# Patient Record
Sex: Male | Born: 1955 | Race: Black or African American | Hispanic: No | Marital: Married | Smoking: Never smoker
Health system: Southern US, Community
[De-identification: ages and names within clinical notes are randomized; demographics above are authoritative.]

## PROBLEM LIST (undated history)

## (undated) DIAGNOSIS — T7840XA Allergy, unspecified, initial encounter: Secondary | ICD-10-CM

## (undated) DIAGNOSIS — I351 Nonrheumatic aortic (valve) insufficiency: Secondary | ICD-10-CM

## (undated) DIAGNOSIS — K219 Gastro-esophageal reflux disease without esophagitis: Secondary | ICD-10-CM

## (undated) DIAGNOSIS — I10 Essential (primary) hypertension: Secondary | ICD-10-CM

## (undated) DIAGNOSIS — N189 Chronic kidney disease, unspecified: Secondary | ICD-10-CM

## (undated) DIAGNOSIS — M1990 Unspecified osteoarthritis, unspecified site: Secondary | ICD-10-CM

## (undated) DIAGNOSIS — J329 Chronic sinusitis, unspecified: Secondary | ICD-10-CM

## (undated) DIAGNOSIS — R011 Cardiac murmur, unspecified: Secondary | ICD-10-CM

## (undated) HISTORY — PX: LASIK: SHX215

## (undated) HISTORY — PX: KNEE ARTHROSCOPY: SUR90

## (undated) HISTORY — DX: Allergy, unspecified, initial encounter: T78.40XA

## (undated) HISTORY — PX: COLONOSCOPY: SHX174

## (undated) HISTORY — DX: Gastro-esophageal reflux disease without esophagitis: K21.9

## (undated) HISTORY — DX: Nonrheumatic aortic (valve) insufficiency: I35.1

## (undated) HISTORY — DX: Chronic sinusitis, unspecified: J32.9

---

## 1998-11-09 ENCOUNTER — Encounter: Payer: Self-pay | Admitting: Family Medicine

## 1998-11-09 ENCOUNTER — Ambulatory Visit (HOSPITAL_COMMUNITY): Admission: RE | Admit: 1998-11-09 | Discharge: 1998-11-09 | Payer: Self-pay | Admitting: Family Medicine

## 2013-12-28 ENCOUNTER — Emergency Department (HOSPITAL_BASED_OUTPATIENT_CLINIC_OR_DEPARTMENT_OTHER)
Admission: EM | Admit: 2013-12-28 | Discharge: 2013-12-28 | Disposition: A | Discharge status: Home / Self Care | Payer: Managed Care, Other (non HMO) | Attending: Emergency Medicine | Admitting: Emergency Medicine

## 2013-12-28 ENCOUNTER — Encounter (HOSPITAL_BASED_OUTPATIENT_CLINIC_OR_DEPARTMENT_OTHER): Payer: Self-pay | Admitting: Emergency Medicine

## 2013-12-28 DIAGNOSIS — S61213A Laceration without foreign body of left middle finger without damage to nail, initial encounter: Secondary | ICD-10-CM

## 2013-12-28 DIAGNOSIS — Y929 Unspecified place or not applicable: Secondary | ICD-10-CM | POA: Insufficient documentation

## 2013-12-28 DIAGNOSIS — S61209A Unspecified open wound of unspecified finger without damage to nail, initial encounter: Secondary | ICD-10-CM | POA: Insufficient documentation

## 2013-12-28 DIAGNOSIS — Y939 Activity, unspecified: Secondary | ICD-10-CM | POA: Insufficient documentation

## 2013-12-28 DIAGNOSIS — W260XXA Contact with knife, initial encounter: Secondary | ICD-10-CM | POA: Insufficient documentation

## 2013-12-28 DIAGNOSIS — W261XXA Contact with sword or dagger, initial encounter: Secondary | ICD-10-CM

## 2013-12-28 DIAGNOSIS — Z23 Encounter for immunization: Secondary | ICD-10-CM | POA: Insufficient documentation

## 2013-12-28 MED ORDER — TETANUS-DIPHTH-ACELL PERTUSSIS 5-2.5-18.5 LF-MCG/0.5 IM SUSP
0.5000 mL | Freq: Once | INTRAMUSCULAR | Status: AC
  Administered 2013-12-28: 0.5 mL via INTRAMUSCULAR
  Filled 2013-12-28: qty 0.5

## 2013-12-28 NOTE — ED Notes (Signed)
Pt reports he cut his left middle finger while sharpening a knife

## 2013-12-28 NOTE — ED Provider Notes (Addendum)
CSN: [id number]     Arrival date & time 12/28/13  1800 History   First MD Initiated Contact with Patient 12/28/13 1812     Chief Complaint  Patient presents with  . Laceration   (Consider location/radiation/quality/duration/timing/severity/associated sxs/prior Treatment) Patient is a 58 y.o. male presenting with skin laceration. The history is provided by the patient. No language interpreter was used.  Laceration Location:  Hand Hand laceration location:  L finger Length (cm):  1.5 cm Depth:  Through dermis Quality: straight   Bleeding: controlled   Time since incident:  1 hour Laceration mechanism:  Knife Pain details:    Quality:  Aching   Severity:  Mild   Timing:  Constant   Progression:  Unchanged Foreign body present:  No foreign bodies Relieved by:  Pressure Worsened by:  Nothing tried Ineffective treatments:  None tried Tetanus status:  Unknown   History reviewed. No pertinent past medical history. History reviewed. No pertinent past surgical history. No family history on file. History  Substance Use Topics  . Smoking status: Never Smoker   . Smokeless tobacco: Not on file  . Alcohol Use: Yes     Comment: occasional    Review of Systems  Constitutional: Negative for fever, chills and fatigue.  HENT: Negative for trouble swallowing.   Eyes: Negative for visual disturbance.  Respiratory: Negative for shortness of breath.   Cardiovascular: Negative for chest pain and palpitations.  Gastrointestinal: Negative for nausea, vomiting, abdominal pain and diarrhea.  Genitourinary: Negative for dysuria and difficulty urinating.  Musculoskeletal: Negative for arthralgias and neck pain.  Skin: Positive for wound. Negative for color change.  Neurological: Negative for dizziness and weakness.  Psychiatric/Behavioral: Negative for dysphoric mood.    Allergies  Review of patient's allergies indicates no known allergies.  Home Medications  No current outpatient  prescriptions on file. BP 146/58  Pulse 81  Temp(Src) 98.6 F (37 C) (Oral)  Resp 16  Ht 5\' 10"  (1.778 m)  Wt 210 lb (95.255 kg)  BMI 30.13 kg/m2  SpO2 100% Physical Exam  Nursing note and vitals reviewed. Constitutional: He appears well-developed and well-nourished. No distress.  HENT:  Head: Normocephalic and atraumatic.  Eyes: Conjunctivae are normal.  Neck: Normal range of motion.  Cardiovascular: Normal rate and regular rhythm.  Exam reveals no gallop and no friction rub.   No murmur heard. Pulmonary/Chest: Effort normal and breath sounds normal. He has no wheezes. He has no rales. He exhibits no tenderness.  Musculoskeletal: Normal range of motion.  Full ROM of left middle finger. No obvious deformity.   Neurological: He is alert. Coordination normal.  Sensation intact of distal left middle finger.   Skin: Skin is warm and dry.  1.5cm laceration over PIP joint of left middle finger. No foreign bodies noted.   Psychiatric: He has a normal mood and affect. His behavior is normal.    ED Course  Procedures (including critical care time)   LACERATION REPAIR Performed by: Emilia Beck Authorized by: Emilia Beck Consent: Verbal consent obtained. Risks and benefits: risks, benefits and alternatives were discussed Consent given by: patient Patient identity confirmed: provided demographic data Prepped and Draped in normal sterile fashion Wound explored  Laceration Location: PIP joint of left middle finger  Laceration Length: 1.5 cm  No Foreign Bodies seen or palpated  Anesthesia: local infiltration  Local anesthetic: lidocaine 2% without epinephrine  Anesthetic total: 2 ml  Irrigation method: syringe Amount of cleaning: standard  Skin closure: 4-0 prolene  Number  of sutures: 2  Technique: simple  Patient tolerance: Patient tolerated the procedure well with no immediate complications.   Labs Review Labs Reviewed - No data to display Imaging  Review No results found.  EKG Interpretation   None       MDM   1. Laceration of left middle finger w/o foreign body w/o damage to nail     6:46 PM Laceration repaired without difficulty. Patient will have tetanus shot. Patient advised to return in 10 days for suture removal. No other injuries. No neurovascular compromise of affected finger.    Emilia BeckKaitlyn Marcianne Ozbun, PA-C 12/28/13 1851  Emilia BeckKaitlyn Paulene Tayag, PA-C 01/03/14 1707

## 2013-12-28 NOTE — Discharge Instructions (Signed)
Keep wound clean. Return to the ED in 10 days for suture removal. Refer to attached documents for more information.  °

## 2013-12-29 NOTE — ED Provider Notes (Signed)
Medical screening examination/treatment/procedure(s) were performed by non-physician practitioner and as supervising physician I was immediately available for consultation/collaboration.  EKG Interpretation   None         Junius ArgyleForrest S Wei Newbrough, MD 12/29/13 1308

## 2014-01-03 NOTE — ED Provider Notes (Signed)
Medical screening examination/treatment/procedure(s) were performed by non-physician practitioner and as supervising physician I was immediately available for consultation/collaboration.  EKG Interpretation   None         Sheretha Shadd S Zaheer Wageman, MD 01/03/14 2255 

## 2014-01-10 ENCOUNTER — Emergency Department (HOSPITAL_BASED_OUTPATIENT_CLINIC_OR_DEPARTMENT_OTHER)
Admission: EM | Admit: 2014-01-10 | Discharge: 2014-01-10 | Disposition: A | Discharge status: Home / Self Care | Payer: Managed Care, Other (non HMO) | Attending: Emergency Medicine | Admitting: Emergency Medicine

## 2014-01-10 ENCOUNTER — Encounter (HOSPITAL_BASED_OUTPATIENT_CLINIC_OR_DEPARTMENT_OTHER): Payer: Self-pay | Admitting: Emergency Medicine

## 2014-01-10 ENCOUNTER — Emergency Department (HOSPITAL_BASED_OUTPATIENT_CLINIC_OR_DEPARTMENT_OTHER): Payer: Managed Care, Other (non HMO)

## 2014-01-10 DIAGNOSIS — Z4802 Encounter for removal of sutures: Secondary | ICD-10-CM | POA: Insufficient documentation

## 2014-01-10 DIAGNOSIS — M20029 Boutonniere deformity of unspecified finger(s): Secondary | ICD-10-CM | POA: Insufficient documentation

## 2014-01-10 DIAGNOSIS — M20022 Boutonniere deformity of left finger(s): Secondary | ICD-10-CM

## 2014-01-10 NOTE — ED Notes (Signed)
Pt presents for suture removal in the L middle finger

## 2014-01-10 NOTE — ED Provider Notes (Signed)
CSN: [id number]     Arrival date & time 01/10/14  1630 History   First MD Initiated Contact with Patient 01/10/14 1653     Chief Complaint  Patient presents with  . Suture / Staple Removal     (Consider location/radiation/quality/duration/timing/severity/associated sxs/prior Treatment) HPI Comments: Patient presents for suture removal from the left middle finger. He received sutures on 2/7 after a cut from a knife.  He denies any fevers, bleeding or drainage. He has swelling to the PIP joint at the site of the sutures.  The history is provided by the patient.    History reviewed. No pertinent past medical history. History reviewed. No pertinent past surgical history. History reviewed. No pertinent family history. History  Substance Use Topics  . Smoking status: Never Smoker   . Smokeless tobacco: Not on file  . Alcohol Use: Yes     Comment: occasional    Review of Systems  Constitutional: Negative for activity change and appetite change.  HENT: Negative for congestion.   Respiratory: Negative for cough, chest tightness and shortness of breath.   Cardiovascular: Negative for chest pain.  Gastrointestinal: Negative for nausea, vomiting and abdominal pain.  Genitourinary: Negative for dysuria and hematuria.  Musculoskeletal: Negative for arthralgias and myalgias.  Skin: Positive for wound.  A complete 10 system review of systems was obtained and all systems are negative except as noted in the HPI and PMH.      Allergies  Review of patient's allergies indicates no known allergies.  Home Medications  No current outpatient prescriptions on file. BP 146/68  Pulse 95  Temp(Src) 99.5 F (37.5 C) (Oral)  Resp 20  Ht 5\' 10"  (1.778 m)  Wt 210 lb (95.255 kg)  BMI 30.13 kg/m2  SpO2 99% Physical Exam  Constitutional: He appears well-developed and well-nourished. No distress.  HENT:  Head: Normocephalic and atraumatic.  Mouth/Throat: Oropharynx is clear and moist. No  oropharyngeal exudate.  Eyes: Conjunctivae and EOM are normal. Pupils are equal, round, and reactive to light.  Neck: Normal range of motion. Neck supple.  Cardiovascular: Normal rate, regular rhythm and normal heart sounds.   Pulmonary/Chest: Effort normal and breath sounds normal. No respiratory distress.  Abdominal: Soft. There is no tenderness. There is no rebound and no guarding.  Musculoskeletal: He exhibits edema and tenderness.  There is swelling of the PIP joint of the left middle finger. Sutures are intact without erythema or drainage. Patient is able to flex but cannot fully extend PIP joint. He is unable to flex the DIP joint which is held in hyperextension. This is consistent with the boutonniere deformity.  Neurological: He is alert. No cranial nerve deficit. He exhibits normal muscle tone. Coordination normal.  Skin: Skin is warm.    ED Course  SUTURE REMOVAL Date/Time: 01/10/2014 5:27 PM Performed by: Glynn OctaveANCOUR, Art Levan Authorized by: Glynn OctaveANCOUR, Tanishia Lemaster Consent: Verbal consent obtained. Risks and benefits: risks, benefits and alternatives were discussed Consent given by: patient Time out: Immediately prior to procedure a "time out" was called to verify the correct patient, procedure, equipment, support staff and site/side marked as required. Body area: upper extremity Location details: left long finger Wound Appearance: clean Sutures Removed: 3 Post-removal: dressing applied Facility: sutures placed in this facility Patient tolerance: Patient tolerated the procedure well with no immediate complications.   (including critical care time) Labs Review Labs Reviewed - No data to display Imaging Review Dg Finger Middle Left  01/10/2014   CLINICAL DATA:  Swelling of the left third digit,  prior suture to repair laceration  EXAM: LEFT MIDDLE FINGER 2+V  COMPARISON:  None.  FINDINGS: No bony abnormality is seen. The does appear to be soft tissue swelling particularly over the left  third PIP joint. Joint spaces appear normal. There is flexion of the left third DIP joint indicative of a boutonniere deformity.  IMPRESSION: No acute bony abnormality. Flexion of the left third DIP joint indicates a boutonniere deformity.   Electronically Signed   By: Dwyane Dee M.D.   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 Sutures are removed. There is concern for extensor tendon injury given patient's boutonniere deformity. This was not documented the time of his injury. he denies any additional trauma.  This was discussed with Dr. Melvyn Novas of hand surgery. Will place finger in full extension.  Patient instructed finger will need to be splinted for at least 6 weeks. Dr. Melvyn Novas will see him in followup on 2/24.    Glynn Octave, MD 01/10/14 [phone]

## 2014-01-10 NOTE — Discharge Instructions (Signed)
Suture Removal, Care After Keep your finger straight in the splint until you followup with Dr. Melvyn Novasrtmann on the 24th. He probably has a injury to the tendon in that finger which is why he can't straighten it.  Return to the ED if you develop new or worsening symptoms. Refer to this sheet in the next few weeks. These instructions provide you with information on caring for yourself after your procedure. Your health care provider may also give you more specific instructions. Your treatment has been planned according to current medical practices, but problems sometimes occur. Call your health care provider if you have any problems or questions after your procedure. WHAT TO EXPECT AFTER THE PROCEDURE After your stitches (sutures) are removed, it is typical to have the following:  Some discomfort and swelling in the wound area.  Slight redness in the area. HOME CARE INSTRUCTIONS   If you have skin adhesive strips over the wound area, do not take the strips off. They will fall off on their own in a few days. If the strips remain in place after 14 days, you may remove them.  Change any bandages (dressings) at least once a day or as directed by your health care provider. If the bandage sticks, soak it off with warm, soapy water.  Apply cream or ointment only as directed by your health care provider. If using cream or ointment, wash the area with soap and water 2 times a day to remove all the cream or ointment. Rinse off the soap and pat the area dry with a clean towel.  Keep the wound area dry and clean. If the bandage becomes wet or dirty, or if it develops a bad smell, change it as soon as possible.  Continue to protect the wound from injury.  Use sunscreen when out in the sun. New scars become sunburned easily. SEEK MEDICAL CARE IF:  You have increasing redness, swelling, or pain in the wound.  You see pus coming from the wound.  You have a fever.  You notice a bad smell coming from the wound  or dressing.  Your wound breaks open (edges not staying together). Document Released: 08/02/2001 Document Revised: 08/28/2013 Document Reviewed: 06/19/2013 [facility] Patient Information 2014 [place], [place].

## 2014-07-10 ENCOUNTER — Ambulatory Visit (HOSPITAL_COMMUNITY): Payer: Managed Care, Other (non HMO) | Attending: Cardiovascular Disease | Admitting: Radiology

## 2014-07-10 ENCOUNTER — Other Ambulatory Visit (HOSPITAL_COMMUNITY): Payer: Self-pay | Admitting: Family Medicine

## 2014-07-10 DIAGNOSIS — I351 Nonrheumatic aortic (valve) insufficiency: Secondary | ICD-10-CM

## 2014-07-10 DIAGNOSIS — R011 Cardiac murmur, unspecified: Secondary | ICD-10-CM | POA: Diagnosis present

## 2014-07-10 DIAGNOSIS — I517 Cardiomegaly: Secondary | ICD-10-CM | POA: Insufficient documentation

## 2014-07-10 DIAGNOSIS — I359 Nonrheumatic aortic valve disorder, unspecified: Secondary | ICD-10-CM | POA: Insufficient documentation

## 2014-07-10 HISTORY — DX: Nonrheumatic aortic (valve) insufficiency: I35.1

## 2014-07-10 NOTE — Progress Notes (Signed)
Echocardiogram performed.  

## 2014-07-29 ENCOUNTER — Encounter: Payer: Self-pay | Admitting: *Deleted

## 2014-07-29 DIAGNOSIS — T7840XA Allergy, unspecified, initial encounter: Secondary | ICD-10-CM | POA: Insufficient documentation

## 2014-07-29 DIAGNOSIS — J329 Chronic sinusitis, unspecified: Secondary | ICD-10-CM | POA: Insufficient documentation

## 2014-07-29 DIAGNOSIS — K219 Gastro-esophageal reflux disease without esophagitis: Secondary | ICD-10-CM | POA: Insufficient documentation

## 2014-07-30 ENCOUNTER — Institutional Professional Consult (permissible substitution) (INDEPENDENT_AMBULATORY_CARE_PROVIDER_SITE_OTHER): Payer: Managed Care, Other (non HMO) | Admitting: Surgery

## 2014-07-30 ENCOUNTER — Encounter: Payer: Self-pay | Admitting: Surgery

## 2014-07-30 VITALS — BP 121/71 | HR 75 | Ht 70.0 in | Wt 210.0 lb

## 2014-07-30 DIAGNOSIS — I351 Nonrheumatic aortic (valve) insufficiency: Secondary | ICD-10-CM

## 2014-07-30 DIAGNOSIS — I359 Nonrheumatic aortic valve disorder, unspecified: Secondary | ICD-10-CM

## 2014-08-01 ENCOUNTER — Encounter: Payer: Self-pay | Admitting: Surgery

## 2014-08-01 NOTE — Progress Notes (Signed)
Cardiothoracic Surgery Consultation   PCP is Lolita Patella, MD Referring Provider is Eric Else, MD  Chief Complaint  Patient presents with  . NEW CARDIACIC    EVAL SEVER AORTIC VALVE REGURGATION    HPI:  The patient is a 58 year old gentleman in good health who recently saw Dr. Nicholos Johns for his routine health maintenance exam and was noted to have a murmur. An echocardiogram showed severe AI with a normal LVEF of 60-65%. There was mild LV cavity dilatation with an LV ID ED of 55 mm and an LV ID ES of 33 mm. He denies any symptoms and says that he exercises several days per week with no limitation.  Past Medical History  Diagnosis Date  . Aortic valve regurgitation 07/10/14    ECHO @ [facility]  . GERD (gastroesophageal reflux disease)     CURRENTLY DIET CONTROLLED  . Allergy     ALLERGIC RHINITIS..WORSE SPRING/FALL  . Sinusitis, chronic     History reviewed. No pertinent past surgical history.  Family History  Problem Relation Age of Onset  . Hypertension Mother   . Cancer Father     STOMACH  . Diabetes Daughter   . Cancer Maternal Grandmother     STOMACH  . Heart disease Maternal Grandmother   . Cancer Brother     COLON  . Diabetes Brother     Social History History  Substance Use Topics  . Smoking status: Never Smoker   . Smokeless tobacco: Not on file  . Alcohol Use: Yes     Comment: occasional    No current outpatient prescriptions on file.   No current facility-administered medications for this visit.    No Known Allergies  Review of Systems  Constitutional: Negative for fever, chills, activity change, fatigue and unexpected weight change.  HENT: Negative.   Eyes: Negative.   Respiratory: Negative for cough, chest tightness and shortness of breath.   Cardiovascular: Negative for chest pain, palpitations and leg swelling.  Gastrointestinal: Negative.   Endocrine: Negative.   Genitourinary: Negative.   Musculoskeletal: Negative.    Skin: Negative.   Neurological: Negative.   Hematological: Negative.   Psychiatric/Behavioral: Negative.     BP 121/71  Pulse 75  Ht  (1.778 m)  Wt 210 lb (95.255 kg)  BMI 30.13 kg/m2  SpO2 98% Physical Exam  Constitutional: He is oriented to person, place, and time. He appears well-developed and well-nourished. No distress.  HENT:  Head: Normocephalic and atraumatic.  Mouth/Throat: Oropharynx is clear and moist.  Eyes: EOM are normal. Pupils are equal, round, and reactive to light.  Neck: Normal range of motion. Neck supple. No JVD present. No thyromegaly present.  Cardiovascular: Normal rate, regular rhythm and intact distal pulses.   Murmur heard. 2/6 diastolic murmur of AI loudest at the apex  Pulmonary/Chest: Effort normal and breath sounds normal. No respiratory distress. He has no wheezes. He has no rales.  Abdominal: Soft. Bowel sounds are normal. He exhibits no distension and no mass. There is no tenderness.  Musculoskeletal: He exhibits no edema.  Lymphadenopathy:    He has no cervical adenopathy.  Neurological: He is alert and oriented to person, place, and time. He has normal strength. No cranial nerve deficit or sensory deficit.  Skin: Skin is warm and dry.  Psychiatric: He has a normal mood and affect.     Diagnostic Tests:  [facility] [address] [place], [place] [address] [phone]  ------------------------------------------------------------------- Echocardiography  Patient: Eric [name], Eric [name] MR #: [id number] Study Date: 07/10/2014 Gender: M Age: 75 Height: 177.8 cm Weight: 93.9 kg BSA: 2.18 m^2 Pt. Status: Room:  ATTENDING Kristeen Miss, M.D. SONOGRAPHER Junious Dresser, RDCS ORDERING Eric [name] [id number] Mila Palmer [id number] PERFORMING [facility]  cc:  ------------------------------------------------------------------- LV EF: 60% -  65%  ------------------------------------------------------------------- Indications: Murmur 785.2.  ------------------------------------------------------------------- History: PMH: Acquired from the patient and from the patient&'s chart. 2/6 Systolic murmur at left sternal border with radiation to axilla. Risk factors: Obese. Dyslipidemia.  ------------------------------------------------------------------- Study Conclusions  - Left ventricle: The cavity size was moderately dilated. Wall thickness was increased in a pattern of moderate LVH. Systolic function was normal. The estimated ejection fraction was in the range of 60% to 65%. - Aortic valve: There was severe regurgitation. Mean gradient (S): 11 mm Hg. Peak gradient (S): 20 mm Hg. - Left atrium: The atrium was mildly dilated. - Pulmonary arteries: Systolic pressure was mildly increased. PA peak pressure: 37 mm Hg (S).  Echocardiography. M-mode, complete 2D, spectral Doppler, and color Doppler. Birthdate: Patient birthdate: [birth year]. Age: Patient is 58 yr old. Sex: Gender: male. BMI: 29.7 kg/m^2. Blood pressure: 120/75 Patient status: Outpatient. Study date: Study date: 07/10/2014. Study time: 02:35 PM. Location: [facility]  -------------------------------------------------------------------  ------------------------------------------------------------------- Left ventricle: The cavity size was moderately dilated. Wall thickness was increased in a pattern of moderate LVH. Systolic function was normal. The estimated ejection fraction was in the range of 60% to 65%.  ------------------------------------------------------------------- Aortic valve: Structurally normal valve. Cusp separation was normal. Doppler: Transvalvular velocity was within the normal range. There was no stenosis. There was severe regurgitation. VTI ratio of LVOT to aortic valve: 0.6. Valve area (VTI): 2.49 cm^2. Indexed valve area (VTI):  1.14 cm^2/m^2. Peak velocity ratio of LVOT to aortic valve: 0.59. Valve area (Vmax): 2.46 cm^2. Indexed valve area (Vmax): 1.13 cm^2/m^2. Mean velocity ratio of LVOT to aortic valve: 0.62. Valve area (Vmean): 2.57 cm^2. Indexed valve area (Vmean): 1.18 cm^2/m^2. Mean gradient (S): 11 mm Hg. Peak gradient (S): 20 mm Hg.  ------------------------------------------------------------------- Aorta: Aortic root: The aortic root was normal in size. Ascending aorta: The ascending aorta was mildly dilated.  ------------------------------------------------------------------- Mitral valve: Doppler: Peak gradient (D): 4 mm Hg.  ------------------------------------------------------------------- Left atrium: The atrium was mildly dilated.  ------------------------------------------------------------------- Right ventricle: The cavity size was normal. Systolic function was normal.  ------------------------------------------------------------------- Pulmonic valve: Structurally normal valve. Cusp separation was normal. Doppler: Transvalvular velocity was within the normal range. There was no regurgitation.  ------------------------------------------------------------------- Tricuspid valve: Structurally normal valve. Leaflet separation was normal. Doppler: Transvalvular velocity was within the normal range. There was trivial regurgitation.  ------------------------------------------------------------------- Pulmonary artery: Systolic pressure was mildly increased.  ------------------------------------------------------------------- Right atrium: The atrium was normal in size.  ------------------------------------------------------------------- Pericardium: There was no pericardial effusion.  ------------------------------------------------------------------- Measurements  Left ventricle Value Reference LV ID, ED, PLAX chordal (H) 54.7 mm 43 - 52 LV ID, ES, PLAX chordal 33 mm 23 - 38 LV  fx shortening, PLAX chordal 40 % >=29 LV PW thickness, ED 13.1 mm --------- IVS/LV PW ratio, ED 1.11 <=1.3 Stroke volume, 2D 102 ml --------- Stroke volume/bsa, 2D 47 ml/m^2 --------- LV ejection fraction, 1-p A4C 58 % --------- LV end-diastolic volume, 2-p 232 ml --------- LV end-systolic volume, 2-p 99 ml --------- LV ejection fraction, 2-p 57 % --------- Stroke volume, 2-p 133 ml --------- LV end-diastolic volume/bsa, 2-p 107 ml/m^2 --------- LV end-systolic volume/bsa, 2-p 45 ml/m^2 --------- Stroke volume/bsa, 2-p 61.1 ml/m^2 --------- LV e&',  lateral 9.55 cm/s --------- LV E/e&', lateral 9.87 --------- LV e&', medial 8.58 cm/s --------- LV E/e&', medial 10.99 --------- LV e&', average 9.07 cm/s --------- LV E/e&', average 10.4 ---------  Ventricular septum Value Reference IVS thickness, ED 14.5 mm ---------  LVOT Value Reference LVOT ID, S 23 mm --------- LVOT area 4.15 cm^2 --------- LVOT ID 23 mm --------- LVOT peak velocity, S 131 cm/s --------- LVOT mean velocity, S 93 cm/s --------- LVOT VTI, S 24.5 cm --------- LVOT peak gradient, S 7 mm Hg --------- Stroke volume (SV), LVOT DP 101.8 ml --------- Stroke index (SV/bsa), LVOT DP 46.8 ml/m^2 ---------  Aortic valve Value Reference Aortic valve peak velocity, S 221 cm/s --------- Aortic valve mean velocity, S 150 cm/s --------- Aortic valve VTI, S 40.9 cm --------- Aortic mean gradient, S 11 mm Hg --------- Aortic peak gradient, S 20 mm Hg --------- VTI ratio, LVOT/AV 0.6 --------- Aortic valve area, VTI 2.49 cm^2 --------- Aortic valve area/bsa, VTI 1.14 cm^2/m^2 --------- Velocity ratio, peak, LVOT/AV 0.59 --------- Aortic valve area, peak velocity 2.46 cm^2 --------- Aortic valve area/bsa, peak 1.13 cm^2/m^2 --------- velocity Velocity ratio, mean, LVOT/AV 0.62 --------- Aortic valve area, mean velocity 2.57 cm^2 --------- Aortic valve area/bsa, mean 1.18 cm^2/m^2 --------- velocity Aortic regurg pressure  half-time 169 ms ---------  Aorta Value Reference Aortic root ID, ED 35 mm --------- Ascending aorta ID, A-P, S 41 mm ---------  Left atrium Value Reference LA ID, A-P, ES 44 mm --------- LA ID/bsa, A-P 2.02 cm/m^2 <=2.2  Mitral valve Value Reference Mitral E-wave peak velocity 94.3 cm/s --------- Mitral A-wave peak velocity 65.6 cm/s --------- Mitral deceleration time 169 ms 150 - 230 Mitral peak gradient, D 4 mm Hg --------- Mitral E/A ratio, peak 1.4 ---------  Pulmonary arteries Value Reference PA pressure, S, DP (H) 37 mm Hg <=30  Tricuspid valve Value Reference Tricuspid regurg peak velocity 291 cm/s --------- Tricuspid peak RV-RA gradient 34 mm Hg --------- Tricuspid maximal regurg 291 cm/s --------- velocity, PISA  Systemic veins Value Reference Estimated CVP 3 mm Hg ---------  Right ventricle Value Reference RV pressure, S, DP (H) 37 mm Hg <=30 RV s&', lateral, S 20 cm/s ---------  Legend: (L) and (H) mark values outside specified reference range.  ------------------------------------------------------------------- Prepared and Electronically Authenticated by  Kristeen Miss, M.D. 2015-08-20T17:31:23   Impression:  He has asymptomatic severe AI with a normal LVEF and mild LV cavity dilatation with an end diastolic dimension of 55 mm and a normal end systolic dimension. The valve appears to be tricuspid although it is not possible to tell why the valve is leaking. There is mild dilatation of the ascending aorta at 41 mm. I reviewed all of the symptoms and signs of severe AI with him and he absolutely denies any and is still very active. According to the [facility] 2010 clinical guidelines he has stage C AI with a normal EF > 50%, a LVESD< 50 mm, and a LVEDD< 65 mm and AVR is probably not indicated at this time. He does require close monitoring for development of any symptoms that would indicate a need for AVR and should have an echo every 6 months to assess his LVEF and  LV dimensions. I discussed all of this with him and reviewed the signs and symptoms of severe AI. I told him that I thought he should be followed closely by a cardiologist and I would be happy to see him back if there is any clinical change.  Plan:  I have referred  him to Dr. Verdis Prime for cardiology evaluation.

## 2014-08-05 ENCOUNTER — Ambulatory Visit: Payer: Managed Care, Other (non HMO) | Admitting: Cardiovascular Disease

## 2014-09-12 ENCOUNTER — Ambulatory Visit (INDEPENDENT_AMBULATORY_CARE_PROVIDER_SITE_OTHER): Payer: Managed Care, Other (non HMO) | Admitting: Cardiology

## 2014-09-12 ENCOUNTER — Encounter: Payer: Self-pay | Admitting: Cardiology

## 2014-09-12 VITALS — BP 132/60 | HR 74 | Ht 70.0 in | Wt 211.0 lb

## 2014-09-12 DIAGNOSIS — I351 Nonrheumatic aortic (valve) insufficiency: Secondary | ICD-10-CM

## 2014-09-12 NOTE — Patient Instructions (Signed)
Your physician recommends that you continue on your current medications as directed. Please refer to the Current Medication list given to you today.  Your physician wants you to follow-up in: 4 month ov  You will receive a reminder letter in the mail two months in advance. If you don't receive a letter, please call our office to schedule the follow-up appointment.   Your physician has requested that you have an echocardiogram. Echocardiography is a painless test that uses sound waves to create images of your heart. It provides your doctor with information about the size and shape of your heart and how well your heart's chambers and valves are working. This procedure takes approximately one hour. There are no restrictions for this procedure. 1 week prior to your 4 month office visit  A chest x-ray takes a picture of the organs and structures inside the chest, including the heart, lungs, and blood vessels. This test can show several things, including, whether the heart is enlarges; whether fluid is building up in the lungs; and whether pacemaker / defibrillator leads are still in place. [facility]

## 2014-09-12 NOTE — Progress Notes (Signed)
Eric [name] Date of Birth:  02/06/1956 [facility] [address] [place], [place]  [id number] [phone]        Fax   [phone]   History of Present Illness: This pleasant 58 year old African American gentleman is seen at the request of Dr. Laneta SimmersBartle.  He is a medical patient of Dr. Elias Elseobert Reade.  He is seen for aortic valve insufficiency. The patient has been in good general health all of his life.  He takes no medication whatsoever.  He went for a routine annual physical examination and August 2015.  Dr. Nicholos Johnseade noted a harsh basilar murmur.  The patient had an echocardiogram on 07/10/14 which showed clear aortic valve regurgitation.  He had a normal left ventricular ejection fraction of 60-65%.  He had a left ventricular end-diastolic dimension of 55 and a left ventricular end systolic dimension of 33.  The descending aorta was mildly dilated and the aortic root was normal in size.  The mitral valve, pulmonic valve and tricuspid valves were structurally normal. The patient denies any cardiac symptoms.  He exercises regularly.  He denies any shortness of breath dizziness palpitations or chest pain.  He does not have any history of high blood pressure.  There is no history of rheumatic fever. His family history is negative for valvular heart disease. 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  He has Engineer, wateradvanced engineer degrees from [employer] and a MBA from [employer]. The patient does not smoke.  He rarely drinks any alcohol.  No current outpatient prescriptions on file.   No current facility-administered medications for this visit.    No Known Allergies  Patient Active Problem List   Diagnosis Date Noted  . GERD (gastroesophageal reflux disease)   . Allergy   . Sinusitis, chronic   . Aortic valve regurgitation 07/10/2014    History  Smoking status  . Never Smoker   Smokeless tobacco  . Not on  file    History  Alcohol Use  . Yes    Comment: occasional    Family History  Problem Relation Age of Onset  . Hypertension Mother   . Cancer Father     STOMACH  . Diabetes Daughter   . Cancer Maternal Grandmother     STOMACH  . Heart disease Maternal Grandmother   . Cancer Brother     COLON  . Diabetes Brother     Review of Systems: Constitutional: no fever chills diaphoresis or fatigue or change in weight.  Head and neck: no hearing loss, no epistaxis, no photophobia or visual disturbance. Respiratory: No cough, shortness of breath or wheezing. Cardiovascular: No chest pain peripheral edema, palpitations. Gastrointestinal: No abdominal distention, no abdominal pain, no change in bowel habits hematochezia or melena. Genitourinary: No dysuria, no frequency, no urgency, no nocturia. Musculoskeletal:No arthralgias, no back pain, no gait disturbance or myalgias. Neurological: No dizziness, no headaches, no numbness, no seizures, no syncope, no weakness, no tremors. Hematologic: No lymphadenopathy, no easy bruising. Psychiatric: No confusion, no hallucinations, no sleep disturbance.    Physical Exam: Filed Vitals:   09/12/14 1552  BP: 132/60  Pulse: 74  The patient appears to be in no distress.  Head and neck exam reveals that the pupils are equal and reactive.  The extraocular movements are full.  There is no scleral icterus.  Mouth and pharynx are benign.  No lymphadenopathy.  No carotid bruits.  The jugular  venous pressure is normal.  Thyroid is not enlarged or tender.  Chest is clear to percussion and auscultation.  No rales or rhonchi.  Expansion of the chest is symmetrical.  Heart reveals no abnormal lift or heave.  First and second heart sounds are normal.  There is a loud grade 3/6 decrescendo diastolic murmur of aortic insufficiency heard at the base and left sternal edge.  There is also a soft flow murmur in systole across the aortic valve.  There is no gallop or  rub  The abdomen is soft and nontender.  Bowel sounds are normoactive.  There is no hepatosplenomegaly or mass.  There are no abdominal bruits.  Extremities reveal no phlebitis or edema.  Pedal pulses are good.  There is no cyanosis or clubbing.  Neurologic exam is normal strength and no lateralizing weakness.  No sensory deficits.  Integument reveals no rash  EKG today shows normal sinus rhythm with biatrial enlargement and a pattern of left ventricular hypertrophy without strain   Assessment / Plan: 1. severe aortic insufficiency by echocardiogram.  No cardiac symptoms. 2. good general health  Disposition: We will plan to follow closely at 6 month intervals with echocardiogram and clinical exam.  He will be a candidate for aortic valve replacement if he starts to have symptoms or if his left ventricular function begins to fall below 50% or his left ventricular end-diastolic dimension increases to 65 mm or his left ventricular end-systolic dimension increases to 50 mm. We will also get a baseline chest x-ray to look at his heart size and shape. His last echocardiogram was in August.  I will plan to see him at the end of February for another echo followed by an office visit and clinical exam. Many thanks for the opportunity to participate in this pleasant gentleman's care.

## 2015-01-20 ENCOUNTER — Ambulatory Visit (HOSPITAL_COMMUNITY): Payer: Managed Care, Other (non HMO) | Attending: Cardiology | Admitting: Radiology

## 2015-01-20 ENCOUNTER — Other Ambulatory Visit: Payer: Self-pay

## 2015-01-20 ENCOUNTER — Ambulatory Visit
Admission: RE | Admit: 2015-01-20 | Discharge: 2015-01-20 | Disposition: A | Discharge status: Home / Self Care | Payer: Managed Care, Other (non HMO) | Source: Ambulatory Visit | Attending: Cardiology | Admitting: Cardiology

## 2015-01-20 DIAGNOSIS — I351 Nonrheumatic aortic (valve) insufficiency: Secondary | ICD-10-CM

## 2015-01-20 NOTE — Progress Notes (Signed)
Echocardiogram performed.  

## 2015-02-06 ENCOUNTER — Encounter: Payer: Self-pay | Admitting: Cardiology

## 2015-02-06 ENCOUNTER — Ambulatory Visit (INDEPENDENT_AMBULATORY_CARE_PROVIDER_SITE_OTHER): Payer: Managed Care, Other (non HMO) | Admitting: Cardiology

## 2015-02-06 VITALS — BP 152/64 | HR 74 | Ht 70.0 in | Wt 212.4 lb

## 2015-02-06 DIAGNOSIS — I351 Nonrheumatic aortic (valve) insufficiency: Secondary | ICD-10-CM

## 2015-02-06 DIAGNOSIS — I10 Essential (primary) hypertension: Secondary | ICD-10-CM

## 2015-02-06 LAB — BASIC METABOLIC PANEL
BUN: 13 mg/dL (ref 6–23)
CO2: 29 meq/L (ref 19–32)
CREATININE: 1.67 mg/dL — AB (ref 0.50–1.35)
Calcium: 9.6 mg/dL (ref 8.4–10.5)
Chloride: 105 mEq/L (ref 96–112)
Glucose, Bld: 94 mg/dL (ref 70–99)
Potassium: 4.6 mEq/L (ref 3.5–5.3)
Sodium: 141 mEq/L (ref 135–145)

## 2015-02-06 NOTE — Patient Instructions (Addendum)
Will obtain labs today and call you with the results (bmet)  Your physician recommends that you continue on your current medications as directed. Please refer to the Current Medication list given to you today.  Your physician recommends that you schedule a follow-up appointment in: 03/06/15 at 2:45

## 2015-02-06 NOTE — Progress Notes (Signed)
Cardiology Office Note   Date:  02/06/2015   ID:  Cash, Duce 07-14-1956, MRN [id number]  PCP:  Lolita Patella, MD  Cardiologist:   Cassell Clement, MD   No chief complaint on file.     History of Present Illness: Eric [name] is a 59 y.o. male who presents for follow-up.  This pleasant 59 year old African American gentleman was initially seen at the request of Dr. Laneta Simmers. He is a medical patient of Dr. Elias Else. He is seen for aortic valve insufficiency. The patient has been in good general health all of his life. He takes no medication whatsoever. He went for a routine annual physical examination and August 2015. Dr. Nicholos Johns noted a harsh basilar murmur. The patient had an echocardiogram on 07/10/14 which showed clear aortic valve regurgitation. He had a normal left ventricular ejection fraction of 60-65%. He had a left ventricular end-diastolic dimension of 55 and a left ventricular end systolic dimension of 33. The descending aorta was mildly dilated and the aortic root was normal in size. The mitral valve, pulmonic valve and tricuspid valves were structurally normal. The patient denies any cardiac symptoms. He exercises regularly. He denies any shortness of breath dizziness palpitations or chest pain. He does not have any history of high blood pressure. There is no history of rheumatic fever. His family history is negative for valvular heart disease. 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 The patient does not smoke. He rarely drinks any alcohol. Since his last visit he has done well.  He had a repeat echocardiogram on 01/20/15.  His end diastolic dimension has increased from 55 to 58 mm.  Similarly, his end-systolic dimension has increased from 33to39 mm.  His ejection fraction is normal at 55-60%. He denies any  chest pain or shortness of breath.  His systolic blood pressure was higher today.  He is on no medications.  Past Medical History  Diagnosis Date  . Aortic valve regurgitation 07/10/14    ECHO @ [facility]  . GERD (gastroesophageal reflux disease)     CURRENTLY DIET CONTROLLED  . Allergy     ALLERGIC RHINITIS..WORSE SPRING/FALL  . Sinusitis, chronic     History reviewed. No pertinent past surgical history.   No current outpatient prescriptions on file.   No current facility-administered medications for this visit.    Allergies:   Review of patient's allergies indicates no known allergies.    Social History:  The patient  reports that he has never smoked. He does not have any smokeless tobacco history on file. He reports that he drinks alcohol.   Family History:  The patient's family history includes Cancer in his brother, father, and maternal grandmother; Diabetes in his brother and daughter; Heart disease in his maternal grandmother; Hypertension in his mother.    ROS:  Please see the history of present illness.   Otherwise, review of systems are positive for none.   All other systems are reviewed and negative.    PHYSICAL EXAM: VS:  BP 152/64 mmHg  Pulse 74  Ht  (1.778 m)  Wt 212 lb 6.4 oz (96.344 kg)  BMI 30.48 kg/m2 , BMI Body mass index is 30.48 kg/(m^2). GEN: Well nourished, well developed, in no acute distress HEENT: normal Neck: no JVD, carotid bruits, or masses Cardiac: RRR; grade 3/6 decrescendo murmur of aortic insufficiency  heard widely across precordium Respiratory:  clear to auscultation bilaterally, normal work of breathing GI: soft, nontender, nondistended, + BS MS: no deformity or atrophy Skin: warm and dry, no rash Neuro:  Strength and sensation are intact Psych: euthymic mood, full affect   EKG:  EKG is not ordered today.   Recent Labs: 02/06/2015: BUN 13; Creatinine 1.67*; Potassium 4.6; Sodium 141    Lipid Panel No results found  for: CHOL, TRIG, HDL, CHOLHDL, VLDL, LDLCALC, LDLDIRECT    Wt Readings from Last 3 Encounters:  02/06/15 212 lb 6.4 oz (96.344 kg)  09/12/14 211 lb (95.709 kg)  07/30/14 210 lb (95.255 kg)         ASSESSMENT AND PLAN:  1.  Moderate to severe aortic insufficiency.  He remains asymptomatic.  Criteria for surgery were reviewed with the patient today.  If his end-diastolic dimension reaches 65 or his end-systolic dimension reaches 50 or his ejection fraction falls below 50%, we would consider surgery even if asymptomatic. 2.  Systolic hypertension  Disposition: We are going to check a basal metabolic panel today.  If renal function is normal we will consider adding an ARB for afterload reduction for his aortic insufficiency and his systolic hypertension.  He will purchase a home blood pressure monitor.       Labs/ tests ordered today include: Basal metabolic panel   Orders Placed This Encounter  Procedures  . Basic metabolic panel     Return in one month for follow-up office visit.  This will be to be sure that he is doing okay on the anticipated addition of losartan. In about 6 months we will plan to do another echocardiogram.   Signed, Cassell Clementhomas Jayme Mednick, MD  02/06/2015 5:25 PM    [facility] [address], [place], [place]  [address] Phone: [phone]; Fax: [phone]

## 2015-02-09 ENCOUNTER — Telehealth: Payer: Self-pay | Admitting: *Deleted

## 2015-02-09 MED ORDER — AMLODIPINE BESYLATE 5 MG PO TABS: 5.0000 mg | ORAL_TABLET | Freq: Every day | ORAL | Status: DC

## 2015-02-09 NOTE — Telephone Encounter (Signed)
-----   Message from Cassell Clementhomas [name], MD sent at 02/08/2015  5:34 PM EDT ----- Please report. The BMET shows elevated creatinine so we will not start losartan at this time. For his kidneys drink plenty of water. For his systolic hypertension start amlodipine 5 mg daily.  Check BPs at home as we discussed. Send copy of labs to Dr Nicholos Johnseade.

## 2015-02-09 NOTE — Telephone Encounter (Signed)
Advised patient

## 2015-02-18 ENCOUNTER — Telehealth: Payer: Self-pay | Admitting: Cardiology

## 2015-02-18 DIAGNOSIS — R030 Elevated blood-pressure reading, without diagnosis of hypertension: Secondary | ICD-10-CM

## 2015-02-18 DIAGNOSIS — I10 Essential (primary) hypertension: Secondary | ICD-10-CM

## 2015-02-18 NOTE — Telephone Encounter (Signed)
Pt called re bp readings[facility]

## 2015-02-18 NOTE — Telephone Encounter (Signed)
He has been on the amlodipine about a week. Would continue current meds and continue to drink plenty of water but avoid added salt. Recheck a BMET when he returns in one month. (The systolic may continue  To be slightly high because of the wide pulse pressure associated with aortic insufficiency).

## 2015-02-18 NOTE — Telephone Encounter (Signed)
Spoke with patient and he has been monitoring his blood pressure at home Systolic blood pressure have been consistently running in the 140's with lowest around 124 Will forward to  Dr. Patty SermonsBrackbill for review

## 2015-02-19 NOTE — Telephone Encounter (Signed)
Advised patient

## 2015-02-19 NOTE — Telephone Encounter (Signed)
Left message to call back  

## 2015-03-06 ENCOUNTER — Other Ambulatory Visit (INDEPENDENT_AMBULATORY_CARE_PROVIDER_SITE_OTHER): Payer: Managed Care, Other (non HMO) | Admitting: *Deleted

## 2015-03-06 ENCOUNTER — Encounter: Payer: Self-pay | Admitting: Cardiology

## 2015-03-06 ENCOUNTER — Ambulatory Visit (INDEPENDENT_AMBULATORY_CARE_PROVIDER_SITE_OTHER): Payer: Managed Care, Other (non HMO) | Admitting: Cardiology

## 2015-03-06 VITALS — BP 132/56 | HR 81 | Ht 70.0 in | Wt 214.1 lb

## 2015-03-06 DIAGNOSIS — I10 Essential (primary) hypertension: Secondary | ICD-10-CM | POA: Diagnosis not present

## 2015-03-06 DIAGNOSIS — I351 Nonrheumatic aortic (valve) insufficiency: Secondary | ICD-10-CM

## 2015-03-06 LAB — BASIC METABOLIC PANEL
BUN: 25 mg/dL — ABNORMAL HIGH (ref 6–23)
CALCIUM: 9.5 mg/dL (ref 8.4–10.5)
CO2: 30 mEq/L (ref 19–32)
Chloride: 103 mEq/L (ref 96–112)
Creatinine, Ser: 1.52 mg/dL — ABNORMAL HIGH (ref 0.40–1.50)
GFR: 60.94 mL/min (ref >60.00)
Glucose, Bld: 101 mg/dL — ABNORMAL HIGH (ref 70–99)
POTASSIUM: 4.1 meq/L (ref 3.5–5.1)
SODIUM: 138 meq/L (ref 135–145)

## 2015-03-06 MED ORDER — AMLODIPINE BESYLATE 10 MG PO TABS: 10.0000 mg | ORAL_TABLET | Freq: Every day | ORAL | Status: DC

## 2015-03-06 NOTE — Patient Instructions (Signed)
Will obtain labs today and call you with the results (bmet)  Your physician recommends that you continue on your current medications as directed. Please refer to the Current Medication list given to you today.  Your physician recommends that you schedule a follow-up appointment in: 2 month ov/bmet

## 2015-03-06 NOTE — Progress Notes (Signed)
Cardiology Office Note   Date:  03/06/2015   ID:  Eric [name], DOB [birth year], MRN [id number]  PCP:  Lolita PatellaEADE,ROBERT ALEXANDER, MD  Cardiologist:   Cassell Clementhomas [place] Cawood, MD   No chief complaint on file.     History of Present Illness: Eric [name] is a 59 y.o. male who presents for follow-up office visit  This pleasant 59 year old African American gentleman was initially seen at the request of Dr. Laneta SimmersBartle. He is a medical patient of Dr. Elias Elseobert Reade. He was seen for aortic valve insufficiency. The patient has been in good general health all of his life.  He went for a routine annual physical examination and August 2015. Dr. Nicholos Johnseade noted a harsh basilar murmur. The patient had an echocardiogram on 07/10/14 which showed clear aortic valve regurgitation. He had a normal left ventricular ejection fraction of 60-65%. He had a left ventricular end-diastolic dimension of 55 and a left ventricular end systolic dimension of 33. The descending aorta was mildly dilated and the aortic root was normal in size. The mitral valve, pulmonic valve and tricuspid valves were structurally normal. The patient denies any cardiac symptoms. He exercises regularly. He denies any shortness of breath dizziness palpitations or chest pain. He does not have any history of high blood pressure. There is no history of rheumatic fever. His family history is negative for valvular heart disease. 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 The patient does not smoke. He rarely drinks any alcohol. Since his last visit he has done well. He had a repeat echocardiogram on 01/20/15. His end diastolic dimension has increased from 55 to 58 mm. Similarly, his end-systolic dimension has increased from 33to39 mm. His ejection fraction is normal at 55-60%. At his previous visit 2 months ago  the patient was noted to have elevated blood pressure.  He was started on amlodipine 5 mg daily.  He has not been having any side effects from the amlodipine.  This typically, no peripheral edema.  He brought in his blood pressure machine today which shows that at times his systolic blood pressure is still running high.  Following his last physician visit, we checked daily BMET and his creatinine was slightly elevated at 1.67.  He was not aware of any prior problems with elevated creatinine.  We are checking another BMET today  Past Medical History  Diagnosis Date  . Aortic valve regurgitation 07/10/14    ECHO @ [facility]  . GERD (gastroesophageal reflux disease)     CURRENTLY DIET CONTROLLED  . Allergy     ALLERGIC RHINITIS..WORSE SPRING/FALL  . Sinusitis, chronic     No past surgical history on file.   Current Outpatient Prescriptions  Medication Sig Dispense Refill  . amLODipine (NORVASC) 10 MG tablet Take 1 tablet (10 mg total) by mouth daily. 90 tablet 1   No current facility-administered medications for this visit.    Allergies:   Review of patient's allergies indicates no known allergies.    Social History:  The patient  reports that he has never smoked. He does not have any smokeless tobacco history on file. He reports that he drinks alcohol.   Family History:  The patient's family history includes Cancer in his brother, father, and maternal grandmother; Diabetes in his brother and daughter; Heart disease in his maternal grandmother; Hypertension in his mother.    ROS:  Please see the history of present illness.   Otherwise, review of systems are positive for none.   All other systems are reviewed and negative.    PHYSICAL EXAM: VS:  BP 132/56 mmHg  Pulse 81  Ht  (1.778 m)  Wt 214 lb 1.9 oz (97.124 kg)  BMI 30.72 kg/m2 , BMI Body mass index is 30.72 kg/(m^2). GEN: Well nourished, well developed, in no acute distress HEENT: normal Neck: no JVD, carotid  bruits, or masses Cardiac: RRR; Grade 3/6 decrescendo murmur of aortic insufficiency  Respiratory:  clear to auscultation bilaterally, normal work of breathing GI: soft, nontender, nondistended, + BS MS: no deformity or atrophy Skin: warm and dry, no rash Neuro:  Strength and sensation are intact Psych: euthymic mood, full affect   EKG:  EKG is not ordered today.    Recent Labs: 02/06/2015: BUN 13; Creatinine 1.67*; Potassium 4.6; Sodium 141    Lipid Panel No results found for: CHOL, TRIG, HDL, CHOLHDL, VLDL, LDLCALC, LDLDIRECT    Wt Readings from Last 3 Encounters:  03/06/15 214 lb 1.9 oz (97.124 kg)  02/06/15 212 lb 6.4 oz (96.344 kg)  09/12/14 211 lb (95.709 kg)         ASSESSMENT AND PLAN:  1. Moderate to severe aortic insufficiency. He remains asymptomatic. Criteria for surgery were reviewed with the patient today. If his end-diastolic dimension reaches 65 or his end-systolic dimension reaches 50 or his ejection fraction falls below 50%, we would consider surgery even if asymptomatic. 2. Systolic hypertension.  Partial improvement since starting amlodipine.  He is concerned however that at times this systolic pressure is still running high.  Will increase his amlodipine to 10 mg daily for better blood pressure control     Current medicines are reviewed at length with the patient today.  The patient does not have concerns regarding medicines.  The following changes have been made:  Increase amlodipine to 10 mg daily  Labs/ tests ordered today include:   Orders Placed This Encounter  Procedures  . Basic metabolic panel     Disposition: Increase amlodipine.  Recheck in 2 months for office visit and basal metabolic panel.   Signed, Cassell Clement, MD  03/06/2015 3:41 PM    [facility] [address] [place], [place], [place]  [address] Phone: [phone]; Fax: [phone]

## 2015-05-15 ENCOUNTER — Ambulatory Visit (INDEPENDENT_AMBULATORY_CARE_PROVIDER_SITE_OTHER): Payer: Managed Care, Other (non HMO) | Admitting: Cardiology

## 2015-05-15 ENCOUNTER — Encounter: Payer: Self-pay | Admitting: Cardiology

## 2015-05-15 VITALS — BP 128/58 | HR 82 | Ht 70.0 in | Wt 215.4 lb

## 2015-05-15 DIAGNOSIS — I10 Essential (primary) hypertension: Secondary | ICD-10-CM | POA: Diagnosis not present

## 2015-05-15 DIAGNOSIS — I351 Nonrheumatic aortic (valve) insufficiency: Secondary | ICD-10-CM

## 2015-05-15 MED ORDER — METOPROLOL SUCCINATE ER 25 MG PO TB24: 25.0000 mg | ORAL_TABLET | Freq: Every day | ORAL | Status: DC

## 2015-05-15 NOTE — Progress Notes (Signed)
Cardiology Office Note   Date:  05/15/2015   ID:  Cap, Wernicke 1956-09-19, MRN [id number]  PCP:  Lolita Patella, MD  Cardiologist: Cassell Clement MD  No chief complaint on file.     History of Present Illness: Eric [name] is a 59 y.o. male who presents for follow-up visit.  This pleasant 59 year old African American gentleman was initially seen at the request of Dr. Laneta Simmers. He is a medical patient of Dr. Elias Else. He is being followed for aortic valve insufficiency. The patient has been in good general health all of his life.  He went for a routine annual physical examination and August 2015. Dr. Nicholos Johns noted a harsh basilar murmur. The patient had an echocardiogram on 07/10/14 which showed severe aortic valve regurgitation. He had a normal left ventricular ejection fraction of 60-65%. He had a left ventricular end-diastolic dimension of 55 and a left ventricular end systolic dimension of 33. The descending aorta was mildly dilated and the aortic root was normal in size. The mitral valve, pulmonic valve and tricuspid valves were structurally normal. The patient denies any cardiac symptoms. He exercises regularly. He denies any shortness of breath dizziness palpitations or chest pain. He does not have any history of high blood pressure. There is no history of rheumatic fever. His family history is negative for valvular heart disease. 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 The patient does not smoke. He rarely drinks any alcohol. Since his last visit he has done well. He had a repeat echocardiogram on 01/20/15. His end diastolic dimension has increased from 55 to 58 mm. Similarly, his end-systolic dimension has increased from 33to39 mm. His ejection fraction is normal at 55-60%. At his previous visit 2 months ago  the patient was noted to have elevated blood pressure. He was started on amlodipine 5 mg daily.At his last visit his amlodipine dose was increased to 10 mg daily.  He states that his blood pressures at home are in the 120 range systolic.  However he states that since starting amlodipine he has had a bothersome nonproductive cough.  He has not been having any chest pain or shortness of breath.  Past Medical History  Diagnosis Date  . Aortic valve regurgitation 07/10/14    ECHO @ [facility]  . GERD (gastroesophageal reflux disease)     CURRENTLY DIET CONTROLLED  . Allergy     ALLERGIC RHINITIS..WORSE SPRING/FALL  . Sinusitis, chronic     No past surgical history on file.   Current Outpatient Prescriptions  Medication Sig Dispense Refill  . metoprolol succinate (TOPROL XL) 25 MG 24 hr tablet Take 1 tablet (25 mg total) by mouth daily. 30 tablet 5   No current facility-administered medications for this visit.    Allergies:   Amlodipine    Social History:  The patient  reports that he has never smoked. He does not have any smokeless tobacco history on file. He reports that he drinks alcohol.   Family History:  The patient's family history includes Cancer in his brother, father, and maternal grandmother; Diabetes in his brother and daughter; Heart disease in his maternal grandmother; Hypertension in his mother.    ROS:  Please see the history of present illness.   Otherwise, review of systems are positive for none.   All other systems are reviewed and negative.  PHYSICAL EXAM: VS:  BP 128/58 mmHg  Pulse 82  Ht  (1.778 m)  Wt 215 lb 6.4 oz (97.705 kg)  BMI 30.91 kg/m2 , BMI Body mass index is 30.91 kg/(m^2). GEN: Well nourished, well developed, in no acute distress HEENT: normal Neck: no JVD, carotid bruits, or masses Cardiac: RRR; grade 3/6 decrescendo murmur of aortic insufficiency at left sternal edge.  No peripheral edema. Respiratory:  clear to auscultation  bilaterally, normal work of breathing GI: soft, nontender, nondistended, + BS MS: no deformity or atrophy Skin: warm and dry, no rash Neuro:  Strength and sensation are intact Psych: euthymic mood, full affect   EKG:  EKG is not ordered today.    Recent Labs: 03/06/2015: BUN 25*; Creatinine, Ser 1.52*; Potassium 4.1; Sodium 138    Lipid Panel No results found for: CHOL, TRIG, HDL, CHOLHDL, VLDL, LDLCALC, LDLDIRECT    Wt Readings from Last 3 Encounters:  05/15/15 215 lb 6.4 oz (97.705 kg)  03/06/15 214 lb 1.9 oz (97.124 kg)  02/06/15 212 lb 6.4 oz (96.344 kg)         ASSESSMENT AND PLAN:  1.  1. Moderate to severe aortic insufficiency. He remains asymptomatic. Criteria for surgery were reviewed with the patient today. If his end-diastolic dimension reaches 65 or his end-systolic dimension reaches 50 or his ejection fraction falls below 50%, we would consider surgery even if asymptomatic. 2. Systolic hypertension. His blood pressure control has improved on amlodipine.  However he feels that amlodipine is causing him to have a dry nonproductive cough.  He is not on an ACE inhibitor.  He had a chest x-ray at his last office visit which was unremarkable in terms of his lungs.   Current medicines are reviewed at length with the patient today.  The patient has concerns regarding medicines.  The following changes have been made:  We are stopping amlodipine.  In its place we will start Toprol-XL 25 mg daily.  Labs/ tests ordered today include:   Orders Placed This Encounter  Procedures  . ECHOCARDIOGRAM COMPLETE   Disposition: Recheck in October for office visit.  He will get a follow-up echocardiogram in late September to follow his aortic valve and aortic root.  Stop amlodipine and switch to metoprolol succinate 25 mg daily   Signed, Cassell Clement MD 05/15/2015 5:48 PM    [facility] [address] [place], [place], [place]  [address] Phone: [phone]; Fax: [phone]

## 2015-05-15 NOTE — Patient Instructions (Signed)
Medication Instructions:  STOP AMLODIPINE  START TOPROL XL (METOPROLOL) 25 MG DAILY  Labwork: NONE  Testing/Procedures: Your physician has requested that you have an echocardiogram. Echocardiography is a painless test that uses sound waves to create images of your heart. It provides your doctor with information about the size and shape of your heart and how well your heart's chambers and valves are working. This procedure takes approximately one hour. There are no restrictions for this procedure. IN LATE SEPTEMBER  Follow-Up: Your physician wants you to follow-up in: October AFTER YOUR ECHO  You will receive a reminder letter in the mail two months in advance. If you don't receive a letter, please call our office to schedule the follow-up appointment.

## 2015-08-14 ENCOUNTER — Other Ambulatory Visit: Payer: Self-pay

## 2015-08-14 ENCOUNTER — Ambulatory Visit (HOSPITAL_COMMUNITY): Payer: Managed Care, Other (non HMO) | Attending: Cardiology

## 2015-08-14 DIAGNOSIS — I351 Nonrheumatic aortic (valve) insufficiency: Secondary | ICD-10-CM | POA: Diagnosis not present

## 2015-08-14 DIAGNOSIS — I517 Cardiomegaly: Secondary | ICD-10-CM | POA: Diagnosis not present

## 2015-08-14 DIAGNOSIS — I358 Other nonrheumatic aortic valve disorders: Secondary | ICD-10-CM | POA: Diagnosis present

## 2015-08-14 DIAGNOSIS — K219 Gastro-esophageal reflux disease without esophagitis: Secondary | ICD-10-CM | POA: Diagnosis not present

## 2015-08-14 DIAGNOSIS — I77810 Thoracic aortic ectasia: Secondary | ICD-10-CM | POA: Insufficient documentation

## 2015-08-14 DIAGNOSIS — I340 Nonrheumatic mitral (valve) insufficiency: Secondary | ICD-10-CM | POA: Insufficient documentation

## 2015-08-24 ENCOUNTER — Ambulatory Visit
Admission: RE | Admit: 2015-08-24 | Discharge: 2015-08-24 | Disposition: A | Discharge status: Home / Self Care | Payer: Managed Care, Other (non HMO) | Source: Ambulatory Visit | Attending: Cardiology | Admitting: Cardiology

## 2015-08-24 ENCOUNTER — Ambulatory Visit (INDEPENDENT_AMBULATORY_CARE_PROVIDER_SITE_OTHER): Payer: Managed Care, Other (non HMO) | Admitting: Cardiology

## 2015-08-24 ENCOUNTER — Encounter: Payer: Self-pay | Admitting: Cardiology

## 2015-08-24 VITALS — BP 134/66 | HR 68 | Ht 70.0 in | Wt 209.4 lb

## 2015-08-24 DIAGNOSIS — R05 Cough: Secondary | ICD-10-CM

## 2015-08-24 DIAGNOSIS — R059 Cough, unspecified: Secondary | ICD-10-CM

## 2015-08-24 DIAGNOSIS — I351 Nonrheumatic aortic (valve) insufficiency: Secondary | ICD-10-CM

## 2015-08-24 NOTE — Progress Notes (Signed)
Cardiology Office Note   Date:  08/24/2015   ID:  Eric, [name] [birth year], MRN [id number]  PCP:  Eric Patella, MD  Cardiologist: Cassell Clement MD  No chief complaint on file.     History of Present Illness: Eric [name] is a 58 y.o. male who presents for a six-month follow-up office visit.   This pleasant 59 year old African American gentleman was initially seen at the request of Dr. Laneta Simmers. He is a medical patient of Dr. Elias Else. He is being followed for asymptomatic severe aortic valve insufficiency. The patient has been in good general health all of his life.  He went for a routine annual physical examination and August 2015. Dr. Nicholos Johns noted a harsh basilar murmur. The patient had an echocardiogram on 07/10/14 which showed severe aortic valve regurgitation. He had a normal left ventricular ejection fraction of 60-65%. He had a left ventricular end-diastolic dimension of 55 and a left ventricular end systolic dimension of 33. The ascending aorta was mildly dilated and the aortic root was normal in size. The mitral valve, pulmonic valve and tricuspid valves were structurally normal. The patient denies any cardiac symptoms. He exercises regularly. He denies any shortness of breath dizziness palpitations or chest pain. He does not have any history of high blood pressure. There is no history of rheumatic fever. His family history is negative for valvular heart disease. 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 The patient does not smoke. He rarely drinks any alcohol. Since his last visit he has done well. He had a repeat echocardiogram on 01/20/15. His end diastolic dimension has increased from 55 to 58 mm. Similarly, his end-systolic dimension has increased from 33to39 mm. His ejection fraction is normal at  55-60%.  His most recent echocardiogram done on 08/14/15 shows incremental further increase in his left ventricular end-diastolic and end-systolic dimensions The patient has a history of mild hypertension.  He was initially given amlodipine.  However he developed a side effect of a cough.  His amlodipine was stopped and he was switched to Toprol 25 mg daily.  He states that he is having the same cough from Toprol.  We have not used an Ace or an ARB because of his cough and borderline elevated creatinine.  Past Medical History  Diagnosis Date  . Aortic valve regurgitation 07/10/14    ECHO @ [facility]  . GERD (gastroesophageal reflux disease)     CURRENTLY DIET CONTROLLED  . Allergy     ALLERGIC RHINITIS..WORSE SPRING/FALL  . Sinusitis, chronic     No past surgical history on file.   Current Outpatient Prescriptions  Medication Sig Dispense Refill  . metoprolol succinate (TOPROL XL) 25 MG 24 hr tablet Take 1 tablet (25 mg total) by mouth daily. 30 tablet 5   No current facility-administered medications for this visit.    Allergies:   Amlodipine    Social History:  The patient  reports that he has never smoked. He does not have any smokeless tobacco history on file. He reports that he drinks alcohol.   Family History:  The patient's family history includes Cancer in his brother, father, and maternal grandmother; Diabetes in his brother and daughter; Heart disease in his maternal grandmother; Hypertension in his mother.    ROS:  Please see the history of present illness.   Otherwise, review of systems  are positive for none.   All other systems are reviewed and negative.    PHYSICAL EXAM: VS:  BP 134/66 mmHg  Pulse 68  Ht  (1.778 m)  Wt 209 lb 6.4 oz (94.983 kg)  BMI 30.05 kg/m2 , BMI Body mass index is 30.05 kg/(m^2). GEN: Well nourished, well developed, in no acute distress HEENT: normal Neck: no JVD, carotid bruits, or masses Cardiac: RRR; there is a grade 3/6  decrescendo murmur of aortic insufficiency widely heard across the anterior precordium.No, rubs, or gallops,no edema  Respiratory:  clear to auscultation bilaterally, normal work of breathing GI: soft, nontender, nondistended, + BS MS: no deformity or atrophy Skin: warm and dry, no rash Neuro:  Strength and sensation are intact Psych: euthymic mood, full affect   EKG:  EKG is ordered today. The ekg ordered today demonstrates normal sinus rhythm.  Biatrial enlargement.  Left ventricular hypertrophy.  Since last tracing of 09/12/14, no significant change.  He does not have any T-wave changes to suggest left ventricular strain.   Recent Labs: 03/06/2015: BUN 25*; Creatinine, Ser 1.52*; Potassium 4.1; Sodium 138    Lipid Panel No results found for: CHOL, TRIG, HDL, CHOLHDL, VLDL, LDLCALC, LDLDIRECT    Wt Readings from Last 3 Encounters:  08/24/15 209 lb 6.4 oz (94.983 kg)  05/15/15 215 lb 6.4 oz (97.705 kg)  03/06/15 214 lb 1.9 oz (97.124 kg)         ASSESSMENT AND PLAN:  1. Moderate to severe aortic insufficiency. He remains asymptomatic. Criteria for surgery were reviewed with the patient today. If his end-diastolic dimension reaches 65 or his end-systolic dimension reaches 50 or his ejection fraction falls below 50%, we would consider surgery even if asymptomatic.  The patient had an echocardiogram on 08/14/15.The echo shows that the LV has dilated further. Going back to August 2015, the EDD has increased from 55 to 58 to now 54. The ESD has increased from 33 to 39 to 42. The pulmonary artery pressure is now more elevated 56. The EF is still normal at 55-60%. MR is now moderate. The patient has not been having any symptoms of congestive heart failure.  No chest pain.  No shortness of breath.  He sleeps on one pillow.  He is not experiencing any paroxysmal nocturnal dyspnea or peripheral edema.  He is able to exercise using is elliptical and he also walks about 3-1/2 miles in an  hour in his neighborhood. We discussed the criteria for surgical intervention.  He is approaching that point based upon his end-diastolic dimension and end-systolic dimension despite the fact that he is relatively asymptomatic. 2. Systolic hypertension.Previously he had developed a cough to amlodipine.  He is now having a cough attributed to Toprol.  He does have mild renal insufficiency and so we will not use a ACE inhibitor or ARB at this point which would be more likely to cause a cough as well as.   Current medicines are reviewed at length with the patient today. The patient has no concerns regarding medicines.  The following changes have been made: We will continue with Toprol for now.  Blood pressure is well controlled.  We'll get a chest x-ray to look for other causes of cough as well as ascertain his heart size.     The following changes have been made:  no change  Labs/ tests ordered today include:   Orders Placed This Encounter  Procedures  . DG Chest 2 View  . EKG 12-Lead  Disposition: Continue current medication.  Get chest x-ray today.  Recheck in 6 months, or sooner if he develops any symptoms.  Consider updating his echo at the six-month visit.  Karie Schwalbe MD 08/24/2015 11:19 AM    [facility] [address] [place], [place], [place]  [address] Phone: [phone]; Fax: [phone]

## 2015-08-24 NOTE — Patient Instructions (Signed)
Medication Instructions:  Your physician recommends that you continue on your current medications as directed. Please refer to the Current Medication list given to you today.  Labwork: none  Testing/Procedures: A chest x-ray takes a picture of the organs and structures inside the chest, including the heart, lungs, and blood vessels. This test can show several things, including, whether the heart is enlarges; whether fluid is building up in the lungs; and whether pacemaker / defibrillator leads are still in place. [facility]   Follow-Up: Your physician wants you to follow-up in: 6 months ov with Dawayne Patricia NP and Bing Neighbors PA

## 2015-11-20 ENCOUNTER — Other Ambulatory Visit: Payer: Self-pay | Admitting: Cardiology

## 2015-12-10 ENCOUNTER — Telehealth: Payer: Self-pay | Admitting: Cardiology

## 2015-12-10 DIAGNOSIS — I351 Nonrheumatic aortic (valve) insufficiency: Secondary | ICD-10-CM

## 2015-12-10 DIAGNOSIS — I359 Nonrheumatic aortic valve disorder, unspecified: Secondary | ICD-10-CM

## 2015-12-10 NOTE — Telephone Encounter (Signed)
New Message  Follow Up. Pt called scheduled re est with Katrinka Blazing. Pt req an ECHO at the time of the appt. He states that this is normal for his follow Ups. Please call back to discuss.

## 2015-12-10 NOTE — Telephone Encounter (Signed)
Spoke with patient and advised would discuss with Dr Katrinka Blazing when he is back from vacation.

## 2015-12-11 NOTE — Telephone Encounter (Signed)
Ok to order and schedule per Dr Katrinka Blazing Has been scheduled Patient was concerned may need follow up chest xray at that time as well  Will forward to Dr Katrinka Blazing for review

## 2016-02-19 ENCOUNTER — Other Ambulatory Visit: Payer: Self-pay | Admitting: Cardiology

## 2016-02-23 ENCOUNTER — Ambulatory Visit (HOSPITAL_COMMUNITY): Payer: Managed Care, Other (non HMO) | Attending: Cardiology

## 2016-02-23 ENCOUNTER — Other Ambulatory Visit: Payer: Self-pay

## 2016-02-23 DIAGNOSIS — I517 Cardiomegaly: Secondary | ICD-10-CM | POA: Diagnosis not present

## 2016-02-23 DIAGNOSIS — I351 Nonrheumatic aortic (valve) insufficiency: Secondary | ICD-10-CM | POA: Insufficient documentation

## 2016-02-23 DIAGNOSIS — I340 Nonrheumatic mitral (valve) insufficiency: Secondary | ICD-10-CM | POA: Diagnosis not present

## 2016-02-23 DIAGNOSIS — I359 Nonrheumatic aortic valve disorder, unspecified: Secondary | ICD-10-CM | POA: Insufficient documentation

## 2016-02-26 ENCOUNTER — Encounter: Payer: Self-pay | Admitting: Interventional Cardiology

## 2016-02-26 ENCOUNTER — Ambulatory Visit (INDEPENDENT_AMBULATORY_CARE_PROVIDER_SITE_OTHER): Payer: Managed Care, Other (non HMO) | Admitting: Interventional Cardiology

## 2016-02-26 VITALS — BP 140/68 | HR 88 | Ht 70.0 in | Wt 214.4 lb

## 2016-02-26 DIAGNOSIS — I517 Cardiomegaly: Secondary | ICD-10-CM | POA: Diagnosis not present

## 2016-02-26 DIAGNOSIS — I10 Essential (primary) hypertension: Secondary | ICD-10-CM | POA: Diagnosis not present

## 2016-02-26 DIAGNOSIS — I351 Nonrheumatic aortic (valve) insufficiency: Secondary | ICD-10-CM

## 2016-02-26 NOTE — Progress Notes (Signed)
Cardiology Office Note   Date:  02/26/2016   ID:  Eric, [name] [birth year], MRN [id number]  PCP:  Vena Austria, MD  Cardiologist:  Sinclair Grooms, MD   Chief Complaint  Patient presents with  . Cardiac Valve Problem    Severe aortic regurgitation      History of Present Illness: Eric [name] is a 60 y.o. male who presents for Severe aortic regurgitation.  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 Since that time.a Eric [name] has followed the patient's valve with serial echocardiography. The patient initially saw Dr. Arvid Right. He did not feel that the patient met criteria for surgery at that time.  The patient is not very active. He denies orthopnea, PND, and lower extremity swelling. The wife is concerned that he seems tired all the time and wants to sleep. He does not appear to be very short of breath or fatigued with physical activity when he engages. He denies chest pain. No history of any other type of vascular disease.    Past Medical History  Diagnosis Date  . Aortic valve regurgitation 07/10/14    ECHO @ [facility]  . GERD (gastroesophageal reflux disease)     CURRENTLY DIET CONTROLLED  . Allergy     ALLERGIC RHINITIS..WORSE SPRING/FALL  . Sinusitis, chronic     No past surgical history on file.   Current Outpatient Prescriptions  Medication Sig Dispense Refill  . metoprolol succinate (TOPROL-XL) 25 MG 24 hr tablet TAKE 1 TABLET(25 MG) BY MOUTH DAILY 90 tablet 3   No current facility-administered medications for this visit.    Allergies:   Amlodipine    Social History:  The patient  reports that he has never smoked. He has never used smokeless tobacco. He reports that he drinks alcohol.   Family History:  The patient's family history includes Cancer in his brother, father, and maternal grandmother; Diabetes in his brother and daughter; Heart disease in his maternal grandmother; Hypertension in  his mother.    ROS:  Please see the history of present illness.   Otherwise, review of systems are positive for Does not smoke or drink. Excessive daytime sleepiness. Snores somewhat but wife states that he does not stop breathing.   All other systems are reviewed and negative.    PHYSICAL EXAM: VS:  BP 140/68 mmHg  Pulse 88  Ht 5' 10"  (1.778 m)  Wt 214 lb 6.4 oz (97.251 kg)  BMI 30.76 kg/m2 , BMI Body mass index is 30.76 kg/(m^2). GEN: Well nourished, well developed, in no acute distress HEENT: normal Neck: no JVD, carotid bruits, or masses Cardiac: RRR.  There is a 3/6 decrescendo harsh murmur of aortic regurgitation. No obvious gallop is heard. There is no rub, or gallop. There is no edema. Respiratory:  clear to auscultation bilaterally, normal work of breathing. GI: soft, nontender, nondistended, + BS MS: no deformity or atrophy Skin: warm and dry, no rash Neuro:  Strength and sensation are intact Psych: euthymic mood, full affect   EKG:  EKG is not ordered today.  Echocardiogram summary:      LVEDD (mm)                LVESD          EF  06/2014  54.7                              33                 60-65 % 01/2015                                            58.4                              39.2              55-60 07/2015                                            63                                 42                 55-60 02/2016                                            59.4                              41.6              60-65   Recent Labs: 03/06/2015: BUN 25*; Creatinine, Ser 1.52*; Potassium 4.1; Sodium 138    Lipid Panel No results found for: CHOL, TRIG, HDL, CHOLHDL, VLDL, LDLCALC, LDLDIRECT    Wt Readings from Last 3 Encounters:  02/26/16 214 lb 6.4 oz (97.251 kg)  08/24/15 209 lb 6.4 oz (94.983 kg)  05/15/15 215 lb 6.4 oz (97.705 kg)      Other studies Reviewed: Additional studies/ records that were reviewed today  include: All echoes were reviewed. The findings include see above systolic and diastolic parameters.    ASSESSMENT AND PLAN: 1. Severe aortic valve regurgitation Severe all repeat echo evaluation. LV size is relatively stable compared to prior evaluation. Current LV end-systolic dimension is 44.3 mm and the LV and diastolic dimension is 15.4 mm. If he remains asymptomatic, symmetric for surgery is 50 and 65 mm respectively - Echocardiogram; Future  2. Systolic hypertension Adequate systolic blood pressure control - Echocardiogram; Future  3. LVE (left ventricular enlargement) Stable most recent echo - Echocardiogram; Future     Current medicines are reviewed at length with the patient today.  The patient has the following concerns regarding medicines: We discussed beta blocker therapy. It does not appear to be doing very much at this time. Significant increases in doses which lower the heart rate will cause LV dilatation to occur more quickly..  The following changes/actions have been instituted:    Long discussion concerning Metrix required for surgery based upon echo. Also discussed the importance of being unable to gauge whether or not the patient is slowly developing symptoms.  Encouraged mild to moderate aerobic activity. Asked  to avoid moderate or heavy isometric activity.  2-D Doppler echocardiogram in 6 months.  We discussed mechanical versus tissue valves.  We discussed standard open aortic valve replacement versus minimally invasive surgery.  Labs/ tests ordered today include:  Orders Placed This Encounter  Procedures  . Echocardiogram     Disposition:   FU with HS in 6 months  Signed, Sinclair Grooms, MD  02/26/2016 5:07 PM    [facility] [address], [place], [place]  [address] Phone: [phone]; Fax: [phone]

## 2016-02-26 NOTE — Patient Instructions (Signed)
Medication Instructions:  Your physician recommends that you continue on your current medications as directed. Please refer to the Current Medication list given to you today.   Labwork: None   Testing/Procedures: Your physician has requested that you have an echocardiogram. Echocardiography is a painless test that uses sound waves to create images of your heart. It provides your doctor with information about the size and shape of your heart and how well your heart's chambers and valves are working. This procedure takes approximately one hour. There are no restrictions for this procedure.  In 6 months a few days before you see Dr Katrinka BlazingSmith    Follow-Up: Your physician wants you to follow-up in: 6 months with Dr Katrinka BlazingSmith. (October 2017). You will receive a reminder letter in the mail two months in advance. If you don't receive a letter, please call our office to schedule the follow-up appointment.      If you need a refill on your cardiac medications before your next appointment, please call your pharmacy.

## 2016-09-05 ENCOUNTER — Ambulatory Visit (HOSPITAL_COMMUNITY): Payer: Managed Care, Other (non HMO) | Attending: Cardiology

## 2016-09-05 ENCOUNTER — Other Ambulatory Visit: Payer: Self-pay

## 2016-09-05 DIAGNOSIS — I119 Hypertensive heart disease without heart failure: Secondary | ICD-10-CM | POA: Diagnosis not present

## 2016-09-05 DIAGNOSIS — I77810 Thoracic aortic ectasia: Secondary | ICD-10-CM | POA: Insufficient documentation

## 2016-09-05 DIAGNOSIS — I10 Essential (primary) hypertension: Secondary | ICD-10-CM

## 2016-09-05 DIAGNOSIS — Z8249 Family history of ischemic heart disease and other diseases of the circulatory system: Secondary | ICD-10-CM | POA: Diagnosis not present

## 2016-09-05 DIAGNOSIS — I340 Nonrheumatic mitral (valve) insufficiency: Secondary | ICD-10-CM | POA: Diagnosis not present

## 2016-09-05 DIAGNOSIS — I517 Cardiomegaly: Secondary | ICD-10-CM | POA: Diagnosis not present

## 2016-09-05 DIAGNOSIS — I359 Nonrheumatic aortic valve disorder, unspecified: Secondary | ICD-10-CM | POA: Diagnosis present

## 2016-09-05 DIAGNOSIS — I351 Nonrheumatic aortic (valve) insufficiency: Secondary | ICD-10-CM | POA: Diagnosis not present

## 2016-09-08 ENCOUNTER — Ambulatory Visit (INDEPENDENT_AMBULATORY_CARE_PROVIDER_SITE_OTHER): Payer: Managed Care, Other (non HMO) | Admitting: Interventional Cardiology

## 2016-09-08 ENCOUNTER — Encounter (INDEPENDENT_AMBULATORY_CARE_PROVIDER_SITE_OTHER): Payer: Self-pay

## 2016-09-08 ENCOUNTER — Encounter: Payer: Self-pay | Admitting: Interventional Cardiology

## 2016-09-08 VITALS — BP 132/60 | HR 66 | Ht 70.0 in | Wt 221.2 lb

## 2016-09-08 DIAGNOSIS — I351 Nonrheumatic aortic (valve) insufficiency: Secondary | ICD-10-CM

## 2016-09-08 DIAGNOSIS — I10 Essential (primary) hypertension: Secondary | ICD-10-CM

## 2016-09-08 MED ORDER — VALSARTAN 80 MG PO TABS: 80.0000 mg | ORAL_TABLET | Freq: Every day | ORAL | 3 refills | Status: DC

## 2016-09-08 NOTE — Progress Notes (Signed)
Cardiology Office Note    Date:  09/08/2016   ID:  Eric [name] 04-16-1956, MRN [id number]  PCP:  Lolita Patella, MD  Cardiologist: Lesleigh Noe, MD   Chief Complaint  Patient presents with  . Follow-up    Hypertension    History of Present Illness:  Eric [name] is a 60 y.o. male who presents for severe aortic regurgitation and aortic root enlargement.  Mr. Eric [name] is doing well. He is an Art gallery manager. He denies chest pain. No orthopnea or PND. He does aerobic activity several times a week. He works in his yard. He denies limitations. Specifically no orthopnea, PND, exertional dyspnea or fatigue. No prolonged palpitations. No episodes of syncope.       Past Medical History:  Diagnosis Date  . Allergy    ALLERGIC RHINITIS..WORSE SPRING/FALL  . Aortic valve regurgitation 07/10/14   ECHO @ [facility]  . GERD (gastroesophageal reflux disease)    CURRENTLY DIET CONTROLLED  . Sinusitis, chronic     History reviewed. No pertinent surgical history.  Current Medications: Outpatient Medications Prior to Visit  Medication Sig Dispense Refill  . metoprolol succinate (TOPROL-XL) 25 MG 24 hr tablet TAKE 1 TABLET(25 MG) BY MOUTH DAILY 90 tablet 3   No facility-administered medications prior to visit.      Allergies:   Amlodipine   Social History   Social History  . Marital status: Married    Spouse name: N/A  . Number of children: N/A  . Years of education: N/A   Social History Main Topics  . Smoking status: Never Smoker  . Smokeless tobacco: Never Used  . Alcohol use 0.0 oz/week     Comment: occasional  . Drug use: Unknown  . Sexual activity: Not Asked   Other Topics Concern  . None   Social History Narrative  . None     Family History:  The patient's family history includes Cancer in his brother, father, and maternal grandmother; Diabetes in his brother and daughter; Heart disease in his maternal grandmother; Hypertension in his mother.    ROS:   Please see the history of present illness.    None stated  All other systems reviewed and are negative.   PHYSICAL EXAM:   VS:  BP 132/60   Pulse 66   Ht 5\' 10"  (1.778 m)   Wt 221 lb 3.2 oz (100.3 kg)   SpO2 95%   BMI 31.74 kg/m    GEN: Well nourished, well developed, in no acute distress  HEENT: normal  Neck: no JVD, carotid bruits, or masses Cardiac: RRR; 3-4/6 holodiastolic decrescendo murmur of aortic regurgitation. No [place] murmur is heard. Soft, rubs, or gallops,no edema  Respiratory:  clear to auscultation bilaterally, normal work of breathing GI: soft, nontender, nondistended, + BS MS: no deformity or atrophy  Skin: warm and dry, no rash Neuro:  Alert and Oriented x 3, Strength and sensation are intact Psych: euthymic mood, full affect  Wt Readings from Last 3 Encounters:  09/08/16 221 lb 3.2 oz (100.3 kg)  02/26/16 214 lb 6.4 oz (97.3 kg)  08/24/15 209 lb 6.4 oz (95 kg)      Studies/Labs Reviewed:   EKG:  EKG  Biatrial abnormality. Voltage criteria for LVH. No change when compared to prior.  Recent Labs: No results found for requested labs within last 8760 hours.   Lipid Panel No results found for: CHOL, TRIG, HDL, CHOLHDL, VLDL, LDLCALC, LDLDIRECT  Additional studies/ records that  were reviewed today include:  Review of echo data from the past 2 years:  2015: LVEF 60-65%; LVID-ED - 55 mm; LVID-ES - 33 mm 04/17: LVEF 60-65%; LVID-ED - 59 mm; LVID-ES - 41 mm 10/17: LVEF 55-60%; LVID-ED - 59.1 mm; LVID-ES - 38 mm    ASSESSMENT:    1. Nonrheumatic aortic valve insufficiency   2. Systolic hypertension      PLAN:  In order of problems listed above:  1. Wean and DC beta blocker over the next 2 weeks. Start Diovan 80 mg per day. This will be better at slowing LV enlargement. Beta blocker therapy simply allows a longer diastolic time which will increase aortic regurgitation volume. 2-4 weeks after starting Diovan return for office visit  for blood pressure reassessment and basic metabolic panel. See Dr. Katrinka BlazingSmith in 6 months. Next echo will be done in one year. We spent considerable time discussing the parameters that are followed in setting of severe aortic regurgitation including the end-systolic dimension and LV end-diastolic dimension.. We also discussed symptoms that will warrant action. 2. Systolic blood pressures well controlled today. After switching to Diovan he will need to have his blood pressure rechecked. This will likely be done by an APP on our team.    Medication Adjustments/Labs and Tests Ordered: Current medicines are reviewed at length with the patient today.  Concerns regarding medicines are outlined above.  Medication changes, Labs and Tests ordered today are listed in the Patient Instructions below. There are no Patient Instructions on file for this visit.   Signed, Lesleigh NoeHenry W Candelaria Pies III, MD  09/08/2016 4:40 PM    [facility] [address], [place], [place]  [address] Phone: [phone]; Fax: [phone]

## 2016-09-08 NOTE — Patient Instructions (Signed)
Medication Instructions:  1) START taking a half tab of your Metoprolol (12.5mg ) until you are out of pills and then discontinue 2) The day after your last dose of Metoprolol, start Diovan 80mg  once daily  Labwork: BMET on day of return office visit  Testing/Procedures: None  Follow-Up: Your physician recommends that you schedule a follow-up appointment in: 4-6 weeks with a PA or NP  Your physician wants you to follow-up in: 6 months with Dr. Katrinka BlazingSmith. You will receive a reminder letter in the mail two months in advance. If you don't receive a letter, please call our office to schedule the follow-up appointment.    Any Other Special Instructions Will Be Listed Below (If Applicable).     If you need a refill on your cardiac medications before your next appointment, please call your pharmacy.

## 2016-10-18 ENCOUNTER — Ambulatory Visit (INDEPENDENT_AMBULATORY_CARE_PROVIDER_SITE_OTHER): Payer: Managed Care, Other (non HMO) | Admitting: Cardiology

## 2016-10-18 ENCOUNTER — Ambulatory Visit: Payer: Managed Care, Other (non HMO) | Admitting: Physician Assistant

## 2016-10-18 ENCOUNTER — Other Ambulatory Visit: Payer: Managed Care, Other (non HMO) | Admitting: *Deleted

## 2016-10-18 ENCOUNTER — Encounter (INDEPENDENT_AMBULATORY_CARE_PROVIDER_SITE_OTHER): Payer: Self-pay

## 2016-10-18 ENCOUNTER — Ambulatory Visit: Payer: Managed Care, Other (non HMO) | Admitting: Nurse Practitioner

## 2016-10-18 ENCOUNTER — Encounter: Payer: Self-pay | Admitting: Cardiology

## 2016-10-18 VITALS — BP 150/60 | HR 82 | Ht 70.0 in | Wt 220.4 lb

## 2016-10-18 DIAGNOSIS — I10 Essential (primary) hypertension: Secondary | ICD-10-CM

## 2016-10-18 DIAGNOSIS — I351 Nonrheumatic aortic (valve) insufficiency: Secondary | ICD-10-CM | POA: Diagnosis not present

## 2016-10-18 MED ORDER — VALSARTAN 160 MG PO TABS: 160.0000 mg | ORAL_TABLET | Freq: Every day | ORAL | 6 refills | Status: DC

## 2016-10-18 NOTE — Patient Instructions (Signed)
Increase Diovan to 160 mg daily  Lab Work today ( bmet )    Blood Pressure in 2 weeks   Your physician wants you to follow-up in: 5 months with Dr.Smith. You will receive a reminder letter in the mail two months in advance. If you don't receive a letter, please call our office to schedule the follow-up appointment.

## 2016-10-18 NOTE — Progress Notes (Signed)
Cardiology Office Note   Date:  10/18/2016   ID:  Eric [name], DOB 16-Jul-1956, MRN [id number]  PCP:  Lolita PatellaEADE,ROBERT ALEXANDER, MD  Cardiologist:  Dr. Katrinka BlazingSmith     Chief Complaint  Patient presents with  . Hypertension      History of Present Illness: Eric [name] is a 60 y.o. male who presents for his hx of severe aortic regurgitation and aortic root enlargement and change from BB to diovan on last visit.  He is here for BP check.    Pt has no complaints, no SOB, no chest pain.  His BP is running higher on diovan 80 mg.  He has a good understanding of change of meds.     Past Medical History:  Diagnosis Date  . Allergy    ALLERGIC RHINITIS..WORSE SPRING/FALL  . Aortic valve regurgitation 07/10/14   ECHO @ [facility]  . GERD (gastroesophageal reflux disease)    CURRENTLY DIET CONTROLLED  . Sinusitis, chronic     History reviewed. No pertinent surgical history.   Current Outpatient Prescriptions  Medication Sig Dispense Refill  . valsartan (DIOVAN) 160 MG tablet Take 1 tablet (160 mg total) by mouth daily. 30 tablet 6   No current facility-administered medications for this visit.     Allergies:   Amlodipine    Social History:  The patient  reports that he has never smoked. He has never used smokeless tobacco. He reports that he drinks alcohol.   Family History:  The patient's family history includes Cancer in his brother, father, and maternal grandmother; Diabetes in his brother and daughter; Heart disease in his maternal grandmother; Hypertension in his mother.    ROS:  General:no colds or fevers, no weight changes Skin:no rashes or ulcers HEENT:no blurred vision, no congestion CV:see HPI PUL:see HPI GI:no diarrhea constipation or melena, no indigestion GU:no hematuria, no dysuria MS:no joint pain, no claudication Neuro:no syncope, no lightheadedness Endo:no diabetes, no thyroid disease  Wt Readings from Last 3 Encounters:  10/18/16 220 lb 6.4 oz  (100 kg)  09/08/16 221 lb 3.2 oz (100.3 kg)  02/26/16 214 lb 6.4 oz (97.3 kg)     PHYSICAL EXAM: VS:  BP (!) 150/60   Pulse 82   Ht 5\' 10"  (1.778 m)   Wt 220 lb 6.4 oz (100 kg)   SpO2 94%   BMI 31.62 kg/m  , BMI Body mass index is 31.62 kg/m. General:Pleasant affect, NAD Skin:Warm and dry, brisk capillary refill HEENT:normocephalic, sclera clear, mucus membranes moist Neck:supple, no JVD, no bruits  Heart:S1S2 RRR with 3-4 /76 holosystolic murmur, no gallup, rub or click Lungs:clear without rales, rhonchi, or wheezes WUJ:WJXBAbd:soft, non tender, + BS, do not palpate liver spleen or masses Ext:no lower ext edema, 2+ pedal pulses, 2+ radial pulses Neuro:alert and oriented, MAE, follows commands, + facial symmetry    EKG:  EKG is NOT  ordered today.   Recent Labs: No results found for requested labs within last 8760 hours.    Lipid Panel No results found for: CHOL, TRIG, HDL, CHOLHDL, VLDL, LDLCALC, LDLDIRECT     Other studies Reviewed: Additional studies/ records that were reviewed today include: Review of echo data from the past 2 years:  2015: LVEF 60-65%; LVID-ED - 55 mm; LVID-ES - 33 mm 04/17: LVEF 60-65%; LVID-ED - 59 mm; LVID-ES - 41 mm 10/17: LVEF 55-60%; LVID-ED - 59.1 mm; LVID-ES - 38 mm   ASSESSMENT AND PLAN:  1.  Nonrheumatic aortic valve insufficiency  Recent  change of stopping BB over 2 weeks and beginning Diovan to slow LV enlargement. Will check BMP today and BP is elevated today. Will increase diovan to 160 mg.    -he will follow with Dr. Katrinka BlazingSmith in 5 months.   2. HTN essential. BP with diovan more elevated, will increase diovan. BP recheck in 2 weeks.   Current medicines are reviewed with the patient today.  The patient Has no concerns regarding medicines.  The following changes have been made:  See above Labs/ tests ordered today include:see above  Disposition:   FU:  see above  Signed, Nada BoozerLaura Ingold, NP  10/18/2016 4:33 PM    [facility] [address], [place], [place]  [address]/ [address] [place], [place] Phone: [phone]; Fax: [phone]) [phone]  [phone]

## 2016-10-19 LAB — BASIC METABOLIC PANEL
BUN: 23 mg/dL (ref 7–25)
CO2: 25 mmol/L (ref 20–31)
CREATININE: 1.51 mg/dL — AB (ref 0.70–1.33)
Calcium: 9.3 mg/dL (ref 8.6–10.3)
Chloride: 105 mmol/L (ref 98–110)
Glucose, Bld: 90 mg/dL (ref 65–99)
POTASSIUM: 4.3 mmol/L (ref 3.5–5.3)
Sodium: 140 mmol/L (ref 135–146)

## 2016-10-31 ENCOUNTER — Telehealth: Payer: Self-pay | Admitting: Cardiology

## 2016-10-31 NOTE — Telephone Encounter (Signed)
Pt notified. He will increase dose today and bring readings to appt on 12/15

## 2016-10-31 NOTE — Telephone Encounter (Signed)
Pt is calling because Eric [name] upped his medication now he is experiencing High BP 153/75 and would like to speak to a nurse about  This

## 2016-10-31 NOTE — Telephone Encounter (Signed)
He could increase to 2 tabs daily of valsartan for total of 320 mg.  He can start that now and then HTN clinic can make other adjustments.  Thanks.  Vernona RiegerLaura

## 2016-10-31 NOTE — Telephone Encounter (Signed)
Spoke with pt. He reports BP has been averaging 150's/70's.  Last checked today and it was 153/84. Doesn't always check heart rate but was 88 earlier today.  He normally takes BP in the AM, afternoon and in the evening. Lowest reading was 143/70. He is scheduled for appt with pharmacist in hypertension clinic on 12/15. I instructed pt to bring BP readings to this appt.  I told him I would send note to Nada BoozerLaura Ingold, NP for review and we would call him if she wanted to make medication changes prior to Friday's appt.

## 2016-11-04 ENCOUNTER — Ambulatory Visit: Payer: Managed Care, Other (non HMO)

## 2016-11-07 ENCOUNTER — Encounter: Payer: Self-pay | Admitting: Pharmacist

## 2016-11-07 ENCOUNTER — Other Ambulatory Visit: Payer: Self-pay | Admitting: Interventional Cardiology

## 2016-11-07 ENCOUNTER — Ambulatory Visit (INDEPENDENT_AMBULATORY_CARE_PROVIDER_SITE_OTHER): Payer: Managed Care, Other (non HMO) | Admitting: Pharmacist

## 2016-11-07 VITALS — BP 148/68 | HR 72 | Wt 218.8 lb

## 2016-11-07 DIAGNOSIS — I351 Nonrheumatic aortic (valve) insufficiency: Secondary | ICD-10-CM

## 2016-11-07 MED ORDER — HYDROCHLOROTHIAZIDE 12.5 MG PO CAPS: 12.5000 mg | ORAL_CAPSULE | Freq: Every day | ORAL | 1 refills | Status: DC

## 2016-11-07 NOTE — Patient Instructions (Addendum)
Return for a follow up appointment in 3-4 weeks  Call [phone] to schedule appt.  Check your blood pressure at home daily (if able) and keep record of the readings.  Take your BP meds as follows: Continue Valsartan 320mg  daily  START hydrochlorothiazide 12.5mg  daily   Bring all of your meds, your BP cuff and your record of home blood pressures to your next appointment.  Exercise as you're able, try to walk approximately 30 minutes per day.  Keep salt intake to a minimum, especially watch canned and prepared boxed foods.  Eat more fresh fruits and vegetables and fewer canned items.  Avoid eating in fast food restaurants.    HOW TO TAKE YOUR BLOOD PRESSURE: . Rest 5 minutes before taking your blood pressure. .  Don't smoke or drink caffeinated beverages for at least 30 minutes before. . Take your blood pressure before (not after) you eat. . Sit comfortably with your back supported and both feet on the floor (don't cross your legs). . Elevate your arm to heart level on a table or a desk. . Use the proper sized cuff. It should fit smoothly and snugly around your bare upper arm. There should be enough room to slip a fingertip under the cuff. The bottom edge of the cuff should be 1 inch above the crease of the elbow. . Ideally, take 3 measurements at one sitting and record the average.

## 2016-11-07 NOTE — Progress Notes (Signed)
Patient ID: Eric PostJerome J Creedon                 DOB: 1956/08/19                      MRN: [id number]     HPI: Eric [name] is a 60 y.o. male patient of Dr. Katrinka BlazingSmith with PMH below who presents today for hypertension evaluation. He previously saw Dr. Katrinka BlazingSmith and his beta blocker was discontinued and he was Started on Diovan 80 mg per day. Per Dr Katrinka BlazingSmith " This will be better at slowing LV enlargement. Beta blocker therapy simply allows a longer diastolic time which will increase aortic regurgitation volume."  He was recently seen by Nada BoozerLaura Ingold, PA and valsartan was titrated to 160mg  daily. Since then his valsartan was increased to 320mg  daily.   He reports no change with pressures with increased dose of valsartan.    Cardiac Hx: Aortic valve regurgitation  Current HTN meds:  Valsartan 320mg  daily  Previously tried:  Metoprolol - see above Amlodipine - cough per patient   BP goal: <130/80  Family History: Mother has hypertension.   Social History: Denies tobacco products. Endorses drinking occasionally (2 drinks a few times a week).   Diet: He denies adding salt to his food. He drinks 1 cup of coffee per morning and seldomly drinks soda or tea.   Exercise: He has not been exercising regularly. He occasionally does the exercise bike or elliptical.   Home BP readings:  Most of his readings are 140-150s/70s. Occasionally elevated to 160s systolic.  Omron cuff measures appropriately in office today.   Wt Readings from Last 3 Encounters:  11/07/16 218 lb 12 oz (99.2 kg)  10/18/16 220 lb 6.4 oz (100 kg)  09/08/16 221 lb 3.2 oz (100.3 kg)   BP Readings from Last 3 Encounters:  11/07/16 (!) 148/68  10/18/16 (!) 150/60  09/08/16 132/60   Pulse Readings from Last 3 Encounters:  11/07/16 72  10/18/16 82  09/08/16 66    Renal function: Estimated Creatinine Clearance: 62.2 mL/min (by C-G formula based on SCr of 1.51 mg/dL (H)).  Past Medical History:  Diagnosis Date  . Allergy    ALLERGIC RHINITIS..WORSE SPRING/FALL  . Aortic valve regurgitation 07/10/14   ECHO @ [facility]  . GERD (gastroesophageal reflux disease)    CURRENTLY DIET CONTROLLED  . Sinusitis, chronic     Current Outpatient Prescriptions on File Prior to Visit  Medication Sig Dispense Refill  . valsartan (DIOVAN) 160 MG tablet Take 1 tablet (160 mg total) by mouth daily. 30 tablet 6   No current facility-administered medications on file prior to visit.     Allergies  Allergen Reactions  . Amlodipine Cough    Blood pressure (!) 148/68, pulse 72, weight 218 lb 12 oz (99.2 kg).   Assessment/Plan: Hypertension: BP is not at goal. Discussed option of change in ARB (potentially to olmesartan) vs additional agent. Pt prefers to try addition of low dose hydrochlorothiazide 12.5mg  daily (as eventually we could do this as a combination with valsartan when he completes he supply). BMET today to ensure appropriate for addition of HCTZ after increase valsartan dose. Follow up in hypertension clinic in 3-4 weeks.    Thank you, Freddie ApleyKelley M. Cleatis PolkaAuten, PharmD  [facility]  11/07/2016 4:42 PM

## 2016-11-08 ENCOUNTER — Other Ambulatory Visit: Payer: Self-pay

## 2016-11-08 ENCOUNTER — Telehealth: Payer: Self-pay

## 2016-11-08 DIAGNOSIS — Z79899 Other long term (current) drug therapy: Secondary | ICD-10-CM

## 2016-11-08 LAB — BASIC METABOLIC PANEL
BUN: 21 mg/dL (ref 7–25)
CO2: 26 mmol/L (ref 20–31)
Calcium: 9.2 mg/dL (ref 8.6–10.3)
Chloride: 105 mmol/L (ref 98–110)
Creat: 1.57 mg/dL — ABNORMAL HIGH (ref 0.70–1.33)
GLUCOSE: 84 mg/dL (ref 65–99)
POTASSIUM: 4.5 mmol/L (ref 3.5–5.3)
SODIUM: 139 mmol/L (ref 135–146)

## 2016-11-08 NOTE — Telephone Encounter (Signed)
Pt is aware and agreeable to stable results. Informed patient that when he comes in on 12/09/16 for BP check that Dr. Katrinka BlazingSmith would like the BMET repeated. Pt states it seems like over kill to keep drawing labs. He feels like its the 3rd time in a month that he's had this lab drawn. I apologized to him that he felt that way but also let him know that because of the medications he is on we have to monitor his kidney functions to make sure the medication isn't causing him any damage. He was agreeable. I put in and linked orderes for repeat BMET at BP check OV

## 2016-12-08 NOTE — Progress Notes (Signed)
Patient ID: Eric [name]                 DOB: 03-27-1956                      MRN: [id number]     HPI: Eric [name] is a 61 y.o. male patient of Dr. Katrinka BlazingSmith with PMH below who presents today for hypertension follow up.  He previously saw Dr. Katrinka BlazingSmith and his beta blocker was discontinued and he was Started on Diovan 80 mg per day. Per Dr Katrinka BlazingSmith " This will be better at slowing LV enlargement. Beta blocker therapy simply allows a longer diastolic time which will increase aortic regurgitation volume."  He was recently seen by Nada BoozerLaura Ingold, PA and valsartan was titrated to 160mg  daily. Since then his valsartan was increased to 320mg  daily and he was started on HCTZ 12.5mg  daily.  He reports doing well con current regimen. Denies dizziness and other side effects related to the medication.    Cardiac Hx: Aortic vlave regurgitation, GERD  Current HTN meds:  Valsartan 320mg  daily HCTZ 12.5mg  daily  Previously tried:  Metoprolol - see above Amlodipine - cough per patient   BP goal: <130/80  Family History: Mother has hypertension.   Social History: Denies tobacco products. Endorses drinking occasionally (2 drinks a few times a week).   Diet: He denies adding salt to his food. He drinks 1 cup of coffee per morning and seldomly drinks soda or tea.   Exercise: He has not been exercising regularly. He occasionally does the exercise bike or elliptical.   Home BP readings:  Omron cuff previously measured appropriately in office   Wt Readings from Last 3 Encounters:  12/09/16 219 lb 8 oz (99.6 kg)  11/07/16 218 lb 12 oz (99.2 kg)  10/18/16 220 lb 6.4 oz (100 kg)   BP Readings from Last 3 Encounters:  12/09/16 (!) 122/56  11/07/16 (!) 148/68  10/18/16 (!) 150/60   Pulse Readings from Last 3 Encounters:  12/09/16 86  11/07/16 72  10/18/16 82    Renal function: Estimated Creatinine Clearance: 54.1 mL/min (by C-G formula based on SCr of 1.74 mg/dL (H)).  Past Medical History:    Diagnosis Date  . Allergy    ALLERGIC RHINITIS..WORSE SPRING/FALL  . Aortic valve regurgitation 07/10/14   ECHO @ [facility]  . GERD (gastroesophageal reflux disease)    CURRENTLY DIET CONTROLLED  . Sinusitis, chronic     Current Outpatient Prescriptions on File Prior to Visit  Medication Sig Dispense Refill  . Cetirizine HCl (ZYRTEC ALLERGY PO) Take 1 tablet by mouth daily as needed.     No current facility-administered medications on file prior to visit.     Allergies  Allergen Reactions  . Amlodipine Cough    Blood pressure (!) 122/56, pulse 86, weight 219 lb 8 oz (99.6 kg).   Assessment/Plan: Hypertension: BP at goal today, borderline low. No symptoms of low pressures per patient report. BMET today after adding HCTZ last visit. Pending results continue same medications. Order sent for combination product (valsartan 320/HCTZ 12.5 mg daily). Follow up with Dr. Katrinka BlazingSmith as indicated and hypertension clinic as needed.    Thank you, Eric [name], PharmD  [facility]

## 2016-12-09 ENCOUNTER — Ambulatory Visit (INDEPENDENT_AMBULATORY_CARE_PROVIDER_SITE_OTHER): Payer: Managed Care, Other (non HMO) | Admitting: Pharmacist

## 2016-12-09 ENCOUNTER — Other Ambulatory Visit: Payer: Self-pay | Admitting: Interventional Cardiology

## 2016-12-09 VITALS — BP 122/56 | HR 86 | Wt 219.5 lb

## 2016-12-09 DIAGNOSIS — Z79899 Other long term (current) drug therapy: Secondary | ICD-10-CM | POA: Diagnosis not present

## 2016-12-09 DIAGNOSIS — I10 Essential (primary) hypertension: Secondary | ICD-10-CM | POA: Diagnosis not present

## 2016-12-09 MED ORDER — VALSARTAN-HYDROCHLOROTHIAZIDE 320-12.5 MG PO TABS: 1.0000 | ORAL_TABLET | Freq: Every day | ORAL | 1 refills | Status: DC

## 2016-12-09 MED ORDER — HYDROCHLOROTHIAZIDE 12.5 MG PO TABS: 12.5000 mg | ORAL_TABLET | Freq: Every day | ORAL | 0 refills | Status: DC

## 2016-12-09 NOTE — Patient Instructions (Signed)
Check your blood pressure at home daily (if able) and keep record of the readings.  Take your BP meds as follows: Once complete current supply of valsartan and HCTZ start taking the combination pill once daily.   Bring all of your meds, your BP cuff and your record of home blood pressures to your next appointment.  Exercise as you're able, try to walk approximately 30 minutes per day.  Keep salt intake to a minimum, especially watch canned and prepared boxed foods.  Eat more fresh fruits and vegetables and fewer canned items.  Avoid eating in fast food restaurants.    HOW TO TAKE YOUR BLOOD PRESSURE: . Rest 5 minutes before taking your blood pressure. .  Don't smoke or drink caffeinated beverages for at least 30 minutes before. . Take your blood pressure before (not after) you eat. . Sit comfortably with your back supported and both feet on the floor (don't cross your legs). . Elevate your arm to heart level on a table or a desk. . Use the proper sized cuff. It should fit smoothly and snugly around your bare upper arm. There should be enough room to slip a fingertip under the cuff. The bottom edge of the cuff should be 1 inch above the crease of the elbow. . Ideally, take 3 measurements at one sitting and record the average.

## 2016-12-10 LAB — BASIC METABOLIC PANEL
BUN/Creatinine Ratio: 14 (ref 9–20)
BUN: 24 mg/dL (ref 6–24)
CALCIUM: 9.2 mg/dL (ref 8.7–10.2)
CO2: 25 mmol/L (ref 18–29)
CREATININE: 1.74 mg/dL — AB (ref 0.76–1.27)
Chloride: 100 mmol/L (ref 96–106)
GFR, EST AFRICAN AMERICAN: 49 mL/min/{1.73_m2} — AB (ref >59)
GFR, EST NON AFRICAN AMERICAN: 42 mL/min/{1.73_m2} — AB (ref >59)
Glucose: 116 mg/dL — ABNORMAL HIGH (ref 65–99)
Potassium: 4.1 mmol/L (ref 3.5–5.2)
Sodium: 142 mmol/L (ref 134–144)

## 2016-12-11 ENCOUNTER — Encounter: Payer: Self-pay | Admitting: Interventional Cardiology

## 2016-12-11 DIAGNOSIS — I130 Hypertensive heart and chronic kidney disease with heart failure and stage 1 through stage 4 chronic kidney disease, or unspecified chronic kidney disease: Secondary | ICD-10-CM | POA: Insufficient documentation

## 2016-12-11 DIAGNOSIS — N183 Chronic kidney disease, stage 3 unspecified: Secondary | ICD-10-CM | POA: Insufficient documentation

## 2016-12-12 ENCOUNTER — Telehealth: Payer: Self-pay | Admitting: *Deleted

## 2016-12-12 DIAGNOSIS — N183 Chronic kidney disease, stage 3 (moderate): Principal | ICD-10-CM

## 2016-12-12 DIAGNOSIS — I130 Hypertensive heart and chronic kidney disease with heart failure and stage 1 through stage 4 chronic kidney disease, or unspecified chronic kidney disease: Secondary | ICD-10-CM

## 2016-12-12 NOTE — Telephone Encounter (Signed)
-----   Message from Lyn RecordsHenry W Smith, MD sent at 12/11/2016  2:21 PM EST ----- Let the patient know Kidney function has slightly worsened since adding diuretic therapy. This is a concern but not alarming. This is consistent with adding these medications in the setting of pre-existing kidney disease. I believe he needs to see nephrology is not previously done to make sure no issues other than hypertensive kidney disease. Please arrange Nephrology Consult. A copy will be sent to Lolita PatellaEADE,ROBERT ALEXANDER, MD

## 2017-02-06 ENCOUNTER — Other Ambulatory Visit: Payer: Self-pay | Admitting: Nephrology

## 2017-02-06 DIAGNOSIS — N183 Chronic kidney disease, stage 3 unspecified: Secondary | ICD-10-CM

## 2017-02-10 ENCOUNTER — Ambulatory Visit
Admission: RE | Admit: 2017-02-10 | Discharge: 2017-02-10 | Disposition: A | Discharge status: Home / Self Care | Payer: Managed Care, Other (non HMO) | Source: Ambulatory Visit | Attending: Nephrology | Admitting: Nephrology

## 2017-02-10 DIAGNOSIS — N183 Chronic kidney disease, stage 3 unspecified: Secondary | ICD-10-CM

## 2017-03-03 ENCOUNTER — Encounter: Payer: Self-pay | Admitting: Interventional Cardiology

## 2017-03-21 NOTE — Progress Notes (Signed)
Cardiology Office Note    Date:  03/22/2017   ID:  Eric [name], Eric [name] 01/24/1956, MRN [id number]  PCP:  Lolita Patella, MD  Cardiologist: Lesleigh Noe, MD   Chief Complaint  Patient presents with  . Cardiac Valve Problem    History of Present Illness:  Eric [name] is a 61 y.o. male who presents for Severe aortic regurgitation.   [name] has no cardiac complaints. He was seen by nephrology as instructed. Observation was prescribed. No medication side effects. He denies orthopnea, PND, syncope, and edema.    Past Medical History:  Diagnosis Date  . Allergy    ALLERGIC RHINITIS..WORSE SPRING/FALL  . Aortic valve regurgitation 07/10/14   ECHO @ [facility]  . GERD (gastroesophageal reflux disease)    CURRENTLY DIET CONTROLLED  . Sinusitis, chronic     No past surgical history on file.  Current Medications: Outpatient Medications Prior to Visit  Medication Sig Dispense Refill  . Cetirizine HCl (ZYRTEC ALLERGY PO) Take 1 tablet by mouth daily as needed.    . valsartan-hydrochlorothiazide (DIOVAN-HCT) 320-12.5 MG tablet Take 1 tablet by mouth daily. 90 tablet 1   No facility-administered medications prior to visit.      Allergies:   Amlodipine   Social History   Social History  . Marital status: Married    Spouse name: N/A  . Number of children: N/A  . Years of education: N/A   Social History Main Topics  . Smoking status: Never Smoker  . Smokeless tobacco: Never Used  . Alcohol use 0.0 oz/week     Comment: occasional  . Drug use: No  . Sexual activity: Not Asked   Other Topics Concern  . None   Social History Narrative  . None     Family History:  The patient's family history includes Cancer in his brother, father, and maternal grandmother; Diabetes in his brother and daughter; Heart disease in his maternal grandmother; Hypertension in his mother.   ROS:   Please see the history of present illness.    Low back discomfort, but  otherwise unremarkable.  All other systems reviewed and are negative.   PHYSICAL EXAM:   VS:  BP 132/72   Pulse 88   Ht  (1.778 m)   Wt 208 lb (94.3 kg)   SpO2 97%   BMI 29.84 kg/m    GEN: Well nourished, well developed, in no acute distress  HEENT: normal  Neck: no JVD, carotid bruits, or masses Cardiac: RRR; 3/6 crescendo decrescendo systolic and 4/6 holodiastolic decrescendo murmur associated with aortic regurgitation.No rubs, or gallops,no edema . Respiratory:  clear to auscultation bilaterally, normal work of breathing GI: soft, nontender, nondistended, + BS MS: no deformity or atrophy  Skin: warm and dry, no rash Neuro:  Alert and Oriented x 3, Strength and sensation are intact Psych: euthymic mood, full affect  Wt Readings from Last 3 Encounters:  03/22/17 208 lb (94.3 kg)  12/09/16 219 lb 8 oz (99.6 kg)  11/07/16 218 lb 12 oz (99.2 kg)      Studies/Labs Reviewed:   EKG:  EKG  Not repeated.  Recent Labs: 12/09/2016: BUN 24; Creatinine, Ser 1.74; Potassium 4.1; Sodium 142   Lipid Panel No results found for: CHOL, TRIG, HDL, CHOLHDL, VLDL, LDLCALC, LDLDIRECT  Additional studies/ records that were reviewed today include:  Echocardiogram October 2017: Study Conclusions  - Left ventricle: The cavity size was mildly dilated. Wall   thickness was increased in a  pattern of mild LVH. Systolic   function was normal. The estimated ejection fraction was in the   range of 55% to 60%. Wall motion was normal; there were no   regional wall motion abnormalities. Left ventricular diastolic   function parameters were normal. - Aortic valve: There was moderate to severe regurgitation. - Aortic root: The aortic root was mildly dilated. - Mitral valve: There was mild regurgitation. - Left atrium: The atrium was mildly dilated. - Pulmonary arteries: Systolic pressure was mildly increased.  Impressions:  - Normal LV systolic function; mild LVH; mild LVE; mildly  thickened   aortic valve with moderate to severe AI (eccentric); mild MR;   mildly dilated ascending aorta.    ASSESSMENT:    1. Nonrheumatic aortic valve insufficiency   2. 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 Clinically stable with reference to symptoms. Echo will be performed in 6 months. Clinical follow-up in 6 months. Cautioned to call if exertional fatigue, dyspnea, swelling, or other CV complaints. 2. Blood pressures now controlled. Chronic kidney disease stage III is stable.  Clinical follow-up in 6 months. Echocardiogram preceding the office visit. Cautioned to call if dyspnea Continue same medications.    Medication Adjustments/Labs and Tests Ordered: Current medicines are reviewed at length with the patient today.  Concerns regarding medicines are outlined above.  Medication changes, Labs and Tests ordered today are listed in the Patient Instructions below. There are no Patient Instructions on file for this visit.   Signed, Lesleigh Noe, MD  03/22/2017 4:22 PM    [facility] [address] [place], [place], [place]  [address] Phone: [phone]; Fax: [phone]

## 2017-03-22 ENCOUNTER — Encounter: Payer: Self-pay | Admitting: Interventional Cardiology

## 2017-03-22 ENCOUNTER — Ambulatory Visit (INDEPENDENT_AMBULATORY_CARE_PROVIDER_SITE_OTHER): Payer: Managed Care, Other (non HMO) | Admitting: Interventional Cardiology

## 2017-03-22 VITALS — BP 132/72 | HR 88 | Ht 70.0 in | Wt 208.0 lb

## 2017-03-22 DIAGNOSIS — N183 Chronic kidney disease, stage 3 (moderate): Secondary | ICD-10-CM | POA: Diagnosis not present

## 2017-03-22 DIAGNOSIS — I130 Hypertensive heart and chronic kidney disease with heart failure and stage 1 through stage 4 chronic kidney disease, or unspecified chronic kidney disease: Secondary | ICD-10-CM

## 2017-03-22 DIAGNOSIS — I351 Nonrheumatic aortic (valve) insufficiency: Secondary | ICD-10-CM | POA: Diagnosis not present

## 2017-03-22 MED ORDER — VALSARTAN-HYDROCHLOROTHIAZIDE 320-12.5 MG PO TABS: 1.0000 | ORAL_TABLET | Freq: Every day | ORAL | 3 refills | Status: DC

## 2017-03-22 NOTE — Patient Instructions (Signed)
Medication Instructions:  None  Labwork: None  Testing/Procedures: Your physician has requested that you have an echocardiogram October 2018. Echocardiography is a painless test that uses sound waves to create images of your heart. It provides your doctor with information about the size and shape of your heart and how well your heart's chambers and valves are working. This procedure takes approximately one hour. There are no restrictions for this procedure.    Follow-Up: Your physician wants you to follow-up in: October 2018 with Dr. Katrinka Blazing.  You will receive a reminder letter in the mail two months in advance. If you don't receive a letter, please call our office to schedule the follow-up appointment.   Any Other Special Instructions Will Be Listed Below (If Applicable).     If you need a refill on your cardiac medications before your next appointment, please call your pharmacy.

## 2017-06-26 ENCOUNTER — Other Ambulatory Visit: Payer: Self-pay

## 2017-06-27 ENCOUNTER — Telehealth: Payer: Self-pay | Admitting: Interventional Cardiology

## 2017-06-27 NOTE — Telephone Encounter (Signed)
Pt states he spoke with someone at our office yesterday about the Valsartan recall and they told him that they would send a message about getting pt switched.  Pt mentioned that his wife picked up his Valsartan yesterday from the pharmacy and when he called them about the recall they mentioned that the batch his medication came from was not included in the recall.  Pt wanted to know if he should still plan to switch.  Advised if his batch was not included in the recall then it would be fine to take.  Advised to call pharmacy back and double check.  Pt verbalized understanding and was in agreement with this plan.

## 2017-06-27 NOTE — Telephone Encounter (Signed)
New Message     Pt c/o medication issue:  1. Name of Medication:  valsartan  2. How are you currently taking this medication (dosage and times per day)?  1x a day  3. Are you having a reaction (difficulty breathing--STAT)?  no 4. What is your medication issue?  Does not want to do conversion that you discussed yesterday

## 2017-07-12 MED ORDER — VALSARTAN-HYDROCHLOROTHIAZIDE 320-12.5 MG PO TABS: 1.0000 | ORAL_TABLET | Freq: Every day | ORAL | 3 refills | Status: DC

## 2017-08-25 ENCOUNTER — Other Ambulatory Visit: Payer: Self-pay

## 2017-08-25 ENCOUNTER — Ambulatory Visit (HOSPITAL_COMMUNITY): Payer: Managed Care, Other (non HMO) | Attending: Cardiovascular Disease

## 2017-08-25 DIAGNOSIS — K219 Gastro-esophageal reflux disease without esophagitis: Secondary | ICD-10-CM | POA: Insufficient documentation

## 2017-08-25 DIAGNOSIS — I351 Nonrheumatic aortic (valve) insufficiency: Secondary | ICD-10-CM | POA: Insufficient documentation

## 2017-08-25 DIAGNOSIS — N189 Chronic kidney disease, unspecified: Secondary | ICD-10-CM | POA: Insufficient documentation

## 2017-08-31 ENCOUNTER — Encounter: Payer: Self-pay | Admitting: Interventional Cardiology

## 2017-08-31 ENCOUNTER — Ambulatory Visit (INDEPENDENT_AMBULATORY_CARE_PROVIDER_SITE_OTHER): Payer: Managed Care, Other (non HMO) | Admitting: Interventional Cardiology

## 2017-08-31 VITALS — BP 138/60 | HR 80 | Ht 70.0 in | Wt 214.4 lb

## 2017-08-31 DIAGNOSIS — I10 Essential (primary) hypertension: Secondary | ICD-10-CM

## 2017-08-31 DIAGNOSIS — I130 Hypertensive heart and chronic kidney disease with heart failure and stage 1 through stage 4 chronic kidney disease, or unspecified chronic kidney disease: Secondary | ICD-10-CM | POA: Diagnosis not present

## 2017-08-31 DIAGNOSIS — N183 Chronic kidney disease, stage 3 unspecified: Secondary | ICD-10-CM

## 2017-08-31 DIAGNOSIS — I351 Nonrheumatic aortic (valve) insufficiency: Secondary | ICD-10-CM

## 2017-08-31 NOTE — Progress Notes (Signed)
Cardiology Office Note    Date:  08/31/2017   ID:  Conrad, Zajkowski 20-Dec-1955, MRN [id number]  PCP:  Elias Else, MD  Cardiologist: Lesleigh Noe, MD   Chief Complaint  Patient presents with  . Cardiac Valve Problem    History of Present Illness:  Eric [name] is a 61 y.o. male for follow-up of significant aortic regurgitation, Hypertension, left ventricular hypertrophy, and chronic kidney disease stage III.  No symptoms. Echocardiogram reveals stable LV size and function. He is physically active. No orthopnea, PND, or chest pain. There is no peripheral edema. No medication side effects.    Past Medical History:  Diagnosis Date  . Allergy    ALLERGIC RHINITIS..WORSE SPRING/FALL  . Aortic valve regurgitation 07/10/14   ECHO @ [facility]  . GERD (gastroesophageal reflux disease)    CURRENTLY DIET CONTROLLED  . Sinusitis, chronic     No past surgical history on file.  Current Medications: Outpatient Medications Prior to Visit  Medication Sig Dispense Refill  . Cetirizine HCl (ZYRTEC ALLERGY PO) Take 1 tablet by mouth daily as needed (allergies).     . valsartan-hydrochlorothiazide (DIOVAN-HCT) 320-12.5 MG tablet Take 1 tablet by mouth daily. 90 tablet 3   No facility-administered medications prior to visit.      Allergies:   Amlodipine   Social History   Social History  . Marital status: Married    Spouse name: N/A  . Number of children: N/A  . Years of education: N/A   Social History Main Topics  . Smoking status: Never Smoker  . Smokeless tobacco: Never Used  . Alcohol use 0.0 oz/week     Comment: occasional  . Drug use: No  . Sexual activity: Not Asked   Other Topics Concern  . None   Social History Narrative  . None     Family History:  The patient's family history includes Cancer in his brother, father, and maternal grandmother; Diabetes in his brother and daughter; Heart disease in his maternal grandmother; Hypertension in his  mother.   ROS:   Please see the history of present illness.    No complaints  All other systems reviewed and are negative.   PHYSICAL EXAM:   VS:  BP 138/60 (BP Location: Left Arm)   Pulse 80   Ht  (1.778 m)   Wt 214 lb 6.4 oz (97.3 kg)   BMI 30.76 kg/m    GEN: Well nourished, well developed, in no acute distress  HEENT: normal  Neck: no JVD, carotid bruits, or masses Cardiac: RRR; 3 to 4/6 decrescendo diastolic murmur or AR. No rubs, or gallops,no edema  Respiratory:  clear to auscultation bilaterally, normal work of breathing GI: soft, nontender, nondistended, + BS MS: no deformity or atrophy  Skin: warm and dry, no rash Neuro:  Alert and Oriented x 3, Strength and sensation are intact Psych: euthymic mood, full affect  Wt Readings from Last 3 Encounters:  08/31/17 214 lb 6.4 oz (97.3 kg)  03/22/17 208 lb (94.3 kg)  12/09/16 219 lb 8 oz (99.6 kg)      Studies/Labs Reviewed:   EKG:  EKG  Left atraumatic, prominent voltage consistent with left ventricular hypertrophy, no change when compared to prior tracings.  Recent Labs: 12/09/2016: BUN 24; Creatinine, Ser 1.74; Potassium 4.1; Sodium 142   Lipid Panel No results found for: CHOL, TRIG, HDL, CHOLHDL, VLDL, LDLCALC, LDLDIRECT  Additional studies/ records that were reviewed today include:  ECHO 08/25/2017  Conclusions   - Left ventricle: The cavity size was normal. Wall thickness was   increased in a pattern of mild LVH. Systolic function was normal.   The estimated ejection fraction was in the range of 60% to 65%.   Wall motion was normal; there were no regional wall motion   abnormalities. Doppler parameters are consistent with abnormal   left ventricular relaxation (grade 1 diastolic dysfunction). - Aortic valve: There was mild stenosis. There was moderate to   severe regurgitation. Regurgitation pressure half-time: 352 ms. - Mitral valve: Transvalvular velocity was within the normal range.   There was  no evidence for stenosis. There was trivial   regurgitation. - Right ventricle: The cavity size was normal. Wall thickness was   normal. Systolic function was normal. - Atrial septum: No defect or patent foramen ovale was identified   by color flow Doppler. - Tricuspid valve: There was trivial regurgitation. - Pulmonary arteries: Systolic pressure was within the normal   range. PA peak pressure: 33 mm Hg (S).   2016 -  04/13/15       63 (LVIDD)    42(LVIDS) 2017 -  02/23/2016     59.4 (LVIDD) 41.6(LVIDS) 2018 - 09/05/2016  59.1(LVIDD) 37.8 (LVIDS) 2018 - 08/25/2017    53.1(LVIDD) 32(LVIDS)    ASSESSMENT:    1. Nonrheumatic aortic valve insufficiency   2. 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 Essential hypertension      PLAN:  In order of problems listed above:  1. Significant aortic regurgitation but well tolerated by LV as noted above. With aggressive antihypertensive therapy, LV in systolic and LV end-diastolic diameters have steadily decreased over the last 2 years. EF is 65%. Plan clinical follow-up in one year with echocardiogram at that time. 2. Blood pressures under control. Target 140/90 mmHg a less. Kidney disease is being followed by Dr. Casimiro Needle. 3. As noted above.  Considerable time spent in going over echo parameters that would require action. Questions were answered. Echocardiogram in 1 year along with clinical follow-up.    Medication Adjustments/Labs and Tests Ordered: Current medicines are reviewed at length with the patient today.  Concerns regarding medicines are outlined above.  Medication changes, Labs and Tests ordered today are listed in the Patient Instructions below. Patient Instructions  Medication Instructions:  Your physician recommends that you continue on your current medications as directed. Please refer to the Current Medication list given to you today.   Labwork: None  Testing/Procedures: Your physician  has requested that you have an echocardiogram in 1 year. Echocardiography is a painless test that uses sound waves to create images of your heart. It provides your doctor with information about the size and shape of your heart and how well your heart's chambers and valves are working. This procedure takes approximately one hour. There are no restrictions for this procedure.    Follow-Up: Your physician wants you to follow-up in: 1 year with Dr. Katrinka Blazing.  You will receive a reminder letter in the mail two months in advance. If you don't receive a letter, please call our office to schedule the follow-up appointment.   Any Other Special Instructions Will Be Listed Below (If Applicable).     If you need a refill on your cardiac medications before your next appointment, please call your pharmacy.      Signed, Lesleigh Noe, MD  08/31/2017 2:58 PM    [facility]  [address] [place], [place]  [address] Phone: [phone]; Fax: [phone]

## 2017-08-31 NOTE — Patient Instructions (Signed)
Medication Instructions:  Your physician recommends that you continue on your current medications as directed. Please refer to the Current Medication list given to you today.  Labwork: None  Testing/Procedures: Your physician has requested that you have an echocardiogram in 1 year. Echocardiography is a painless test that uses sound waves to create images of your heart. It provides your doctor with information about the size and shape of your heart and how well your heart's chambers and valves are working. This procedure takes approximately one hour. There are no restrictions for this procedure.   Follow-Up: Your physician wants you to follow-up in: 1 year with Dr. Smith.  You will receive a reminder letter in the mail two months in advance. If you don't receive a letter, please call our office to schedule the follow-up appointment.   Any Other Special Instructions Will Be Listed Below (If Applicable).     If you need a refill on your cardiac medications before your next appointment, please call your pharmacy.   

## 2018-04-06 ENCOUNTER — Other Ambulatory Visit: Payer: Self-pay | Admitting: Interventional Cardiology

## 2018-07-24 ENCOUNTER — Other Ambulatory Visit: Payer: Self-pay | Admitting: Interventional Cardiology

## 2018-08-31 ENCOUNTER — Ambulatory Visit (HOSPITAL_COMMUNITY): Payer: Managed Care, Other (non HMO) | Attending: Cardiology

## 2018-08-31 ENCOUNTER — Other Ambulatory Visit: Payer: Self-pay

## 2018-08-31 DIAGNOSIS — I351 Nonrheumatic aortic (valve) insufficiency: Secondary | ICD-10-CM

## 2018-09-03 ENCOUNTER — Telehealth: Payer: Self-pay | Admitting: *Deleted

## 2018-09-03 NOTE — Telephone Encounter (Signed)
Pt returned my call and he has been made aware that his echo is stable c/w with previous studies, and actually had improved form 2015. Pt verbalized understanding.

## 2018-09-03 NOTE — Telephone Encounter (Signed)
Working in [employer].  Called pt re: echo results, left a message for pt to call back.

## 2018-09-03 NOTE — Telephone Encounter (Signed)
-----   Message from Lyn Records, MD sent at 09/02/2018  3:52 PM EDT ----- Let the patient know echo is stable c/w last year and improved compared to 2015. Will discuss. A copy will be sent to Elias Else, MD

## 2018-09-13 ENCOUNTER — Ambulatory Visit: Payer: Managed Care, Other (non HMO) | Admitting: Interventional Cardiology

## 2018-10-06 NOTE — Progress Notes (Signed)
Cardiology Office Note:    Date:  10/08/2018   ID:  Eric [name], DOB Mar 02, 1956, MRN [id number]  PCP:  Elias Else, MD  Cardiologist:  Lesleigh Noe, MD   Referring MD: Elias Else, MD   Chief Complaint  Patient presents with  . Cardiac Valve Problem    History of Present Illness:    Eric [name] is a 62 y.o. male with a hx of significant aortic regurgitation, hypertension, left ventricular hypertrophy, and chronic kidney disease stage III.  Eric [name] looks well and feels that he is doing well.  He is not having shortness of breath, limited exertional tolerance, lightheadedness, syncope, palpitations, edema, or orthopnea.  He is tolerating current medical therapy without difficulty.  He has had no muscle cramping or other potential medication side effects.  We spent significant time discussing the natural history of bicuspid aortic valve disease.  His aortic root is stable.  He has stable aortic regurgitation.  There is been a slight increase in the LV in systolic diameter index but he is still well below the cutoff of 22 mm/m.  We discussed the reason not to operate too soon, starting the natural history clock taking on his prosthetic valve especially as he is relatively young.   Past Medical History:  Diagnosis Date  . Allergy    ALLERGIC RHINITIS..WORSE SPRING/FALL  . Aortic valve regurgitation 07/10/14   ECHO @ [facility]  . GERD (gastroesophageal reflux disease)    CURRENTLY DIET CONTROLLED  . Sinusitis, chronic     History reviewed. No pertinent surgical history.  Current Medications: Current Meds  Medication Sig  . allopurinol (ZYLOPRIM) 300 MG tablet Take 300 mg by mouth daily.  . Cetirizine HCl (ZYRTEC ALLERGY PO) Take 1 tablet by mouth daily as needed (allergies).   . valsartan-hydrochlorothiazide (DIOVAN-HCT) 320-12.5 MG tablet Take 1 tablet by mouth daily.     Allergies:   Amlodipine   Social History   Socioeconomic History  . Marital  status: Married    Spouse name: Not on file  . Number of children: Not on file  . Years of education: Not on file  . Highest education level: Not on file  Occupational History  . Not on file  Social Needs  . Financial resource strain: Not on file  . Food insecurity:    Worry: Not on file    Inability: Not on file  . Transportation needs:    Medical: Not on file    Non-medical: Not on file  Tobacco Use  . Smoking status: Never Smoker  . Smokeless tobacco: Never Used  Substance and Sexual Activity  . Alcohol use: Yes    Alcohol/week: 0.0 standard drinks    Comment: occasional  . Drug use: No  . Sexual activity: Not on file  Lifestyle  . Physical activity:    Days per week: Not on file    Minutes per session: Not on file  . Stress: Not on file  Relationships  . Social connections:    Talks on phone: Not on file    Gets together: Not on file    Attends religious service: Not on file    Active member of club or organization: Not on file    Attends meetings of clubs or organizations: Not on file    Relationship status: Not on file  Other Topics Concern  . Not on file  Social History Narrative  . Not on file     Family History: The  patient's family history includes Cancer in his brother, father, and maternal grandmother; Diabetes in his brother and daughter; Heart disease in his maternal grandmother; Hypertension in his mother.  ROS:   Please see the history of present illness.    Not diligent with aerobic activity.  Traveling a lot related to his job without limitations.  Very active on a daily basis having to do a lot of work and walking without any limitations.  All other systems reviewed and are negative.  EKGs/Labs/Other Studies Reviewed:    The following studies were reviewed today: 2 D Doppler Echocardiogram  08/31/2018:: Study Conclusions  - Left ventricle: The cavity size was mildly dilated. There was   mild focal basal hypertrophy of the septum. Systolic  function was   normal. The estimated ejection fraction was in the range of 60%   to 65%. Wall motion was normal; there were no regional wall   motion abnormalities. Doppler parameters are consistent with   abnormal left ventricular relaxation (grade 1 diastolic   dysfunction). - Aortic valve: Functionally bicuspid; mildly calcified leaflets.   There was mild stenosis. There was severe regurgitation. - Ascending aorta: The ascending aorta was mildly dilated. - Mitral valve: Calcified annulus. There was mild regurgitation.  ESDI = 16.9 mm/m2. Mild dilatation of LV at end diastole, 56 mm..  Impressions:  - Normal LV systolic function; mild LVE; mild diastolic   dysfunction; functionally bicuspid aortic valve with mild AS   (mean gradient 16 mmHg) and severe AI; mildly dilated ascending   aorta; mild MR.    EKG:  EKG is  ordered today.  The ekg ordered today demonstrates normal sinus rhythm, prominent voltage, nonspecific ST flattening.  When compared to the prior tracing from 2018, no change has occurred.  Recent Labs: No results found for requested labs within last 8760 hours.  Recent Lipid Panel No results found for: CHOL, TRIG, HDL, CHOLHDL, VLDL, LDLCALC, LDLDIRECT  Physical Exam:    VS:  BP (!) 124/58   Pulse 83   Ht 5\' 10"  (1.778 m)   Wt 208 lb 6.4 oz (94.5 kg)   SpO2 96%   BMI 29.90 kg/m     Wt Readings from Last 3 Encounters:  10/08/18 208 lb 6.4 oz (94.5 kg)  08/31/17 214 lb 6.4 oz (97.3 kg)  03/22/17 208 lb (94.3 kg)     GEN:  Well nourished, well developed in no acute distress HEENT: Normal NECK: No JVD. LYMPHATICS: No lymphadenopathy CARDIAC: RRR, 4/6 holodiastolic aortic regurgitation murmur; 2/6 crescendo decrescendo systolic murmur.  No gallop, no edema. VASCULAR: Bilateral carotid and radial 3+ symmetric pulses.  No bruits. RESPIRATORY:  Clear to auscultation without rales, wheezing or rhonchi  ABDOMEN: Soft, non-tender, non-distended, No pulsatile  mass, MUSCULOSKELETAL: No deformity  SKIN: Warm and dry NEUROLOGIC:  Alert and oriented x 3 PSYCHIATRIC:  Normal affect   ASSESSMENT:    1. Nonrheumatic aortic valve insufficiency   2. 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 Essential hypertension   4. Hyperlipidemia with target LDL less than 70    PLAN:    In order of problems listed above:  1. Bicuspid aortic valve with severe aortic regurgitation and stable LV in systolic dimension.  The end-diastolic dimension is 56 mm.  Overall, very little change in volume/size and disease in a patient who is asymptomatic.  This warrants continued follow-up.  With severe aortic regurgitation I feel we should start seeing Eric [name] at  least twice per year.  When I see Eric [name] back in 6 months, we will determine when to do the next echocardiogram.  Likely to be repeated in a year from now but will depend upon clinical exam and patient's overall status. 2. Blood pressure is under excellent control now on combination ARB/diuretic therapy. 3. Excellent control getting target of 130/80 mmHg or less. 4. Lipids are significantly elevated based upon values from May.  We need to consider starting statin therapy to avoid superimposed ischemic complications on top of his underlying valvular disease.  I would recommend rosuvastatin 20 mg/day.  On return in 6 months if this is not been started by his primary physician, will initiate therapy.  Significant discussion concerning disease follow-up and natural history of aortic valve disease.  We did not discuss treatment options but he understands that this will likely be open surgical therapy.  Blood pressure and lipids need to be monitored and treated to 130/80 mmHg or less and LDL less than 100 and preferably less than 70 to avoid primary ischemic events.  Overall education and awareness concerning primary/secondary risk prevention was discussed in detail: LDL less than 70, hemoglobin A1c less  than 7, blood pressure target less than 130/80 mmHg, >150 minutes of moderate aerobic activity per week, avoidance of smoking, weight control (via diet and exercise), and continued surveillance/management of/for obstructive sleep apnea.  Greater than 50% of the time during this office visit was spent in education, counseling, and coordination of care related to underlying disease process and testing as outlined.     Medication Adjustments/Labs and Tests Ordered: Current medicines are reviewed at length with the patient today.  Concerns regarding medicines are outlined above.  Orders Placed This Encounter  Procedures  . EKG 12-Lead   No orders of the defined types were placed in this encounter.   Patient Instructions  Medication Instructions:  No changes If you need a refill on your cardiac medications before your next appointment, please call your pharmacy.   Lab work: none If you have labs (blood work) drawn today and your tests are completely normal, you will receive your results only by: Marland Kitchen MyChart Message (if you have MyChart) OR . A paper copy in the mail If you have any lab test that is abnormal or we need to change your treatment, we will call you to review the results.  Testing/Procedures: none  Follow-Up: At [facility], you and your health needs are our priority.  As part of our continuing mission to provide you with exceptional heart care, we have created designated Provider Care Teams.  These Care Teams include your primary Cardiologist (physician) and Advanced Practice Providers (APPs -  Physician Assistants and Nurse Practitioners) who all work together to provide you with the care you need, when you need it. You will need a follow up appointment in 6 months.  Please call our office 2 months in advance to schedule this appointment.  You may see Lesleigh Noe, MD or one of the following Advanced Practice Providers on your designated Care Team:   Norma Fredrickson,  NP Nada Boozer, NP . Georgie Chard, NP  Any Other Special Instructions Will Be Listed Below (If Applicable). Please call for sooner appointment if you begin to experience extreme fatigue or shortness of breath either at rest or with activites      Signed, Lesleigh Noe, MD  10/08/2018 9:28 AM    [facility]

## 2018-10-08 ENCOUNTER — Encounter: Payer: Self-pay | Admitting: Interventional Cardiology

## 2018-10-08 ENCOUNTER — Ambulatory Visit (INDEPENDENT_AMBULATORY_CARE_PROVIDER_SITE_OTHER): Payer: Managed Care, Other (non HMO) | Admitting: Interventional Cardiology

## 2018-10-08 VITALS — BP 124/58 | HR 83 | Ht 70.0 in | Wt 208.4 lb

## 2018-10-08 DIAGNOSIS — I351 Nonrheumatic aortic (valve) insufficiency: Secondary | ICD-10-CM | POA: Diagnosis not present

## 2018-10-08 DIAGNOSIS — I10 Essential (primary) hypertension: Secondary | ICD-10-CM

## 2018-10-08 DIAGNOSIS — N183 Chronic kidney disease, stage 3 (moderate): Secondary | ICD-10-CM

## 2018-10-08 DIAGNOSIS — E785 Hyperlipidemia, unspecified: Secondary | ICD-10-CM

## 2018-10-08 DIAGNOSIS — I130 Hypertensive heart and chronic kidney disease with heart failure and stage 1 through stage 4 chronic kidney disease, or unspecified chronic kidney disease: Secondary | ICD-10-CM

## 2018-10-08 NOTE — Patient Instructions (Signed)
Medication Instructions:  No changes If you need a refill on your cardiac medications before your next appointment, please call your pharmacy.   Lab work: none If you have labs (blood work) drawn today and your tests are completely normal, you will receive your results only by: Marland Kitchen. MyChart Message (if you have MyChart) OR . A paper copy in the mail If you have any lab test that is abnormal or we need to change your treatment, we will call you to review the results.  Testing/Procedures: none  Follow-Up: At [facility], you and your health needs are our priority.  As part of our continuing mission to provide you with exceptional heart care, we have created designated Provider Care Teams.  These Care Teams include your primary Cardiologist (physician) and Advanced Practice Providers (APPs -  Physician Assistants and Nurse Practitioners) who all work together to provide you with the care you need, when you need it. You will need a follow up appointment in 6 months.  Please call our office 2 months in advance to schedule this appointment.  You may see Lesleigh NoeHenry W Smith III, MD or one of the following Advanced Practice Providers on your designated Care Team:   Norma FredricksonLori Gerhardt, NP Nada BoozerLaura Ingold, NP . Georgie ChardJill McDaniel, NP  Any Other Special Instructions Will Be Listed Below (If Applicable). Please call for sooner appointment if you begin to experience extreme fatigue or shortness of breath either at rest or with activites

## 2018-11-05 ENCOUNTER — Other Ambulatory Visit: Payer: Self-pay | Admitting: Interventional Cardiology

## 2019-04-01 ENCOUNTER — Telehealth: Payer: Self-pay | Admitting: Interventional Cardiology

## 2019-04-01 NOTE — Telephone Encounter (Signed)
Spoke with pt in regards to appt with Dr. Katrinka Blazing on 5/27.  Pt agreeable to a video visit using [employer].  Advised my CMA would call him a few days prior to appt with details.  Pt verbalized understanding and was in agreement with this plan.

## 2019-04-17 ENCOUNTER — Telehealth: Payer: Managed Care, Other (non HMO) | Admitting: Interventional Cardiology

## 2019-06-22 DIAGNOSIS — J189 Pneumonia, unspecified organism: Secondary | ICD-10-CM

## 2019-06-22 HISTORY — DX: Pneumonia, unspecified organism: J18.9

## 2019-07-08 ENCOUNTER — Encounter (HOSPITAL_COMMUNITY): Payer: Self-pay

## 2019-07-08 ENCOUNTER — Emergency Department (HOSPITAL_COMMUNITY): Payer: Managed Care, Other (non HMO)

## 2019-07-08 ENCOUNTER — Inpatient Hospital Stay (HOSPITAL_COMMUNITY)
Admission: EM | Admit: 2019-07-08 | Discharge: 2019-07-15 | DRG: 177 | Disposition: A | Discharge status: Home / Self Care | Payer: Managed Care, Other (non HMO) | Attending: Internal Medicine | Admitting: Internal Medicine

## 2019-07-08 ENCOUNTER — Other Ambulatory Visit: Payer: Self-pay

## 2019-07-08 DIAGNOSIS — E875 Hyperkalemia: Secondary | ICD-10-CM | POA: Diagnosis not present

## 2019-07-08 DIAGNOSIS — M109 Gout, unspecified: Secondary | ICD-10-CM | POA: Diagnosis present

## 2019-07-08 DIAGNOSIS — K219 Gastro-esophageal reflux disease without esophagitis: Secondary | ICD-10-CM | POA: Diagnosis present

## 2019-07-08 DIAGNOSIS — Q231 Congenital insufficiency of aortic valve: Secondary | ICD-10-CM | POA: Diagnosis not present

## 2019-07-08 DIAGNOSIS — U071 COVID-19: Principal | ICD-10-CM

## 2019-07-08 DIAGNOSIS — I129 Hypertensive chronic kidney disease with stage 1 through stage 4 chronic kidney disease, or unspecified chronic kidney disease: Secondary | ICD-10-CM | POA: Diagnosis present

## 2019-07-08 DIAGNOSIS — J9601 Acute respiratory failure with hypoxia: Secondary | ICD-10-CM | POA: Diagnosis present

## 2019-07-08 DIAGNOSIS — R7989 Other specified abnormal findings of blood chemistry: Secondary | ICD-10-CM | POA: Diagnosis not present

## 2019-07-08 DIAGNOSIS — Z8249 Family history of ischemic heart disease and other diseases of the circulatory system: Secondary | ICD-10-CM

## 2019-07-08 DIAGNOSIS — J1289 Other viral pneumonia: Secondary | ICD-10-CM | POA: Diagnosis present

## 2019-07-08 DIAGNOSIS — R197 Diarrhea, unspecified: Secondary | ICD-10-CM | POA: Diagnosis present

## 2019-07-08 DIAGNOSIS — R0902 Hypoxemia: Secondary | ICD-10-CM

## 2019-07-08 DIAGNOSIS — I10 Essential (primary) hypertension: Secondary | ICD-10-CM

## 2019-07-08 DIAGNOSIS — Z888 Allergy status to other drugs, medicaments and biological substances status: Secondary | ICD-10-CM | POA: Diagnosis not present

## 2019-07-08 DIAGNOSIS — N183 Chronic kidney disease, stage 3 unspecified: Secondary | ICD-10-CM

## 2019-07-08 DIAGNOSIS — R0602 Shortness of breath: Secondary | ICD-10-CM | POA: Diagnosis present

## 2019-07-08 DIAGNOSIS — Z833 Family history of diabetes mellitus: Secondary | ICD-10-CM | POA: Diagnosis not present

## 2019-07-08 DIAGNOSIS — Z800 Family history of malignant neoplasm of digestive organs: Secondary | ICD-10-CM

## 2019-07-08 DIAGNOSIS — N179 Acute kidney failure, unspecified: Secondary | ICD-10-CM | POA: Diagnosis present

## 2019-07-08 DIAGNOSIS — R0603 Acute respiratory distress: Secondary | ICD-10-CM

## 2019-07-08 HISTORY — DX: Essential (primary) hypertension: I10

## 2019-07-08 LAB — CBC WITH DIFFERENTIAL/PLATELET
Abs Immature Granulocytes: 0.09 10*3/uL — ABNORMAL HIGH (ref 0.00–0.07)
Basophils Absolute: 0 10*3/uL (ref 0.0–0.1)
Basophils Relative: 0 %
Eosinophils Absolute: 0 10*3/uL (ref 0.0–0.5)
Eosinophils Relative: 0 %
HCT: 29.9 % — ABNORMAL LOW (ref 39.0–52.0)
Hemoglobin: 10.5 g/dL — ABNORMAL LOW (ref 13.0–17.0)
Immature Granulocytes: 1 %
Lymphocytes Relative: 4 %
Lymphs Abs: 0.4 10*3/uL — ABNORMAL LOW (ref 0.7–4.0)
MCH: 25.7 pg — ABNORMAL LOW (ref 26.0–34.0)
MCHC: 35.1 g/dL (ref 30.0–36.0)
MCV: 73.3 fL — ABNORMAL LOW (ref 80.0–100.0)
Monocytes Absolute: 0.4 10*3/uL (ref 0.1–1.0)
Monocytes Relative: 5 %
Neutro Abs: 7.6 10*3/uL (ref 1.7–7.7)
Neutrophils Relative %: 90 %
Platelets: 386 10*3/uL (ref 150–400)
RBC: 4.08 MIL/uL — ABNORMAL LOW (ref 4.22–5.81)
RDW: 13.2 % (ref 11.5–15.5)
WBC: 8.5 10*3/uL (ref 4.0–10.5)
nRBC: 0 % (ref 0.0–0.2)

## 2019-07-08 LAB — COMPREHENSIVE METABOLIC PANEL
ALT: 85 U/L — ABNORMAL HIGH (ref 0–44)
AST: 92 U/L — ABNORMAL HIGH (ref 15–41)
Albumin: 2.9 g/dL — ABNORMAL LOW (ref 3.5–5.0)
Alkaline Phosphatase: 84 U/L (ref 38–126)
Anion gap: 15 (ref 5–15)
BUN: 42 mg/dL — ABNORMAL HIGH (ref 8–23)
CO2: 21 mmol/L — ABNORMAL LOW (ref 22–32)
Calcium: 8.4 mg/dL — ABNORMAL LOW (ref 8.9–10.3)
Chloride: 98 mmol/L (ref 98–111)
Creatinine, Ser: 2.17 mg/dL — ABNORMAL HIGH (ref 0.61–1.24)
GFR calc Af Amer: 36 mL/min — ABNORMAL LOW (ref >60)
GFR calc non Af Amer: 31 mL/min — ABNORMAL LOW (ref >60)
Glucose, Bld: 126 mg/dL — ABNORMAL HIGH (ref 70–99)
Potassium: 4.1 mmol/L (ref 3.5–5.1)
Sodium: 134 mmol/L — ABNORMAL LOW (ref 135–145)
Total Bilirubin: 0.9 mg/dL (ref 0.3–1.2)
Total Protein: 7 g/dL (ref 6.5–8.1)

## 2019-07-08 LAB — URINALYSIS, ROUTINE W REFLEX MICROSCOPIC
Bilirubin Urine: NEGATIVE
Glucose, UA: NEGATIVE mg/dL
Ketones, ur: NEGATIVE mg/dL
Leukocytes,Ua: NEGATIVE
Nitrite: NEGATIVE
Protein, ur: 100 mg/dL — AB
Specific Gravity, Urine: 1.019 (ref 1.005–1.030)
pH: 5 (ref 5.0–8.0)

## 2019-07-08 LAB — TRIGLYCERIDES: Triglycerides: 170 mg/dL — ABNORMAL HIGH (ref <150)

## 2019-07-08 LAB — SARS CORONAVIRUS 2 BY RT PCR (HOSPITAL ORDER, PERFORMED IN ~~LOC~~ HOSPITAL LAB): SARS Coronavirus 2: POSITIVE — AB

## 2019-07-08 LAB — FIBRINOGEN: Fibrinogen: >800 mg/dL — ABNORMAL HIGH (ref 210–475)

## 2019-07-08 LAB — C-REACTIVE PROTEIN: CRP: 23.5 mg/dL — ABNORMAL HIGH (ref <1.0)

## 2019-07-08 LAB — D-DIMER, QUANTITATIVE: D-Dimer, Quant: 5.15 ug/mL-FEU — ABNORMAL HIGH (ref 0.00–0.50)

## 2019-07-08 LAB — FERRITIN: Ferritin: 1293 ng/mL — ABNORMAL HIGH (ref 24–336)

## 2019-07-08 LAB — LACTATE DEHYDROGENASE: LDH: 594 U/L — ABNORMAL HIGH (ref 98–192)

## 2019-07-08 LAB — BRAIN NATRIURETIC PEPTIDE: B Natriuretic Peptide: 69.1 pg/mL (ref 0.0–100.0)

## 2019-07-08 LAB — PROCALCITONIN: Procalcitonin: 3.57 ng/mL

## 2019-07-08 LAB — LACTIC ACID, PLASMA: Lactic Acid, Venous: 1.5 mmol/L (ref 0.5–1.9)

## 2019-07-08 MED ORDER — VITAMIN D 25 MCG (1000 UNIT) PO TABS
1000.0000 [IU] | ORAL_TABLET | Freq: Every day | ORAL | Status: DC
  Administered 2019-07-09 – 2019-07-15 (×7): 1000 [IU] via ORAL
  Filled 2019-07-08 (×8): qty 1

## 2019-07-08 MED ORDER — SODIUM CHLORIDE 0.9 % IV BOLUS
1000.0000 mL | Freq: Once | INTRAVENOUS | Status: AC
  Administered 2019-07-08: 1000 mL via INTRAVENOUS

## 2019-07-08 MED ORDER — SODIUM CHLORIDE 0.9 % IV SOLN
500.0000 mg | INTRAVENOUS | Status: AC
  Administered 2019-07-08 – 2019-07-12 (×5): 500 mg via INTRAVENOUS
  Filled 2019-07-08 (×5): qty 500

## 2019-07-08 MED ORDER — LACTATED RINGERS IV SOLN
INTRAVENOUS | Status: AC
  Administered 2019-07-08: 23:00:00 via INTRAVENOUS

## 2019-07-08 MED ORDER — HYDROCOD POLST-CPM POLST ER 10-8 MG/5ML PO SUER
5.0000 mL | Freq: Two times a day (BID) | ORAL | Status: DC | PRN
  Administered 2019-07-09 – 2019-07-10 (×2): 5 mL via ORAL
  Filled 2019-07-08 (×2): qty 5

## 2019-07-08 MED ORDER — DEXAMETHASONE SODIUM PHOSPHATE 10 MG/ML IJ SOLN
6.0000 mg | Freq: Every day | INTRAMUSCULAR | Status: DC
  Administered 2019-07-08 – 2019-07-09 (×2): 6 mg via INTRAVENOUS
  Filled 2019-07-08 (×2): qty 1

## 2019-07-08 MED ORDER — SODIUM CHLORIDE 0.9 % IV SOLN
2.0000 g | INTRAVENOUS | Status: AC
  Administered 2019-07-08 – 2019-07-12 (×5): 2 g via INTRAVENOUS
  Filled 2019-07-08 (×5): qty 20

## 2019-07-08 MED ORDER — VITAMIN C 500 MG PO TABS
500.0000 mg | ORAL_TABLET | Freq: Every day | ORAL | Status: DC
  Administered 2019-07-09 – 2019-07-15 (×7): 500 mg via ORAL
  Filled 2019-07-08 (×8): qty 1

## 2019-07-08 MED ORDER — ACETAMINOPHEN 650 MG RE SUPP: 650.0000 mg | Freq: Four times a day (QID) | RECTAL | Status: DC | PRN

## 2019-07-08 MED ORDER — GUAIFENESIN-DM 100-10 MG/5ML PO SYRP
10.0000 mL | ORAL_SOLUTION | ORAL | Status: DC | PRN
  Administered 2019-07-09: 10 mL via ORAL
  Filled 2019-07-08: qty 10

## 2019-07-08 MED ORDER — SODIUM CHLORIDE 0.9 % IV SOLN
200.0000 mg | Freq: Once | INTRAVENOUS | Status: AC
  Administered 2019-07-08: 200 mg via INTRAVENOUS
  Filled 2019-07-08: qty 40

## 2019-07-08 MED ORDER — HEPARIN SODIUM (PORCINE) 5000 UNIT/ML IJ SOLN
7500.0000 [IU] | Freq: Three times a day (TID) | INTRAMUSCULAR | Status: DC
  Administered 2019-07-09 – 2019-07-12 (×11): 7500 [IU] via SUBCUTANEOUS
  Filled 2019-07-08 (×11): qty 2

## 2019-07-08 MED ORDER — ACETAMINOPHEN 325 MG PO TABS: 650.0000 mg | ORAL_TABLET | Freq: Four times a day (QID) | ORAL | Status: DC | PRN

## 2019-07-08 MED ORDER — ZINC SULFATE 220 (50 ZN) MG PO CAPS
220.0000 mg | ORAL_CAPSULE | Freq: Every day | ORAL | Status: DC
  Administered 2019-07-09 – 2019-07-15 (×7): 220 mg via ORAL
  Filled 2019-07-08 (×8): qty 1

## 2019-07-08 MED ORDER — SODIUM CHLORIDE 0.9 % IV SOLN
100.0000 mg | INTRAVENOUS | Status: AC
  Administered 2019-07-09 – 2019-07-12 (×4): 100 mg via INTRAVENOUS
  Filled 2019-07-08 (×4): qty 20

## 2019-07-08 NOTE — ED Notes (Signed)
Report given to [place], [facility] room [phone]

## 2019-07-08 NOTE — H&P (Signed)
History and Physical    Eric PostJerome J Masur ZOX:[id number] DOB: Aug 13, 1956 DOA: 07/08/2019  PCP: Elias Elseeade, Robert, MD Patient coming from: home  I have personally briefly reviewed patient's old medical [facility]  Chief Complaint: SOB  HPI: Eric [name] is Eric [name] 63 y.o. male with medical history significant of HTN, aortic insufficiency, GERD presenting with SOB.  Pt notes symptoms started Eric [name] little over 1 week ago.  Developed fatigue, decreased appetite, diarrhea.  His daughter had similar symptoms and was tested for covid and was negative.  Symptoms have gotten progressively worse with SOB and fever over the past 3-4 days.  He was prescribed azithromycin 3 days ago.  Notes fever, cough, sob.  Decreased appetite.  No N/V or abdominal pain.  Some diarrhea.  No smoking, no etoh. .  ED Course: Labs, imaging.  COVID 19 positive.  Admit to hospitalist.   Review of Systems: As per HPI otherwise 10 point review of systems negative.   Past Medical History:  Diagnosis Date  . Allergy    ALLERGIC RHINITIS..WORSE SPRING/FALL  . Aortic valve regurgitation 07/10/14   ECHO @ [facility]  . GERD (gastroesophageal reflux disease)    CURRENTLY DIET CONTROLLED  . Sinusitis, chronic     History reviewed. No pertinent surgical history.   reports that he has never smoked. He has never used smokeless tobacco. He reports current alcohol use. He reports that he does not use drugs.  Allergies  Allergen Reactions  . Amlodipine Cough    Family History  Problem Relation Age of Onset  . Hypertension Mother   . Cancer Father        STOMACH  . Diabetes Daughter   . Cancer Maternal Grandmother        STOMACH  . Heart disease Maternal Grandmother   . Cancer Brother        COLON  . Diabetes Brother    Prior to Admission medications   Medication Sig Start Date End Date Taking? Authorizing Provider  allopurinol (ZYLOPRIM) 300 MG tablet Take 300 mg by mouth daily. 09/21/18  Yes [provider]  valsartan-hydrochlorothiazide (DIOVAN-HCT) 320-12.5 MG tablet TAKE 1 TABLET BY MOUTH DAILY 11/06/18  Yes Lyn RecordsSmith, Henry W, MD    Physical Exam: Vitals:   07/08/19 1230 07/08/19 1300 07/08/19 1330 07/08/19 1400  BP: (!) 117/52 (!) 118/53 (!) 125/58 129/65  Pulse: 87 90 91 88  Resp: (!) 29 (!) 23 (!) 35 (!) 38  Temp:      TempSrc:      SpO2: 98% 96% 97% 97%  Weight:        Constitutional: NAD, calm, comfortable Vitals:   07/08/19 1230 07/08/19 1300 07/08/19 1330 07/08/19 1400  BP: (!) 117/52 (!) 118/53 (!) 125/58 129/65  Pulse: 87 90 91 88  Resp: (!) 29 (!) 23 (!) 35 (!) 38  Temp:      TempSrc:      SpO2: 98% 96% 97% 97%  Weight:       Eyes: PERRL, lids and conjunctivae normal ENMT: Mucous membranes are moist. Posterior pharynx clear of any exudate or lesions.Normal dentition.  Neck: normal, supple, no masses, no thyromegaly Respiratory: tachypneic, on 5 L [place]  Cardiovascular: Regular rate and rhythm.   Abdomen: no tenderness, no masses palpated. No hepatosplenomegaly. Bowel sounds positive.  Musculoskeletal: no clubbing / cyanosis. No joint deformity upper and lower extremities. Good ROM, no contractures. Normal muscle tone.  Skin: no rashes, lesions, ulcers. No induration Neurologic:  CN 2-12 grossly intact. Sensation intact. Moving all extremities. Psychiatric: Normal judgment and insight. Alert and oriented x 3. Normal mood.   Labs on Admission: I have personally reviewed following labs and imaging studies  CBC: Recent Labs  Lab 07/08/19 1155  WBC 8.5  NEUTROABS 7.6  HGB 10.5*  HCT 29.9*  MCV 73.3*  PLT 785   Basic Metabolic Panel: Recent Labs  Lab 07/08/19 1155  NA 134*  K 4.1  CL 98  CO2 21*  GLUCOSE 126*  BUN 42*  CREATININE 2.17*  CALCIUM 8.4*   GFR: CrCl cannot be calculated (Unknown ideal weight.). Liver Function Tests: Recent Labs  Lab 07/08/19 1155  AST 92*  ALT 85*  ALKPHOS 84  BILITOT 0.9  PROT 7.0  ALBUMIN 2.9*   No  results for input(s): LIPASE, AMYLASE in the last 168 hours. No results for input(s): AMMONIA in the last 168 hours. Coagulation Profile: No results for input(s): INR, PROTIME in the last 168 hours. Cardiac Enzymes: No results for input(s): CKTOTAL, CKMB, CKMBINDEX, TROPONINI in the last 168 hours. BNP (last 3 results) No results for input(s): PROBNP in the last 8760 hours. HbA1C: No results for input(s): HGBA1C in the last 72 hours. CBG: No results for input(s): GLUCAP in the last 168 hours. Lipid Profile: Recent Labs    07/08/19 1156  TRIG 170*   Thyroid Function Tests: No results for input(s): TSH, T4TOTAL, FREET4, T3FREE, THYROIDAB in the last 72 hours. Anemia Panel: Recent Labs    07/08/19 1122  FERRITIN 1,293*   Urine analysis: No results found for: COLORURINE, APPEARANCEUR, LABSPEC, PHURINE, GLUCOSEU, HGBUR, BILIRUBINUR, KETONESUR, PROTEINUR, UROBILINOGEN, NITRITE, LEUKOCYTESUR  Radiological Exams on Admission: Dg Chest Port 1 View  Result Date: 07/08/2019 CLINICAL DATA:  Cough, shortness of breath. EXAM: PORTABLE CHEST 1 VIEW COMPARISON:  Radiographs of August 24, 2015. FINDINGS: Stable cardiomediastinal silhouette. No pneumothorax is noted. Mild bilateral perihilar and basilar opacities are noted concerning for atelectasis or infiltrates. Small left pleural effusion may be present. Bony thorax is unremarkable. IMPRESSION: Mild bilateral perihilar and basilar opacities are noted concerning for atelectasis or possibly multifocal pneumonia. Small left pleural effusion is noted. Electronically Signed   By: Marijo Conception M.D.   On: 07/08/2019 11:59    EKG: Independently reviewed. Sinus rhythm.  Compared to priors.  Assessment/Plan Active Problems:   COVID-19 virus infection   Acute Hypoxic Respiratory Failure 2/2 COVID 19 Pneumonia: about 1 week of sx, starting with decreased appetite, diarrhea, and then 3 days of SOB.  He was started on azithromycin about 3 days ago.  Hypoxic, requiring 5 L on presentation Start dexamethasone Start remdesivir D dimer >5 -- high dose heparin ppx Procalcitonin is elevated to 3.57.  Will start CAP coverage with ceftriaxone and continue azithromycin.  Prone as able Follow inflammatory markers Blood cx pending.  Sputum, urine strep, legionella, HIV. I/O, daily weights.  Goal euvolemic.  Gentle IVF given AKI below.  AKI on CKD III: baseline creatinine appears to be ~1.7.  Up to 2.17 today.  Follow after IVF.  Hold HCTZ and arb.  Functionally Bicuspid AV with mild AS and severe AI: continue to monitor, follows with Dr. Tamala Julian outpatient  Hypertension: hold home BP meds  Gout: continue allopurinol   DVT prophylaxis: high dose heparin ppx Code Status: full  Family Communication: wife Disposition Plan: pending  Consults called: none Admission status: inpatient   Fayrene Helper MD [facility] Pager [email or web address]  If 7PM-7AM, please contact night-coverage [email or web address] Password  [email or web address]  07/08/2019, 2:07 PM

## 2019-07-08 NOTE — Progress Notes (Signed)
Kellan Raffield (spouse) called on updates on patient.

## 2019-07-08 NOTE — ED Notes (Signed)
[facility] called. 

## 2019-07-08 NOTE — ED Notes (Signed)
ED Provider at bedside. 

## 2019-07-08 NOTE — ED Triage Notes (Signed)
Pt states he has had cough and SHOB x 3 days. Denies N/V/D. Pt states low grade fever at home.

## 2019-07-08 NOTE — ED Provider Notes (Addendum)
[place] DEPT Provider Note   CSN: [id number] Arrival date & time: 07/08/19  1058     History   Chief Complaint Chief Complaint  Patient presents with  . Shortness of Breath    HPI CADIN LUKA is a 63 y.o. male.     Patient is a 63 year old male with past medical history of hypertension, GERD, aortic insufficiency.  He presents today for evaluation of cough and shortness of breath.  This is been worsening over the past 3 days.  He reports his cough is intermittently productive.  He denies any chest pain.  The history is provided by the patient.  Shortness of Breath   Past Medical History:  Diagnosis Date  . Allergy    ALLERGIC RHINITIS..WORSE SPRING/FALL  . Aortic valve regurgitation 07/10/14   ECHO @ [place]  . GERD (gastroesophageal reflux disease)    CURRENTLY DIET CONTROLLED  . Sinusitis, chronic     Patient Active Problem List   Diagnosis Date Noted  . Essential hypertension 08/31/2017  . 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 GERD (gastroesophageal reflux disease)   . Allergy   . Sinusitis, chronic   . Aortic valve regurgitation 07/10/2014    History reviewed. No pertinent surgical history.      Home Medications    Prior to Admission medications   Medication Sig Start Date End Date Taking? Authorizing Provider  allopurinol (ZYLOPRIM) 300 MG tablet Take 300 mg by mouth daily. 09/21/18   [provider]  Cetirizine HCl (ZYRTEC ALLERGY PO) Take 1 tablet by mouth daily as needed (allergies).     [provider]  valsartan-hydrochlorothiazide (DIOVAN-HCT) 320-12.5 MG tablet TAKE 1 TABLET BY MOUTH DAILY 11/06/18   Belva Crome, MD    Family History Family History  Problem Relation Age of Onset  . Hypertension Mother   . Cancer Father        STOMACH  . Diabetes Daughter   . Cancer Maternal Grandmother        STOMACH  . Heart disease Maternal  Grandmother   . Cancer Brother        COLON  . Diabetes Brother     Social History Social History   Tobacco Use  . Smoking status: Never Smoker  . Smokeless tobacco: Never Used  Substance Use Topics  . Alcohol use: Yes    Alcohol/week: 0.0 standard drinks    Comment: occasional  . Drug use: No     Allergies   Amlodipine   Review of Systems Review of Systems  Respiratory: Positive for shortness of breath.      Physical Exam Updated Vital Signs BP 133/61   Pulse 96   Temp 99.8 F (37.7 C) (Oral)   Resp (!) 26   Wt 94.3 kg   SpO2 92%   BMI 29.84 kg/m   Physical Exam Vitals signs and nursing note reviewed.  Constitutional:      General: He is not in acute distress.    Appearance: He is well-developed. He is not diaphoretic.  HENT:     Head: Normocephalic and atraumatic.  Neck:     Musculoskeletal: Normal range of motion and neck supple.  Cardiovascular:     Rate and Rhythm: Normal rate and regular rhythm.     Heart sounds: No murmur. No friction rub.  Pulmonary:     Effort: Pulmonary effort is normal. No respiratory distress.     Breath  sounds: Examination of the right-middle field reveals rhonchi. Examination of the left-middle field reveals rhonchi. Rhonchi present. No wheezing or rales.  Abdominal:     General: Bowel sounds are normal. There is no distension.     Palpations: Abdomen is soft.     Tenderness: There is no abdominal tenderness.  Musculoskeletal: Normal range of motion.     Right lower leg: He exhibits no tenderness. No edema.     Left lower leg: He exhibits no tenderness. No edema.  Skin:    General: Skin is warm and dry.  Neurological:     Mental Status: He is alert and oriented to person, place, and time.     Coordination: Coordination normal.      ED Treatments / Results  Labs (all labs ordered are listed, but only abnormal results are displayed) Labs Reviewed  SARS CORONAVIRUS 2 (HOSPITAL ORDER, PERFORMED IN [facility])  CULTURE, BLOOD (ROUTINE X 2)  CULTURE, BLOOD (ROUTINE X 2)  LACTIC ACID, PLASMA  LACTIC ACID, PLASMA  CBC WITH DIFFERENTIAL/PLATELET  COMPREHENSIVE METABOLIC PANEL  D-DIMER, QUANTITATIVE (NOT AT [facility])  PROCALCITONIN  LACTATE DEHYDROGENASE  FERRITIN  TRIGLYCERIDES  FIBRINOGEN  C-REACTIVE PROTEIN  BRAIN NATRIURETIC PEPTIDE    EKG ED ECG REPORT   Date: 07/08/2019  Rate: 97  Rhythm: normal sinus rhythm  QRS Axis: normal  Intervals: normal  ST/T Wave abnormalities: normal  Conduction Disutrbances:none  Narrative Interpretation:   Old EKG Reviewed: none available  I have personally reviewed the EKG tracing and agree with the computerized printout as noted.   Radiology No results found.  Procedures Procedures (including critical care time)  Medications Ordered in ED Medications - No data to display   Initial Impression / Assessment and Plan / ED Course  I have reviewed the triage vital signs and the nursing notes.  Pertinent labs & imaging results that were available during my care of the patient were reviewed by me and considered in my medical decision making (see chart for details).  Patient presenting here with a 3-day history of cough, weakness, and shortness of breath.  Patient initially hypoxic with oxygen saturations of 80% upon presentation.  Patient is symptoms concerning for COVID-19.  Work-up initiated into this possibility revealing a positive COVID test.  He also has acute renal injury as well as significant elevations of his inflammatory markers.  Patient given 1 L of normal saline.  Patient requiring supplemental oxygen and will require admission.  I have spoken with Dr. Lowell GuitarPowell who [facility].  CRITICAL CARE Performed by: Geoffery Lyonsouglas Sael Furches Total critical care time: 35 minutes Critical care time was exclusive of separately billable procedures and treating other patients. Critical care was necessary to  treat or prevent imminent or life-threatening deterioration. Critical care was time spent personally by me on the following activities: development of treatment plan with patient and/or surrogate as well as nursing, discussions with consultants, evaluation of patient's response to treatment, examination of patient, obtaining history from patient or surrogate, ordering and performing treatments and interventions, ordering and review of laboratory studies, ordering and review of radiographic studies, pulse oximetry and re-evaluation of patient's condition.   Final Clinical Impressions(s) / ED Diagnoses   Final diagnoses:  None    ED Discharge Orders    None       Geoffery Lyonselo, Jerremy Maione, MD 07/08/19 1401    Geoffery Lyonselo, Ainara Eldridge, MD 07/19/19 1505

## 2019-07-08 NOTE — Progress Notes (Signed)
Pharmacy: Remdesivir   Patient is a 63 y.o. M with COVID.  Pharmacy has been consulted for remdesivir dosing.   - ALT: 85 - CXR shows: "Mild bilateral perihilar and basilar opacities are noted concerning for atelectasis or possibly multifocal pneumonia. Small left pleural effusion is noted." - Pt is requiring supplemental oxygen: 5 L/min    A/P:  - Patient meets criteria for remdesivir. Will initiate remdesivir 200 mg once followed by 100 mg daily x 4 days.  - Daily CMET while on remdesivir - Will f/u pt's ALT and clinical condition  Dia Sitter, PharmD, BCPS 07/08/2019 1:50 PM

## 2019-07-08 NOTE — ED Notes (Signed)
Date and time results received: 07/08/19 1:13 PM  Test: covid Critical Value: positive  Name of Provider Notified: Delo MD  Orders Received? Or Actions Taken?: acknowledges result

## 2019-07-08 NOTE — ED Notes (Signed)
[facility] bedside.  

## 2019-07-08 NOTE — ED Notes (Signed)
Pt 78% RA- O2 applied. Pt 93% on 5L [place].

## 2019-07-09 DIAGNOSIS — N183 Chronic kidney disease, stage 3 unspecified: Secondary | ICD-10-CM

## 2019-07-09 DIAGNOSIS — J9601 Acute respiratory failure with hypoxia: Secondary | ICD-10-CM

## 2019-07-09 DIAGNOSIS — N179 Acute kidney failure, unspecified: Secondary | ICD-10-CM

## 2019-07-09 DIAGNOSIS — I10 Essential (primary) hypertension: Secondary | ICD-10-CM

## 2019-07-09 LAB — COMPREHENSIVE METABOLIC PANEL
ALT: 75 U/L — ABNORMAL HIGH (ref 0–44)
AST: 67 U/L — ABNORMAL HIGH (ref 15–41)
Albumin: 2.6 g/dL — ABNORMAL LOW (ref 3.5–5.0)
Alkaline Phosphatase: 78 U/L (ref 38–126)
Anion gap: 12 (ref 5–15)
BUN: 44 mg/dL — ABNORMAL HIGH (ref 8–23)
CO2: 22 mmol/L (ref 22–32)
Calcium: 8.3 mg/dL — ABNORMAL LOW (ref 8.9–10.3)
Chloride: 101 mmol/L (ref 98–111)
Creatinine, Ser: 1.87 mg/dL — ABNORMAL HIGH (ref 0.61–1.24)
GFR calc Af Amer: 44 mL/min — ABNORMAL LOW (ref >60)
GFR calc non Af Amer: 38 mL/min — ABNORMAL LOW (ref >60)
Glucose, Bld: 168 mg/dL — ABNORMAL HIGH (ref 70–99)
Potassium: 4.9 mmol/L (ref 3.5–5.1)
Sodium: 135 mmol/L (ref 135–145)
Total Bilirubin: 0.4 mg/dL (ref 0.3–1.2)
Total Protein: 6.6 g/dL (ref 6.5–8.1)

## 2019-07-09 LAB — EXPECTORATED SPUTUM ASSESSMENT W GRAM STAIN, RFLX TO RESP C

## 2019-07-09 LAB — CBC WITH DIFFERENTIAL/PLATELET
Abs Immature Granulocytes: 0.07 10*3/uL (ref 0.00–0.07)
Basophils Absolute: 0 10*3/uL (ref 0.0–0.1)
Basophils Relative: 0 %
Eosinophils Absolute: 0 10*3/uL (ref 0.0–0.5)
Eosinophils Relative: 0 %
HCT: 28 % — ABNORMAL LOW (ref 39.0–52.0)
Hemoglobin: 9.8 g/dL — ABNORMAL LOW (ref 13.0–17.0)
Immature Granulocytes: 2 %
Lymphocytes Relative: 8 %
Lymphs Abs: 0.3 10*3/uL — ABNORMAL LOW (ref 0.7–4.0)
MCH: 25.8 pg — ABNORMAL LOW (ref 26.0–34.0)
MCHC: 35 g/dL (ref 30.0–36.0)
MCV: 73.7 fL — ABNORMAL LOW (ref 80.0–100.0)
Monocytes Absolute: 0.2 10*3/uL (ref 0.1–1.0)
Monocytes Relative: 5 %
Neutro Abs: 3.8 10*3/uL (ref 1.7–7.7)
Neutrophils Relative %: 85 %
Platelets: 430 10*3/uL — ABNORMAL HIGH (ref 150–400)
RBC: 3.8 MIL/uL — ABNORMAL LOW (ref 4.22–5.81)
RDW: 13.4 % (ref 11.5–15.5)
WBC: 4.4 10*3/uL (ref 4.0–10.5)
nRBC: 0 % (ref 0.0–0.2)

## 2019-07-09 LAB — FERRITIN: Ferritin: 1115 ng/mL — ABNORMAL HIGH (ref 24–336)

## 2019-07-09 LAB — ABO/RH: ABO/RH(D): B POS

## 2019-07-09 LAB — HIV ANTIBODY (ROUTINE TESTING W REFLEX): HIV Screen 4th Generation wRfx: NONREACTIVE

## 2019-07-09 LAB — PROCALCITONIN: Procalcitonin: 3.28 ng/mL

## 2019-07-09 LAB — D-DIMER, QUANTITATIVE: D-Dimer, Quant: 5.88 ug/mL-FEU — ABNORMAL HIGH (ref 0.00–0.50)

## 2019-07-09 LAB — TRIGLYCERIDES: Triglycerides: 116 mg/dL (ref <150)

## 2019-07-09 LAB — MAGNESIUM: Magnesium: 2.6 mg/dL — ABNORMAL HIGH (ref 1.7–2.4)

## 2019-07-09 LAB — STREP PNEUMONIAE URINARY ANTIGEN: Strep Pneumo Urinary Antigen: NEGATIVE

## 2019-07-09 LAB — C-REACTIVE PROTEIN: CRP: 22.2 mg/dL — ABNORMAL HIGH (ref <1.0)

## 2019-07-09 NOTE — Plan of Care (Addendum)
Patient in bed resting. No s/s of pain or distress. Medication given well tolerated. Will continue to monitor for remainder of shift. Spoke to patients and gave her an update.    Problem: Health Behavior/Discharge Planning: Goal: Ability to manage health-related needs will improve Outcome: Progressing   Problem: Clinical Measurements: Goal: Ability to maintain clinical measurements within normal limits will improve Outcome: Progressing Goal: Will remain free from infection Outcome: Progressing Goal: Diagnostic test results will improve Outcome: Progressing Goal: Respiratory complications will improve Outcome: Progressing Goal: Cardiovascular complication will be avoided Outcome: Progressing   Problem: Activity: Goal: Risk for activity intolerance will decrease Outcome: Progressing   Problem: Nutrition: Goal: Adequate nutrition will be maintained Outcome: Progressing   Problem: Coping: Goal: Level of anxiety will decrease Outcome: Progressing   Problem: Elimination: Goal: Will not experience complications related to bowel motility Outcome: Progressing Goal: Will not experience complications related to urinary retention Outcome: Progressing   Problem: Pain Managment: Goal: General experience of comfort will improve Outcome: Progressing   Problem: Safety: Goal: Ability to remain free from injury will improve Outcome: Progressing   Problem: Skin Integrity: Goal: Risk for impaired skin integrity will decrease Outcome: Progressing   Problem: Education: Goal: Knowledge of risk factors and measures for prevention of condition will improve Outcome: Progressing   Problem: Coping: Goal: Psychosocial and spiritual needs will be supported Outcome: Progressing   Problem: Respiratory: Goal: Will maintain a patent airway Outcome: Progressing Goal: Complications related to the disease process, condition or treatment will be avoided or minimized Outcome: Progressing

## 2019-07-09 NOTE — Progress Notes (Signed)
[facility] PROGRESS NOTE    Progress Note  Eric PostJerome J Nakajima  ZOX:[id number] DOB: Apr 13, 1956 DOA: 07/08/2019 PCP: Elias Elseeade, Robert, MD     Brief Narrative:   Eric [name] is an 63 y.o. male past medical history of hypertension, aortic insufficiency presents with shortness of breath and fever that started 4 days prior to admission.  She relates it all started with a little bit of fatigue and decreased appetite accompanied by diarrhea.  Daughter had similar symptoms but tested negative for SARS-CoV-2.  She saw her primary care doctor when the shortness of breath started and prescribed her azithromycin for 3 days.  Assessment/Plan:   Acute respiratory failure with hypoxia due to covid-19: He is requiring 5 L nasal cannula to keep saturations above 92%. His multiple risk factors including age for ARDS and multiorgan failure. His procalcitonin is 3.5, his chest x-ray showed bilateral infiltrates, he has no leukocytosis has remained afebrile. He was started empirically on IV Rocephin and azithromycin, for possible community-acquired pneumonia. He was also started on IV Remdesivir and Decadron.  Acute kidney injury on chronic kidney disease stage III: With a baseline creatinine around 1.7. Likely prerenal azotemia in the setting of diuretic and ARB, these were held in admission. He was started on IV fluid hydration his creatinine is slowly improving.  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   DVT prophylaxis: lovenox Family Communication:none Disposition Plan/Barrier to D/C: once off oxygen Code Status:     Code Status Orders  (From admission, onward)         Start     Ordered   07/08/19 2210  Full code  Continuous     07/08/19 2209        Code Status History    This patient has a current code status but no  historical code status.   Advance Care Planning Activity    Advance Directive Documentation     Most Recent Value  Type of Advance Directive  Healthcare Power of Attorney, Living will  Pre-existing out of facility DNR order (yellow form or pink MOST form)  -  "MOST" Form in Place?  -        IV Access:    Peripheral IV   Procedures and diagnostic studies:   Dg Chest Port 1 View  Result Date: 07/08/2019 CLINICAL DATA:  Cough, shortness of breath. EXAM: PORTABLE CHEST 1 VIEW COMPARISON:  Radiographs of August 24, 2015. FINDINGS: Stable cardiomediastinal silhouette. No pneumothorax is noted. Mild bilateral perihilar and basilar opacities are noted concerning for atelectasis or infiltrates. Small left pleural effusion may be present. Bony thorax is unremarkable. IMPRESSION: Mild bilateral perihilar and basilar opacities are noted concerning for atelectasis or possibly multifocal pneumonia. Small left pleural effusion is noted. Electronically Signed   By: Lupita RaiderJames  Green Jr M.D.   On: 07/08/2019 11:59     Medical Consultants:    None.  Anti-Infectives:   Remdesivir  Subjective:    Eric [name] no complains  Objective:    Vitals:   07/09/19 0424 07/09/19 0500 07/09/19 0600 07/09/19 0700  BP: 124/70     Pulse: 72 69 71 78  Resp: (!) 26 (!) 28 (!) 30 19  Temp: 97.8 F (36.6 C)     TempSrc: Oral     SpO2: 95% 96% 96% (!) 88%  Weight:      Height:  SpO2: (!) 88 % O2 Flow Rate (L/min): 5 L/min   Intake/Output Summary (Last 24 hours) at 07/09/2019 0716 Last data filed at 07/09/2019 0329 Gross per 24 hour  Intake 577 ml  Output 200 ml  Net 377 ml   Filed Weights   07/08/19 1107 07/08/19 2152  Weight: 94.3 kg 94.6 kg    Exam: General exam: In no acute distress. Respiratory system: Good air movement and crackles Cardiovascular system: S1 & S2 heard, RRR. No JVD. Gastrointestinal system: Abdomen is nondistended, soft and nontender.  Central nervous system:  Alert and oriented. No focal neurological deficits. Extremities: No pedal edema. Skin: No rashes, lesions or ulcers Psychiatry: Judgement and insight appear normal. Mood & affect appropriate.    Data Reviewed:    Labs: Basic Metabolic Panel: Recent Labs  Lab 07/08/19 1155 07/09/19 0205  NA 134* 135  K 4.1 4.9  CL 98 101  CO2 21* 22  GLUCOSE 126* 168*  BUN 42* 44*  CREATININE 2.17* 1.87*  CALCIUM 8.4* 8.3*  MG  --  2.6*   GFR Estimated Creatinine Clearance: 47.3 mL/min (A) (by C-G formula based on SCr of 1.87 mg/dL (H)). Liver Function Tests: Recent Labs  Lab 07/08/19 1155 07/09/19 0205  AST 92* 67*  ALT 85* 75*  ALKPHOS 84 78  BILITOT 0.9 0.4  PROT 7.0 6.6  ALBUMIN 2.9* 2.6*   No results for input(s): LIPASE, AMYLASE in the last 168 hours. No results for input(s): AMMONIA in the last 168 hours. Coagulation profile No results for input(s): INR, PROTIME in the last 168 hours. COVID-19 Labs  Recent Labs    07/08/19 1122 07/08/19 1155 07/09/19 0205  DDIMER  --  5.15* 5.88*  FERRITIN 1,293*  --  1,115*  LDH  --  594*  --   CRP 23.5*  --  22.2*    Lab Results  Component Value Date   SARSCOV2NAA POSITIVE (A) 07/08/2019    CBC: Recent Labs  Lab 07/08/19 1155 07/09/19 0205  WBC 8.5 4.4  NEUTROABS 7.6 3.8  HGB 10.5* 9.8*  HCT 29.9* 28.0*  MCV 73.3* 73.7*  PLT 386 430*   Cardiac Enzymes: No results for input(s): CKTOTAL, CKMB, CKMBINDEX, TROPONINI in the last 168 hours. BNP (last 3 results) No results for input(s): PROBNP in the last 8760 hours. CBG: No results for input(s): GLUCAP in the last 168 hours. D-Dimer: Recent Labs    07/08/19 1155 07/09/19 0205  DDIMER 5.15* 5.88*   Hgb A1c: No results for input(s): HGBA1C in the last 72 hours. Lipid Profile: Recent Labs    07/08/19 1156 07/09/19 0205  TRIG 170* 116   Thyroid function studies: No results for input(s): TSH, T4TOTAL, T3FREE, THYROIDAB in the last 72 hours.  Invalid  input(s): FREET3 Anemia work up: Recent Labs    07/08/19 1122 07/09/19 0205  FERRITIN 1,293* 1,115*   Sepsis Labs: Recent Labs  Lab 07/08/19 1122 07/08/19 1155 07/09/19 0205  PROCALCITON  --  3.57  --   WBC  --  8.5 4.4  LATICACIDVEN 1.5  --   --    Microbiology Recent Results (from the past 240 hour(s))  SARS Coronavirus 2 [facility] order, Performed in [facility] hospital lab) Nasopharyngeal Nasopharyngeal Swab     Status: Abnormal   Collection Time: 07/08/19 11:22 AM   Specimen: Nasopharyngeal Swab  Result Value Ref Range Status   SARS Coronavirus 2 POSITIVE (A) NEGATIVE Final    Comment: RESULT CALLED TO, READ BACK BY AND VERIFIED  WITH: Ashby DawesROSSER, M. RN @1311  07/08/2019 BY NMCCOY  (NOTE) If result is NEGATIVE SARS-CoV-2 target nucleic acids are NOT DETECTED. The SARS-CoV-2 RNA is generally detectable in upper and lower  respiratory specimens during the acute phase of infection. The lowest  concentration of SARS-CoV-2 viral copies this assay can detect is 250  copies / mL. A negative result does not preclude SARS-CoV-2 infection  and should not be used as the sole basis for treatment or other  patient management decisions.  A negative result may occur with  improper specimen collection / handling, submission of specimen other  than nasopharyngeal swab, presence of viral mutation(s) within the  areas targeted by this assay, and inadequate number of viral copies  (<250 copies / mL). A negative result must be combined with clinical  observations, patient history, and epidemiological information. If result is POSITIVE SARS-CoV-2 target nucleic acids are DETECT ED. The SARS-CoV-2 RNA is generally detectable in upper and lower  respiratory specimens during the acute phase of infection.  Positive  results are indicative of active infection with SARS-CoV-2.  Clinical  correlation with patient history and other diagnostic information is  necessary to determine patient infection  status.  Positive results do  not rule out bacterial infection or co-infection with other viruses. If result is PRESUMPTIVE POSTIVE SARS-CoV-2 nucleic acids MAY BE PRESENT.   A presumptive positive result was obtained on the submitted specimen  and confirmed on repeat testing.  While 2019 novel coronavirus  (SARS-CoV-2) nucleic acids may be present in the submitted sample  additional confirmatory testing may be necessary for epidemiological  and / or clinical management purposes  to differentiate between  SARS-CoV-2 and other Sarbecovirus currently known to infect humans.  If clinically indicated additional testing with an alternate test  methodology (LAB74 53) is advised. The SARS-CoV-2 RNA is generally  detectable in upper and lower respiratory specimens during the acute  phase of infection. The expected result is Negative. Fact Sheet for Patients:  BoilerBrush.com.cyhttps://www.fda.gov/media/136312/download Fact Sheet for Healthcare Providers: https://pope.com/https://www.fda.gov/media/136313/download This test is not yet approved or cleared by the [place] FDA and has been authorized for detection and/or diagnosis of SARS-CoV-2 by FDA under an Emergency Use Authorization (EUA).  This EUA will remain in effect (meaning this test can be used) for the duration of the COVID-19 declaration under Section 564(b)(1) of the Act, 21 U.S.C. section 360bbb-3(b)(1), unless the authorization is terminated or revoked sooner. Performed at [facility], [address] [place], [place] [address]   Blood Culture (routine x 2)     Status: None (Preliminary result)   Collection Time: 07/08/19 11:40 AM   Specimen: BLOOD  Result Value Ref Range Status   Specimen Description   Final    BLOOD LEFT ANTECUBITAL Performed at [facility], [address] [place], [place] [address]    Special Requests   Final    BOTTLES DRAWN AEROBIC AND ANAEROBIC Blood Culture adequate volume Performed at  [facility], [address] [place], [place] [address]    Culture   Final    NO GROWTH < 24 HOURS Performed at [facility], [address] [place], [place] [address]    Report Status PENDING  Incomplete  Blood Culture (routine x 2)     Status: None (Preliminary result)   Collection Time: 07/08/19 11:45 AM   Specimen: BLOOD  Result Value Ref Range Status   Specimen Description   Final    BLOOD RIGHT  HAND Performed at [facility], [address] [place], [place] [address]    Special Requests   Final    BOTTLES DRAWN AEROBIC AND ANAEROBIC Blood Culture adequate volume Performed at [facility], [address] [place], [place] [address]    Culture   Final    NO GROWTH < 24 HOURS Performed at [facility], [address] [place], [place] [address]    Report Status PENDING  Incomplete     Medications:   . cholecalciferol  1,000 Units Oral Daily  . dexamethasone (DECADRON) injection  6 mg Intravenous Q1200  . heparin  7,500 Units Subcutaneous Q8H  . vitamin C  500 mg Oral Daily  . zinc sulfate  220 mg Oral Daily   Continuous Infusions: . azithromycin Stopped (07/08/19 1605)  . cefTRIAXone (ROCEPHIN)  IV Stopped (07/08/19 1450)  . lactated ringers 75 mL/hr at 07/08/19 2246  . remdesivir 100 mg in NS 250 mL        LOS: 1 day   Marinda Elkbraham Feliz Ortiz  [facility]  07/09/2019, 7:16 AM

## 2019-07-09 NOTE — Progress Notes (Addendum)
Pt. On 5L oxygen with satuation of 89% and respiration in the 30s.  Pt. Is not coughing nor expectorating any sputum as yet.  Dr. Shanon Brow page.

## 2019-07-10 ENCOUNTER — Inpatient Hospital Stay (HOSPITAL_COMMUNITY): Payer: Managed Care, Other (non HMO)

## 2019-07-10 DIAGNOSIS — R7989 Other specified abnormal findings of blood chemistry: Secondary | ICD-10-CM

## 2019-07-10 LAB — POCT I-STAT 7, (LYTES, BLD GAS, ICA,H+H)
Acid-base deficit: 3 mmol/L — ABNORMAL HIGH (ref 0.0–2.0)
Bicarbonate: 20.8 mmol/L (ref 20.0–28.0)
Calcium, Ion: 1.3 mmol/L (ref 1.15–1.40)
HCT: 29 % — ABNORMAL LOW (ref 39.0–52.0)
Hemoglobin: 9.9 g/dL — ABNORMAL LOW (ref 13.0–17.0)
O2 Saturation: 97 %
Patient temperature: 98.6
Potassium: 4.7 mmol/L (ref 3.5–5.1)
Sodium: 140 mmol/L (ref 135–145)
TCO2: 22 mmol/L (ref 22–32)
pCO2 arterial: 33.1 mmHg (ref 32.0–48.0)
pH, Arterial: 7.407 (ref 7.350–7.450)
pO2, Arterial: 93 mmHg (ref 83.0–108.0)

## 2019-07-10 LAB — CBC WITH DIFFERENTIAL/PLATELET
Abs Immature Granulocytes: 0.17 10*3/uL — ABNORMAL HIGH (ref 0.00–0.07)
Basophils Absolute: 0 10*3/uL (ref 0.0–0.1)
Basophils Relative: 0 %
Eosinophils Absolute: 0 10*3/uL (ref 0.0–0.5)
Eosinophils Relative: 0 %
HCT: 32.3 % — ABNORMAL LOW (ref 39.0–52.0)
Hemoglobin: 11 g/dL — ABNORMAL LOW (ref 13.0–17.0)
Immature Granulocytes: 1 %
Lymphocytes Relative: 5 %
Lymphs Abs: 0.6 10*3/uL — ABNORMAL LOW (ref 0.7–4.0)
MCH: 25.8 pg — ABNORMAL LOW (ref 26.0–34.0)
MCHC: 34.1 g/dL (ref 30.0–36.0)
MCV: 75.6 fL — ABNORMAL LOW (ref 80.0–100.0)
Monocytes Absolute: 0.5 10*3/uL (ref 0.1–1.0)
Monocytes Relative: 4 %
Neutro Abs: 11.1 10*3/uL — ABNORMAL HIGH (ref 1.7–7.7)
Neutrophils Relative %: 90 %
Platelets: 529 10*3/uL — ABNORMAL HIGH (ref 150–400)
RBC: 4.27 MIL/uL (ref 4.22–5.81)
RDW: 13.6 % (ref 11.5–15.5)
WBC: 12.4 10*3/uL — ABNORMAL HIGH (ref 4.0–10.5)
nRBC: 0.2 % (ref 0.0–0.2)

## 2019-07-10 LAB — COMPREHENSIVE METABOLIC PANEL
ALT: 74 U/L — ABNORMAL HIGH (ref 0–44)
AST: 53 U/L — ABNORMAL HIGH (ref 15–41)
Albumin: 2.7 g/dL — ABNORMAL LOW (ref 3.5–5.0)
Alkaline Phosphatase: 80 U/L (ref 38–126)
Anion gap: 14 (ref 5–15)
BUN: 51 mg/dL — ABNORMAL HIGH (ref 8–23)
CO2: 20 mmol/L — ABNORMAL LOW (ref 22–32)
Calcium: 8.8 mg/dL — ABNORMAL LOW (ref 8.9–10.3)
Chloride: 106 mmol/L (ref 98–111)
Creatinine, Ser: 1.82 mg/dL — ABNORMAL HIGH (ref 0.61–1.24)
GFR calc Af Amer: 45 mL/min — ABNORMAL LOW (ref >60)
GFR calc non Af Amer: 39 mL/min — ABNORMAL LOW (ref >60)
Glucose, Bld: 123 mg/dL — ABNORMAL HIGH (ref 70–99)
Potassium: 4.7 mmol/L (ref 3.5–5.1)
Sodium: 140 mmol/L (ref 135–145)
Total Bilirubin: 0.4 mg/dL (ref 0.3–1.2)
Total Protein: 6.8 g/dL (ref 6.5–8.1)

## 2019-07-10 LAB — LEGIONELLA PNEUMOPHILA SEROGP 1 UR AG: L. pneumophila Serogp 1 Ur Ag: NEGATIVE

## 2019-07-10 LAB — D-DIMER, QUANTITATIVE: D-Dimer, Quant: 4.11 ug/mL-FEU — ABNORMAL HIGH (ref 0.00–0.50)

## 2019-07-10 LAB — PROCALCITONIN: Procalcitonin: 2.17 ng/mL

## 2019-07-10 LAB — INTERLEUKIN-6, PLASMA: Interleukin-6, Plasma: 5.6 pg/mL (ref 0.0–12.2)

## 2019-07-10 LAB — FERRITIN: Ferritin: 1418 ng/mL — ABNORMAL HIGH (ref 24–336)

## 2019-07-10 LAB — MAGNESIUM: Magnesium: 2.6 mg/dL — ABNORMAL HIGH (ref 1.7–2.4)

## 2019-07-10 LAB — TRIGLYCERIDES: Triglycerides: 98 mg/dL (ref <150)

## 2019-07-10 LAB — C-REACTIVE PROTEIN: CRP: 12.8 mg/dL — ABNORMAL HIGH (ref <1.0)

## 2019-07-10 MED ORDER — METHYLPREDNISOLONE SODIUM SUCC 40 MG IJ SOLR
40.0000 mg | Freq: Two times a day (BID) | INTRAMUSCULAR | Status: DC
  Administered 2019-07-10 – 2019-07-13 (×8): 40 mg via INTRAVENOUS
  Filled 2019-07-10 (×9): qty 1

## 2019-07-10 NOTE — Plan of Care (Signed)
  Problem: Clinical Measurements: Goal: Ability to maintain clinical measurements within normal limits will improve Outcome: Progressing   Problem: Clinical Measurements: Goal: Diagnostic test results will improve Outcome: Progressing   Problem: Clinical Measurements: Goal: Respiratory complications will improve Outcome: Progressing   Problem: Nutrition: Goal: Adequate nutrition will be maintained Outcome: Progressing   Problem: Respiratory: Goal: Will maintain a patent airway Outcome: Progressing

## 2019-07-10 NOTE — Progress Notes (Addendum)
PROGRESS NOTE                                                                                                                                                                                                             Patient Demographics:    Eric [name], is a 63 y.o. male, DOB - [birth year], UJW:[id number]  Outpatient Primary MD for the patient is Elias Elseeade, Robert, MD   Admit date - 07/08/2019   LOS - 2  Chief Complaint  Patient presents with  . Shortness of Breath       Brief Narrative: Patient is a 63 y.o. male with PMHx of HTN, bicuspid aortic valve-presented with 4-day history of shortness of breath-found to have acute hypoxemic respiratory failure in the setting of COVID-19 pneumonia.  See below for further details   Subjective:    Eric SilversJerome Laury has developed worsening respiratory failure overnight-on 15 L of high flow oxygen.  Although slightly anxious-he is easily speaking in full sentences this morning.   Assessment  & Plan :   Acute Hypoxic Resp Failure due to Covid 19 Viral pneumonia with superimposed bacterial pneumonia: Worsening hypoxemia-continue steroids, Remdesivir, empiric Rocephin and Zithromax as procalcitonin remains significantly elevated.  He appears stable without any acute respiratory distress-speaking in full sentences.  If hypoxemia worsens or he develops respiratory insufficiency-we will need to be transferred to the ICU.  Given significantly elevated procalcitonin-hold off on Actemra at this point  Fever: febrile  O2 requirements: On 15 l/m (was on 5L yesterday)  COVID-19 Labs: Recent Labs    07/08/19 1122 07/08/19 1155 07/09/19 0205 07/10/19 0215  DDIMER  --  5.15* 5.88* 4.11*  FERRITIN 1,293*  --  1,115* 1,418*  LDH  --  594*  --   --   CRP 23.5*  --  22.2* 12.8*    COVID-19 Medications: Steroids: Decadron 8/17 >> 8/18, Solu-Medrol 8/19>> Remdesivir: 8/17>> Actemra: Not given  Convalescent Plasma:N/A Research Studies:N/A  Other medications: Diuretics: Euvolemic-no indication for diuretics Antibiotics: Rocephin/Zithromax 8/17>>  Prone/Incentive Spirometry: encouraged patient to lie prone for 3-4 hours at a time for a total of 16 hours a day, and to encourage incentive spirometry use 3-4/hour.  DVT Prophylaxis  : Heparin  AKI on CKD stage III: Likely hemodynamically mediated-slowly improving-follow electrolytes  HTN: Blood pressure controlled without the use any antihypertensives-follow  ABG:  Component Value Date/Time   PHART 7.407 07/10/2019 1250   PCO2ART 33.1 07/10/2019 1250   PO2ART 93.0 07/10/2019 1250   HCO3 20.8 07/10/2019 1250   TCO2 22 07/10/2019 1250   ACIDBASEDEF 3.0 (H) 07/10/2019 1250   O2SAT 97.0 07/10/2019 1250    Vent Settings: N/A  Condition - Extremely Guarded-very tenuous with risk for further deterioration  Family Communication  :  Spouse updated over the phone  Code Status :  Full Code  Diet :  Diet Order            Diet Heart Room service appropriate? Yes; Fluid consistency: Thin  Diet effective now               Disposition Plan  :  Remain hospitalized-not stable for discharge-require several more days of hospitalization.  Consults  :  None  Procedures  :  None  Antibiotics  :    Anti-infectives (From admission, onward)   Start     Dose/Rate Route Frequency Ordered Stop   07/09/19 1500  remdesivir 100 mg in sodium chloride 0.9 % 250 mL IVPB     100 mg 500 mL/hr over 30 Minutes Intravenous Every 24 hours 07/08/19 1348 07/13/19 1459   07/08/19 1500  azithromycin (ZITHROMAX) 500 mg in sodium chloride 0.9 % 250 mL IVPB     500 mg 250 mL/hr over 60 Minutes Intravenous Every 24 hours 07/08/19 1328 07/13/19 1459   07/08/19 1500  remdesivir 200 mg in sodium chloride 0.9 % 250 mL IVPB     200 mg 500 mL/hr over 30 Minutes Intravenous Once 07/08/19 1348 07/08/19 1704   07/08/19 1400  cefTRIAXone (ROCEPHIN) 2 g  in sodium chloride 0.9 % 100 mL IVPB     2 g 200 mL/hr over 30 Minutes Intravenous Every 24 hours 07/08/19 1328 07/13/19 1359      Inpatient Medications  Scheduled Meds: . cholecalciferol  1,000 Units Oral Daily  . heparin  7,500 Units Subcutaneous Q8H  . methylPREDNISolone (SOLU-MEDROL) injection  40 mg Intravenous Q12H  . vitamin C  500 mg Oral Daily  . zinc sulfate  220 mg Oral Daily   Continuous Infusions: . azithromycin Stopped (07/09/19 1558)  . cefTRIAXone (ROCEPHIN)  IV 2 g (07/10/19 1315)  . remdesivir 100 mg in NS 250 mL Stopped (07/09/19 1637)   PRN Meds:.acetaminophen **OR** acetaminophen, chlorpheniramine-HYDROcodone, guaiFENesin-dextromethorphan  Time Spent in minutes  35   See all Orders from today for further details  Oren Binet M.D on 07/10/2019 at 2:40 PM  To page go to [email or web address] - use universal password  [facility] -  Office  [phone]    Objective:   Vitals:   07/09/19 2023 07/10/19 0213 07/10/19 0239 07/10/19 0732  BP: 135/62   (!) 113/46  Pulse:    71  Resp:    18  Temp: 97.7 F (36.5 C)  97.9 F (36.6 C) 98.1 F (36.7 C)  TempSrc: Oral  Oral Oral  SpO2:  (!) 87% 90% 91%  Weight:      Height:        Wt Readings from Last 3 Encounters:  07/08/19 94.6 kg  10/08/18 94.5 kg  08/31/17 97.3 kg     Intake/Output Summary (Last 24 hours) at 07/10/2019 1440 Last data filed at 07/10/2019 0900 Gross per 24 hour  Intake 2210.85 ml  Output 550 ml  Net 1660.85 ml     Physical Exam Gen Exam:Alert awake-not in any distress HEENT:atraumatic, normocephalic Chest: B/L Rales  CVS:S1S2 regular Abdomen:soft non tender, non distended Extremities:no edema Neurology: Non focal Skin: no rash   Data Review:    CBC Recent Labs  Lab 07/08/19 1155 07/09/19 0205 07/10/19 0215 07/10/19 1250  WBC 8.5 4.4 12.4*  --   HGB 10.5* 9.8* 11.0* 9.9*  HCT 29.9* 28.0* 32.3* 29.0*  PLT 386 430* 529*  --   MCV 73.3* 73.7* 75.6*  --    MCH 25.7* 25.8* 25.8*  --   MCHC 35.1 35.0 34.1  --   RDW 13.2 13.4 13.6  --   LYMPHSABS 0.4* 0.3* 0.6*  --   MONOABS 0.4 0.2 0.5  --   EOSABS 0.0 0.0 0.0  --   BASOSABS 0.0 0.0 0.0  --     Chemistries  Recent Labs  Lab 07/08/19 1155 07/09/19 0205 07/10/19 0215 07/10/19 1250  NA 134* 135 140 140  K 4.1 4.9 4.7 4.7  CL 98 101 106  --   CO2 21* 22 20*  --   GLUCOSE 126* 168* 123*  --   BUN 42* 44* 51*  --   CREATININE 2.17* 1.87* 1.82*  --   CALCIUM 8.4* 8.3* 8.8*  --   MG  --  2.6* 2.6*  --   AST 92* 67* 53*  --   ALT 85* 75* 74*  --   ALKPHOS 84 78 80  --   BILITOT 0.9 0.4 0.4  --    ------------------------------------------------------------------------------------------------------------------ Recent Labs    07/09/19 0205 07/10/19 0215  TRIG 116 98    No results found for: HGBA1C ------------------------------------------------------------------------------------------------------------------ No results for input(s): TSH, T4TOTAL, T3FREE, THYROIDAB in the last 72 hours.  Invalid input(s): FREET3 ------------------------------------------------------------------------------------------------------------------ Recent Labs    07/09/19 0205 07/10/19 0215  FERRITIN 1,115* 1,418*    Coagulation profile No results for input(s): INR, PROTIME in the last 168 hours.  Recent Labs    07/09/19 0205 07/10/19 0215  DDIMER 5.88* 4.11*    Cardiac Enzymes No results for input(s): CKMB, TROPONINI, MYOGLOBIN in the last 168 hours.  Invalid input(s): CK ------------------------------------------------------------------------------------------------------------------    Component Value Date/Time   BNP 69.1 07/08/2019 1123    Micro Results Recent Results (from the past 240 hour(s))  SARS Coronavirus 2 [facility] order, Performed in [facility] hospital lab) Nasopharyngeal Nasopharyngeal Swab     Status: Abnormal   Collection Time: 07/08/19 11:22 AM   Specimen:  Nasopharyngeal Swab  Result Value Ref Range Status   SARS Coronavirus 2 POSITIVE (A) NEGATIVE Final    Comment: RESULT CALLED TO, READ BACK BY AND VERIFIED WITH: Ashby Dawes. RN @1311  07/08/2019 BY NMCCOY  (NOTE) If result is NEGATIVE SARS-CoV-2 target nucleic acids are NOT DETECTED. The SARS-CoV-2 RNA is generally detectable in upper and lower  respiratory specimens during the acute phase of infection. The lowest  concentration of SARS-CoV-2 viral copies this assay can detect is 250  copies / mL. A negative result does not preclude SARS-CoV-2 infection  and should not be used as the sole basis for treatment or other  patient management decisions.  A negative result may occur with  improper specimen collection / handling, submission of specimen other  than nasopharyngeal swab, presence of viral mutation(s) within the  areas targeted by this assay, and inadequate number of viral copies  (<250 copies / mL). A negative result must be combined with clinical  observations, patient history, and epidemiological information. If result is POSITIVE SARS-CoV-2 target nucleic acids are DETECT ED. The SARS-CoV-2 RNA is generally detectable  in upper and lower  respiratory specimens during the acute phase of infection.  Positive  results are indicative of active infection with SARS-CoV-2.  Clinical  correlation with patient history and other diagnostic information is  necessary to determine patient infection status.  Positive results do  not rule out bacterial infection or co-infection with other viruses. If result is PRESUMPTIVE POSTIVE SARS-CoV-2 nucleic acids MAY BE PRESENT.   A presumptive positive result was obtained on the submitted specimen  and confirmed on repeat testing.  While 2019 novel coronavirus  (SARS-CoV-2) nucleic acids may be present in the submitted sample  additional confirmatory testing may be necessary for epidemiological  and / or clinical management purposes  to  differentiate between  SARS-CoV-2 and other Sarbecovirus currently known to infect humans.  If clinically indicated additional testing with an alternate test  methodology (LAB74 53) is advised. The SARS-CoV-2 RNA is generally  detectable in upper and lower respiratory specimens during the acute  phase of infection. The expected result is Negative. Fact Sheet for Patients:  BoilerBrush.com.cyhttps://www.fda.gov/media/136312/download Fact Sheet for Healthcare Providers: https://pope.com/https://www.fda.gov/media/136313/download This test is not yet approved or cleared by the [place] FDA and has been authorized for detection and/or diagnosis of SARS-CoV-2 by FDA under an Emergency Use Authorization (EUA).  This EUA will remain in effect (meaning this test can be used) for the duration of the COVID-19 declaration under Section 564(b)(1) of the Act, 21 U.S.C. section 360bbb-3(b)(1), unless the authorization is terminated or revoked sooner. Performed at [facility], [address] [place], [place] [address]   Blood Culture (routine x 2)     Status: None (Preliminary result)   Collection Time: 07/08/19 11:40 AM   Specimen: BLOOD  Result Value Ref Range Status   Specimen Description   Final    BLOOD LEFT ANTECUBITAL Performed at [facility], [address] [place], [place] [address]    Special Requests   Final    BOTTLES DRAWN AEROBIC AND ANAEROBIC Blood Culture adequate volume Performed at [facility], [address] [place], [place] [address]    Culture   Final    NO GROWTH 2 DAYS Performed at [facility], [address] [place], [place] [address]    Report Status PENDING  Incomplete  Blood Culture (routine x 2)     Status: None (Preliminary result)   Collection Time: 07/08/19 11:45 AM   Specimen: BLOOD  Result Value Ref Range Status   Specimen Description   Final    BLOOD RIGHT HAND Performed at [facility], [address] [place], [place] [address]    Special Requests   Final    BOTTLES DRAWN AEROBIC AND ANAEROBIC Blood Culture adequate volume Performed at [facility], [address] [place], [place] [address]    Culture   Final    NO GROWTH 2 DAYS Performed at [facility], [address] [place], [place] [address]    Report Status PENDING  Incomplete  Culture, sputum-assessment     Status: None   Collection Time: 07/09/19  7:50 AM   Specimen: Sputum  Result Value Ref Range Status   Specimen Description SPUTUM  Final   Special Requests NONE  Final   Sputum evaluation   Final    THIS SPECIMEN IS ACCEPTABLE FOR SPUTUM CULTURE Performed at [facility], [address] [place], [place] [address]    Report Status 07/09/2019 FINAL  Final  Culture, respiratory     Status: None (Preliminary result)   Collection Time: 07/09/19  7:50 AM   Specimen: SPU  Result Value Ref Range Status   Specimen Description   Final    SPUTUM Performed at [facility], [address] [place], [place] [address]    Special Requests   Final    NONE Reflexed from [id number] Performed at [facility], [address] [place], [place] [address]    Gram Stain NO WBC SEEN FEW GRAM VARIABLE ROD   Final   Culture   Final    CULTURE REINCUBATED FOR BETTER GROWTH Performed at [facility], [address] [place], [place] [address]    Report Status PENDING  Incomplete    Radiology Reports Dg Chest Port 1 View  Result Date: 07/10/2019 CLINICAL DATA:  Respiratory distress, history of COVID-19 positivity EXAM: PORTABLE CHEST 1 VIEW COMPARISON:  07/08/2019 FINDINGS: Cardiac shadow is stable. Lungs are well aerated bilaterally. Persistent left mid and lower lung infiltrate is seen although slightly improved when compared with the prior exam. Patchy changes are noted in the right lung as well. No bony abnormality is seen. IMPRESSION:  Bilateral infiltrates although somewhat improved when compared with the prior exam. Electronically Signed   By: Alcide CleverMark  Lukens M.D.   On: 07/10/2019 09:35   Dg Chest Port 1 View  Result Date: 07/08/2019 CLINICAL DATA:  Cough, shortness of breath. EXAM: PORTABLE CHEST 1 VIEW COMPARISON:  Radiographs of August 24, 2015. FINDINGS: Stable cardiomediastinal silhouette. No pneumothorax is noted. Mild bilateral perihilar and basilar opacities are noted concerning for atelectasis or infiltrates. Small left pleural effusion may be present. Bony thorax is unremarkable. IMPRESSION: Mild bilateral perihilar and basilar opacities are noted concerning for atelectasis or possibly multifocal pneumonia. Small left pleural effusion is noted. Electronically Signed   By: Lupita RaiderJames  Green Jr M.D.   On: 07/08/2019 11:59

## 2019-07-10 NOTE — Plan of Care (Addendum)
Patient got himself up to the chair at bedside in attempt to brush teeth and wash up. Upon exercertion 02 saturation levels dropped to high 70's-mid 80's. Patient placed on non-rebreather at 15L and MD notified. Chest x-ray and ABG's ordered as per verbal order from MD. Patient sats returned to normal limits on high flow. Patient educated about the benefits of proning. Will continue to monitor for remainder of shift.    Problem: Health Behavior/Discharge Planning: Goal: Ability to manage health-related needs will improve Outcome: Progressing   Problem: Clinical Measurements: Goal: Ability to maintain clinical measurements within normal limits will improve Outcome: Progressing Goal: Will remain free from infection Outcome: Progressing Goal: Diagnostic test results will improve Outcome: Progressing Goal: Respiratory complications will improve Outcome: Progressing Goal: Cardiovascular complication will be avoided Outcome: Progressing   Problem: Activity: Goal: Risk for activity intolerance will decrease Outcome: Progressing   Problem: Nutrition: Goal: Adequate nutrition will be maintained Outcome: Progressing   Problem: Coping: Goal: Level of anxiety will decrease Outcome: Progressing   Problem: Elimination: Goal: Will not experience complications related to bowel motility Outcome: Progressing Goal: Will not experience complications related to urinary retention Outcome: Progressing   Problem: Pain Managment: Goal: General experience of comfort will improve Outcome: Progressing   Problem: Safety: Goal: Ability to remain free from injury will improve Outcome: Progressing   Problem: Skin Integrity: Goal: Risk for impaired skin integrity will decrease Outcome: Progressing   Problem: Education: Goal: Knowledge of risk factors and measures for prevention of condition will improve Outcome: Progressing   Problem: Coping: Goal: Psychosocial and spiritual needs will be  supported Outcome: Progressing

## 2019-07-10 NOTE — Progress Notes (Signed)
Bilateral lower extremity venous duplex has been completed. Preliminary results can be found in CV Proc through chart review.   07/10/19 2:59 PM Carlos Levering RVT

## 2019-07-11 LAB — CBC WITH DIFFERENTIAL/PLATELET
Abs Immature Granulocytes: 0.12 10*3/uL — ABNORMAL HIGH (ref 0.00–0.07)
Basophils Absolute: 0 10*3/uL (ref 0.0–0.1)
Basophils Relative: 0 %
Eosinophils Absolute: 0 10*3/uL (ref 0.0–0.5)
Eosinophils Relative: 0 %
HCT: 31.5 % — ABNORMAL LOW (ref 39.0–52.0)
Hemoglobin: 10.7 g/dL — ABNORMAL LOW (ref 13.0–17.0)
Immature Granulocytes: 1 %
Lymphocytes Relative: 4 %
Lymphs Abs: 0.5 10*3/uL — ABNORMAL LOW (ref 0.7–4.0)
MCH: 25.4 pg — ABNORMAL LOW (ref 26.0–34.0)
MCHC: 34 g/dL (ref 30.0–36.0)
MCV: 74.8 fL — ABNORMAL LOW (ref 80.0–100.0)
Monocytes Absolute: 0.5 10*3/uL (ref 0.1–1.0)
Monocytes Relative: 4 %
Neutro Abs: 11.7 10*3/uL — ABNORMAL HIGH (ref 1.7–7.7)
Neutrophils Relative %: 91 %
Platelets: 508 10*3/uL — ABNORMAL HIGH (ref 150–400)
RBC: 4.21 MIL/uL — ABNORMAL LOW (ref 4.22–5.81)
RDW: 13.7 % (ref 11.5–15.5)
WBC: 12.7 10*3/uL — ABNORMAL HIGH (ref 4.0–10.5)
nRBC: 0 % (ref 0.0–0.2)

## 2019-07-11 LAB — C-REACTIVE PROTEIN: CRP: 6.6 mg/dL — ABNORMAL HIGH (ref <1.0)

## 2019-07-11 LAB — COMPREHENSIVE METABOLIC PANEL
ALT: 59 U/L — ABNORMAL HIGH (ref 0–44)
AST: 33 U/L (ref 15–41)
Albumin: 2.7 g/dL — ABNORMAL LOW (ref 3.5–5.0)
Alkaline Phosphatase: 69 U/L (ref 38–126)
Anion gap: 10 (ref 5–15)
BUN: 55 mg/dL — ABNORMAL HIGH (ref 8–23)
CO2: 21 mmol/L — ABNORMAL LOW (ref 22–32)
Calcium: 8.6 mg/dL — ABNORMAL LOW (ref 8.9–10.3)
Chloride: 110 mmol/L (ref 98–111)
Creatinine, Ser: 1.68 mg/dL — ABNORMAL HIGH (ref 0.61–1.24)
GFR calc Af Amer: 50 mL/min — ABNORMAL LOW (ref >60)
GFR calc non Af Amer: 43 mL/min — ABNORMAL LOW (ref >60)
Glucose, Bld: 113 mg/dL — ABNORMAL HIGH (ref 70–99)
Potassium: 5.3 mmol/L — ABNORMAL HIGH (ref 3.5–5.1)
Sodium: 141 mmol/L (ref 135–145)
Total Bilirubin: 0.6 mg/dL (ref 0.3–1.2)
Total Protein: 6.5 g/dL (ref 6.5–8.1)

## 2019-07-11 LAB — FERRITIN: Ferritin: 1059 ng/mL — ABNORMAL HIGH (ref 24–336)

## 2019-07-11 LAB — D-DIMER, QUANTITATIVE: D-Dimer, Quant: 3.02 ug/mL-FEU — ABNORMAL HIGH (ref 0.00–0.50)

## 2019-07-11 MED ORDER — SODIUM ZIRCONIUM CYCLOSILICATE 10 G PO PACK
10.0000 g | PACK | Freq: Every day | ORAL | Status: AC
  Administered 2019-07-11: 10 g via ORAL
  Filled 2019-07-11: qty 1

## 2019-07-11 NOTE — Plan of Care (Signed)
Spoke to wife and updated on patient status and current POC.

## 2019-07-11 NOTE — Plan of Care (Signed)
  Problem: Health Behavior/Discharge Planning: Goal: Ability to manage health-related needs will improve Outcome: Progressing   Problem: Clinical Measurements: Goal: Ability to maintain clinical measurements within normal limits will improve Outcome: Progressing Goal: Will remain free from infection Outcome: Progressing Goal: Respiratory complications will improve Outcome: Progressing   Problem: Activity: Goal: Risk for activity intolerance will decrease Outcome: Progressing   Problem: Coping: Goal: Level of anxiety will decrease Outcome: Progressing   Problem: Elimination: Goal: Will not experience complications related to bowel motility Outcome: Progressing   Problem: Pain Managment: Goal: General experience of comfort will improve Outcome: Progressing   Problem: Safety: Goal: Ability to remain free from injury will improve Outcome: Progressing   Problem: Skin Integrity: Goal: Risk for impaired skin integrity will decrease Outcome: Progressing   Problem: Clinical Measurements: Goal: Cardiovascular complication will be avoided Outcome: Completed/Met   Problem: Nutrition: Goal: Adequate nutrition will be maintained Outcome: Completed/Met   Problem: Elimination: Goal: Will not experience complications related to urinary retention Outcome: Completed/Met

## 2019-07-11 NOTE — Progress Notes (Addendum)
PROGRESS NOTE                                                                                                                                                                                                             Patient Demographics:    Charlies SilversJerome Rathgeber, is a 63 y.o. male, DOB - 1956-03-02, UEA:[id number]  Outpatient Primary MD for the patient is Elias Elseeade, Robert, MD   Admit date - 07/08/2019   LOS - 3  Chief Complaint  Patient presents with   Shortness of Breath       Brief Narrative: Patient is a 63 y.o. male with PMHx of HTN, bicuspid aortic valve-presented with 4-day history of shortness of breath-found to have acute hypoxemic respiratory failure in the setting of COVID-19 pneumonia.  See below for further details   Subjective:    Charlies SilversJerome Rogowski did not have any acute issues overnight-titrated down to 5 L of oxygen.  Appears comfortable.   Assessment  & Plan :   Acute Hypoxic Resp Failure due to Covid 19 Viral pneumonia with superimposed bacterial pneumonia: Improved compared to yesterday with significantly less O2 requirements-now on 5 L of oxygen.  Continue steroids, Remdesivir and Rocephin/Zithromax.  Given elevated procalcitonin and concern for bacterial superinfection-Actemra not given.  Fever: afebrile  O2 requirements: On 5/m (was on 15L yesterday)  COVID-19 Labs: Recent Labs    07/08/19 1155 07/09/19 0205 07/10/19 0215 07/11/19 0410  DDIMER 5.15* 5.88* 4.11* 3.02*  FERRITIN  --  1,115* 1,418* 1,059*  LDH 594*  --   --   --   CRP  --  22.2* 12.8* 6.6*    COVID-19 Medications: Steroids: Decadron 8/17 >> 8/18, Solu-Medrol 8/19>> Remdesivir: 8/17>> Actemra: Not given Convalescent Plasma:N/A Research Studies:N/A  Other medications: Diuretics: Euvolemic-no indication for diuretics Antibiotics: Rocephin/Zithromax 8/17>>  Prone/Incentive Spirometry: encouraged patient to lie prone for 3-4 hours at a  time for a total of 16 hours a day, and to encourage incentive spirometry use 3-4/hour.  DVT Prophylaxis  : Heparin  AKI on CKD stage III: Likely hemodynamically mediated-slowly improving-follow electrolytes  HTN: Blood pressure controlled without the use any antihypertensives-follow  ABG:    Component Value Date/Time   PHART 7.407 07/10/2019 1250   PCO2ART 33.1 07/10/2019 1250   PO2ART 93.0 07/10/2019 1250   HCO3 20.8 07/10/2019 1250   TCO2  22 07/10/2019 1250   ACIDBASEDEF 3.0 (H) 07/10/2019 1250   O2SAT 97.0 07/10/2019 1250    Vent Settings: N/A  Condition -stable  Family Communication  :  Spouse updated over the phone on 8/20  Code Status :  Full Code  Diet :  Diet Order            Diet Heart Room service appropriate? Yes; Fluid consistency: Thin  Diet effective now               Disposition Plan  :  Remain hospitalized-not stable for discharge-require several more days of hospitalization.  Consults  :  None  Procedures  :  None  Antibiotics  :    Anti-infectives (From admission, onward)   Start     Dose/Rate Route Frequency Ordered Stop   07/09/19 1500  remdesivir 100 mg in sodium chloride 0.9 % 250 mL IVPB     100 mg 500 mL/hr over 30 Minutes Intravenous Every 24 hours 07/08/19 1348 07/13/19 1459   07/08/19 1500  azithromycin (ZITHROMAX) 500 mg in sodium chloride 0.9 % 250 mL IVPB     500 mg 250 mL/hr over 60 Minutes Intravenous Every 24 hours 07/08/19 1328 07/13/19 1459   07/08/19 1500  remdesivir 200 mg in sodium chloride 0.9 % 250 mL IVPB     200 mg 500 mL/hr over 30 Minutes Intravenous Once 07/08/19 1348 07/08/19 1704   07/08/19 1400  cefTRIAXone (ROCEPHIN) 2 g in sodium chloride 0.9 % 100 mL IVPB     2 g 200 mL/hr over 30 Minutes Intravenous Every 24 hours 07/08/19 1328 07/13/19 1359      Inpatient Medications  Scheduled Meds:  cholecalciferol  1,000 Units Oral Daily   heparin  7,500 Units Subcutaneous Q8H   methylPREDNISolone  (SOLU-MEDROL) injection  40 mg Intravenous Q12H   vitamin C  500 mg Oral Daily   zinc sulfate  220 mg Oral Daily   Continuous Infusions:  azithromycin 500 mg (07/10/19 1449)   cefTRIAXone (ROCEPHIN)  IV 2 g (07/10/19 1315)   remdesivir 100 mg in NS 250 mL 100 mg (07/10/19 1742)   PRN Meds:.acetaminophen **OR** acetaminophen, chlorpheniramine-HYDROcodone, guaiFENesin-dextromethorphan  Time Spent in minutes  25   See all Orders from today for further details  Jeoffrey MassedShanker Sherina Stammer M.D on 07/11/2019 at 11:31 AM  To page go to [email or web address] - use universal password  [facility] -  Office  [phone]    Objective:   Vitals:   07/10/19 0732 07/10/19 1652 07/10/19 2027 07/11/19 0430  BP: (!) 113/46 (!) 119/52 (!) 137/56 (!) 138/58  Pulse: 71 73    Resp: 18 16    Temp: 98.1 F (36.7 C) (!) 96.5 F (35.8 C) 98.2 F (36.8 C) 98 F (36.7 C)  TempSrc: Oral Axillary Oral Oral  SpO2: 91% 96%    Weight:      Height:        Wt Readings from Last 3 Encounters:  07/08/19 94.6 kg  10/08/18 94.5 kg  08/31/17 97.3 kg     Intake/Output Summary (Last 24 hours) at 07/11/2019 1131 Last data filed at 07/10/2019 1935 Gross per 24 hour  Intake 600 ml  Output 225 ml  Net 375 ml     Physical Exam Gen Exam:Alert awake-not in any distress HEENT:atraumatic, normocephalic Chest: B/L clear to auscultation anteriorly-however has bibasilar rales mostly-posteriorly CVS:S1S2 regular Abdomen:soft non tender, non distended Extremities:no edema Neurology: Non focal Skin: no rash   Data Review:  CBC Recent Labs  Lab 07/08/19 1155 07/09/19 0205 07/10/19 0215 07/10/19 1250 07/11/19 0410  WBC 8.5 4.4 12.4*  --  12.7*  HGB 10.5* 9.8* 11.0* 9.9* 10.7*  HCT 29.9* 28.0* 32.3* 29.0* 31.5*  PLT 386 430* 529*  --  508*  MCV 73.3* 73.7* 75.6*  --  74.8*  MCH 25.7* 25.8* 25.8*  --  25.4*  MCHC 35.1 35.0 34.1  --  34.0  RDW 13.2 13.4 13.6  --  13.7  LYMPHSABS 0.4* 0.3* 0.6*  --   0.5*  MONOABS 0.4 0.2 0.5  --  0.5  EOSABS 0.0 0.0 0.0  --  0.0  BASOSABS 0.0 0.0 0.0  --  0.0    Chemistries  Recent Labs  Lab 07/08/19 1155 07/09/19 0205 07/10/19 0215 07/10/19 1250 07/11/19 0410  NA 134* 135 140 140 141  K 4.1 4.9 4.7 4.7 5.3*  CL 98 101 106  --  110  CO2 21* 22 20*  --  21*  GLUCOSE 126* 168* 123*  --  113*  BUN 42* 44* 51*  --  55*  CREATININE 2.17* 1.87* 1.82*  --  1.68*  CALCIUM 8.4* 8.3* 8.8*  --  8.6*  MG  --  2.6* 2.6*  --   --   AST 92* 67* 53*  --  33  ALT 85* 75* 74*  --  59*  ALKPHOS 84 78 80  --  69  BILITOT 0.9 0.4 0.4  --  0.6   ------------------------------------------------------------------------------------------------------------------ Recent Labs    07/09/19 0205 07/10/19 0215  TRIG 116 98    No results found for: HGBA1C ------------------------------------------------------------------------------------------------------------------ No results for input(s): TSH, T4TOTAL, T3FREE, THYROIDAB in the last 72 hours.  Invalid input(s): FREET3 ------------------------------------------------------------------------------------------------------------------ Recent Labs    07/10/19 0215 07/11/19 0410  FERRITIN 1,418* 1,059*    Coagulation profile No results for input(s): INR, PROTIME in the last 168 hours.  Recent Labs    07/10/19 0215 07/11/19 0410  DDIMER 4.11* 3.02*    Cardiac Enzymes No results for input(s): CKMB, TROPONINI, MYOGLOBIN in the last 168 hours.  Invalid input(s): CK ------------------------------------------------------------------------------------------------------------------    Component Value Date/Time   BNP 69.1 07/08/2019 1123    Micro Results Recent Results (from the past 240 hour(s))  SARS Coronavirus 2 [facility] order, Performed in [facility] hospital lab) Nasopharyngeal Nasopharyngeal Swab     Status: Abnormal   Collection Time: 07/08/19 11:22 AM   Specimen: Nasopharyngeal Swab    Result Value Ref Range Status   SARS Coronavirus 2 POSITIVE (A) NEGATIVE Final    Comment: RESULT CALLED TO, READ BACK BY AND VERIFIED WITH: Grant Fontana. RN @1311  07/08/2019 BY NMCCOY  (NOTE) If result is NEGATIVE SARS-CoV-2 target nucleic acids are NOT DETECTED. The SARS-CoV-2 RNA is generally detectable in upper and lower  respiratory specimens during the acute phase of infection. The lowest  concentration of SARS-CoV-2 viral copies this assay can detect is 250  copies / mL. A negative result does not preclude SARS-CoV-2 infection  and should not be used as the sole basis for treatment or other  patient management decisions.  A negative result may occur with  improper specimen collection / handling, submission of specimen other  than nasopharyngeal swab, presence of viral mutation(s) within the  areas targeted by this assay, and inadequate number of viral copies  (<250 copies / mL). A negative result must be combined with clinical  observations, patient history, and epidemiological information. If result is POSITIVE SARS-CoV-2 target nucleic  acids are DETECT ED. The SARS-CoV-2 RNA is generally detectable in upper and lower  respiratory specimens during the acute phase of infection.  Positive  results are indicative of active infection with SARS-CoV-2.  Clinical  correlation with patient history and other diagnostic information is  necessary to determine patient infection status.  Positive results do  not rule out bacterial infection or co-infection with other viruses. If result is PRESUMPTIVE POSTIVE SARS-CoV-2 nucleic acids MAY BE PRESENT.   A presumptive positive result was obtained on the submitted specimen  and confirmed on repeat testing.  While 2019 novel coronavirus  (SARS-CoV-2) nucleic acids may be present in the submitted sample  additional confirmatory testing may be necessary for epidemiological  and / or clinical management purposes  to differentiate between   SARS-CoV-2 and other Sarbecovirus currently known to infect humans.  If clinically indicated additional testing with an alternate test  methodology (LAB74 53) is advised. The SARS-CoV-2 RNA is generally  detectable in upper and lower respiratory specimens during the acute  phase of infection. The expected result is Negative. Fact Sheet for Patients:  BoilerBrush.com.cy Fact Sheet for Healthcare Providers: https://pope.com/ This test is not yet approved or cleared by the [place] FDA and has been authorized for detection and/or diagnosis of SARS-CoV-2 by FDA under an Emergency Use Authorization (EUA).  This EUA will remain in effect (meaning this test can be used) for the duration of the COVID-19 declaration under Section 564(b)(1) of the Act, 21 U.S.C. section 360bbb-3(b)(1), unless the authorization is terminated or revoked sooner. Performed at [facility], [address], [place], [place] [address]   Blood Culture (routine x 2)     Status: None (Preliminary result)   Collection Time: 07/08/19 11:40 AM   Specimen: BLOOD  Result Value Ref Range Status   Specimen Description   Final    BLOOD LEFT ANTECUBITAL Performed at [facility], [address], [place], [place] [address]    Special Requests   Final    BOTTLES DRAWN AEROBIC AND ANAEROBIC Blood Culture adequate volume Performed at [facility], [address], [place], [place] [address]    Culture   Final    NO GROWTH 3 DAYS Performed at [facility], [address] [place], [place] [address]    Report Status PENDING  Incomplete  Blood Culture (routine x 2)     Status: None (Preliminary result)   Collection Time: 07/08/19 11:45 AM   Specimen: BLOOD  Result Value Ref Range Status   Specimen Description   Final    BLOOD RIGHT HAND Performed at [facility], [address],  [place], [place] [address]    Special Requests   Final    BOTTLES DRAWN AEROBIC AND ANAEROBIC Blood Culture adequate volume Performed at [facility], [address], [place], [place] [address]    Culture   Final    NO GROWTH 3 DAYS Performed at [facility], [address] [place], [place] [address]    Report Status PENDING  Incomplete  Culture, sputum-assessment     Status: None   Collection Time: 07/09/19  7:50 AM   Specimen: Sputum  Result Value Ref Range Status   Specimen Description SPUTUM  Final   Special Requests NONE  Final   Sputum evaluation   Final    THIS SPECIMEN IS ACCEPTABLE FOR SPUTUM CULTURE Performed at [facility], [address] [place],  [place] [address]    Report Status 07/09/2019 FINAL  Final  Culture, respiratory     Status: None (Preliminary result)   Collection Time: 07/09/19  7:50 AM   Specimen: SPU  Result Value Ref Range Status   Specimen Description   Final    SPUTUM Performed at [facility], [address] [place], [place] [address]    Special Requests   Final    NONE Reflexed from [id number] Performed at [facility], [address] [place], [place] [address]    Gram Stain NO WBC SEEN FEW GRAM VARIABLE ROD   Final   Culture   Final    Consistent with normal respiratory flora. Performed at [facility], [address] [place], [place] [address]    Report Status PENDING  Incomplete    Radiology Reports Dg Chest Port 1 View  Result Date: 07/10/2019 CLINICAL DATA:  Respiratory distress, history of COVID-19 positivity EXAM: PORTABLE CHEST 1 VIEW COMPARISON:  07/08/2019 FINDINGS: Cardiac shadow is stable. Lungs are well aerated bilaterally. Persistent left mid and lower lung infiltrate is seen although slightly improved when compared with the prior exam. Patchy changes are noted in the right lung as well. No bony abnormality is seen. IMPRESSION: Bilateral  infiltrates although somewhat improved when compared with the prior exam. Electronically Signed   By: Alcide CleverMark  Lukens M.D.   On: 07/10/2019 09:35   Dg Chest Port 1 View  Result Date: 07/08/2019 CLINICAL DATA:  Cough, shortness of breath. EXAM: PORTABLE CHEST 1 VIEW COMPARISON:  Radiographs of August 24, 2015. FINDINGS: Stable cardiomediastinal silhouette. No pneumothorax is noted. Mild bilateral perihilar and basilar opacities are noted concerning for atelectasis or infiltrates. Small left pleural effusion may be present. Bony thorax is unremarkable. IMPRESSION: Mild bilateral perihilar and basilar opacities are noted concerning for atelectasis or possibly multifocal pneumonia. Small left pleural effusion is noted. Electronically Signed   By: Lupita RaiderJames  Green Jr M.D.   On: 07/08/2019 11:59   Vas [place]as Lower Extremity Venous (dvt)  Result Date: 07/10/2019  Lower Venous Study Indications: Elevated Ddimer.  Risk Factors: COVID 19 positive. Comparison Study: No prior studies. Performing Technologist: Chanda BusingGregory Collins RVT  Examination Guidelines: A complete evaluation includes B-mode imaging, spectral Doppler, color Doppler, and power Doppler as needed of all accessible portions of each vessel. Bilateral testing is considered an integral part of a complete examination. Limited examinations for reoccurring indications may be performed as noted.  +---------+---------------+---------+-----------+----------+--------------+  RIGHT     Compressibility Phasicity Spontaneity Properties Thrombus Aging  +---------+---------------+---------+-----------+----------+--------------+  CFV       Full            Yes       Yes                                    +---------+---------------+---------+-----------+----------+--------------+  SFJ       Full                                                             +---------+---------------+---------+-----------+----------+--------------+  FV Prox   Full                                                              +---------+---------------+---------+-----------+----------+--------------+  FV Mid    Full                                                             +---------+---------------+---------+-----------+----------+--------------+  FV Distal Full                                                             +---------+---------------+---------+-----------+----------+--------------+  PFV       Full                                                             +---------+---------------+---------+-----------+----------+--------------+  POP       Full            Yes       Yes                                    +---------+---------------+---------+-----------+----------+--------------+  PTV       Full                                                             +---------+---------------+---------+-----------+----------+--------------+  PERO      Full                                                             +---------+---------------+---------+-----------+----------+--------------+   +---------+---------------+---------+-----------+----------+--------------+  LEFT      Compressibility Phasicity Spontaneity Properties Thrombus Aging  +---------+---------------+---------+-----------+----------+--------------+  CFV       Full            Yes       Yes                                    +---------+---------------+---------+-----------+----------+--------------+  SFJ       Full                                                             +---------+---------------+---------+-----------+----------+--------------+  FV Prox   Full                                                             +---------+---------------+---------+-----------+----------+--------------+  FV Mid    Full                                                             +---------+---------------+---------+-----------+----------+--------------+  FV Distal Full                                                              +---------+---------------+---------+-----------+----------+--------------+  PFV       Full                                                             +---------+---------------+---------+-----------+----------+--------------+  POP       Full            Yes       Yes                                    +---------+---------------+---------+-----------+----------+--------------+  PTV       Full                                                             +---------+---------------+---------+-----------+----------+--------------+  PERO      Full                                                             +---------+---------------+---------+-----------+----------+--------------+     Summary: Right: There is no evidence of deep vein thrombosis in the lower extremity. No cystic structure found in the popliteal fossa. Left: There is no evidence of deep vein thrombosis in the lower extremity. No cystic structure found in the popliteal fossa.  *See table(s) above for measurements and observations. Electronically signed by Gretta Beganodd Early MD on 07/10/2019 at 4:25:14 PM.    Final

## 2019-07-12 LAB — CBC WITH DIFFERENTIAL/PLATELET
Abs Immature Granulocytes: 0.31 10*3/uL — ABNORMAL HIGH (ref 0.00–0.07)
Basophils Absolute: 0 10*3/uL (ref 0.0–0.1)
Basophils Relative: 0 %
Eosinophils Absolute: 0 10*3/uL (ref 0.0–0.5)
Eosinophils Relative: 0 %
HCT: 34.7 % — ABNORMAL LOW (ref 39.0–52.0)
Hemoglobin: 11.8 g/dL — ABNORMAL LOW (ref 13.0–17.0)
Immature Granulocytes: 2 %
Lymphocytes Relative: 5 %
Lymphs Abs: 0.8 10*3/uL (ref 0.7–4.0)
MCH: 25.9 pg — ABNORMAL LOW (ref 26.0–34.0)
MCHC: 34 g/dL (ref 30.0–36.0)
MCV: 76.1 fL — ABNORMAL LOW (ref 80.0–100.0)
Monocytes Absolute: 0.6 10*3/uL (ref 0.1–1.0)
Monocytes Relative: 4 %
Neutro Abs: 14.5 10*3/uL — ABNORMAL HIGH (ref 1.7–7.7)
Neutrophils Relative %: 89 %
Platelets: 707 10*3/uL — ABNORMAL HIGH (ref 150–400)
RBC: 4.56 MIL/uL (ref 4.22–5.81)
RDW: 13.9 % (ref 11.5–15.5)
WBC: 16.1 10*3/uL — ABNORMAL HIGH (ref 4.0–10.5)
nRBC: 0 % (ref 0.0–0.2)

## 2019-07-12 LAB — COMPREHENSIVE METABOLIC PANEL
ALT: 68 U/L — ABNORMAL HIGH (ref 0–44)
AST: 40 U/L (ref 15–41)
Albumin: 2.9 g/dL — ABNORMAL LOW (ref 3.5–5.0)
Alkaline Phosphatase: 76 U/L (ref 38–126)
Anion gap: 11 (ref 5–15)
BUN: 48 mg/dL — ABNORMAL HIGH (ref 8–23)
CO2: 22 mmol/L (ref 22–32)
Calcium: 9.1 mg/dL (ref 8.9–10.3)
Chloride: 109 mmol/L (ref 98–111)
Creatinine, Ser: 1.71 mg/dL — ABNORMAL HIGH (ref 0.61–1.24)
GFR calc Af Amer: 49 mL/min — ABNORMAL LOW (ref >60)
GFR calc non Af Amer: 42 mL/min — ABNORMAL LOW (ref >60)
Glucose, Bld: 113 mg/dL — ABNORMAL HIGH (ref 70–99)
Potassium: 4.5 mmol/L (ref 3.5–5.1)
Sodium: 142 mmol/L (ref 135–145)
Total Bilirubin: 0.3 mg/dL (ref 0.3–1.2)
Total Protein: 6.9 g/dL (ref 6.5–8.1)

## 2019-07-12 LAB — CULTURE, RESPIRATORY W GRAM STAIN
Culture: NORMAL
Gram Stain: NONE SEEN

## 2019-07-12 LAB — PROCALCITONIN: Procalcitonin: 0.49 ng/mL

## 2019-07-12 LAB — D-DIMER, QUANTITATIVE: D-Dimer, Quant: 3.26 ug/mL-FEU — ABNORMAL HIGH (ref 0.00–0.50)

## 2019-07-12 LAB — FERRITIN: Ferritin: 874 ng/mL — ABNORMAL HIGH (ref 24–336)

## 2019-07-12 LAB — C-REACTIVE PROTEIN: CRP: 4.3 mg/dL — ABNORMAL HIGH (ref <1.0)

## 2019-07-12 MED ORDER — ENOXAPARIN SODIUM 40 MG/0.4ML ~~LOC~~ SOLN
40.0000 mg | SUBCUTANEOUS | Status: DC
  Administered 2019-07-12 – 2019-07-14 (×3): 40 mg via SUBCUTANEOUS
  Filled 2019-07-12 (×3): qty 0.4

## 2019-07-12 NOTE — Progress Notes (Signed)
PROGRESS NOTE                                                                                                                                                                                                             Patient Demographics:    Eric [name], is a 63 y.o. male, DOB - 11/02/1956, ZOX:[id number]  Outpatient Primary MD for the patient is Elias Elseeade, Robert, MD   Admit date - 07/08/2019   LOS - 4  Chief Complaint  Patient presents with   Shortness of Breath       Brief Narrative: Patient is a 63 y.o. male with PMHx of HTN, bicuspid aortic valve-presented with 4-day history of shortness of breath-found to have acute hypoxemic respiratory failure in the setting of COVID-19 pneumonia.  See below for further details   Subjective:    Eric [name] feels better-titrated down to 3 L of oxygen this morning.  Claims he has less shortness of breath when he ambulates.   Assessment  & Plan :   Acute Hypoxic Resp Failure due to Covid 19 Viral pneumonia with superimposed bacterial pneumonia: Continues to slowly improve-oxygen decreased to 2 L.  Continue steroids, Remdesivir and empiric Rocephin and Zithromax.  Given elevated procalcitonin and concern for bacterial superinfection-Actemra not given.  Fever: Afebrile  O2 requirements: On 3/m (was on 5L yesterday)  COVID-19 Labs: Recent Labs    07/10/19 0215 07/11/19 0410 07/12/19 0307  DDIMER 4.11* 3.02* 3.26*  FERRITIN 1,418* 1,059* 874*  CRP 12.8* 6.6* 4.3*    COVID-19 Medications: Steroids: Decadron 8/17 >> 8/18, Solu-Medrol 8/19>> Remdesivir: 8/17>> Actemra: Not given Convalescent Plasma:N/A Research Studies:N/A  Other medications: Diuretics: Euvolemic-no indication for diuretics Antibiotics: Rocephin/Zithromax 8/17>>  Prone/Incentive Spirometry: encouraged patient to lie prone for 3-4 hours at a time for a total of 16 hours a day, and to encourage incentive  spirometry use 3-4/hour.  DVT Prophylaxis  : Heparin  AKI on CKD stage III: Likely hemodynamically mediated-improved-close to usual baseline.  Follow electrolytes periodically.    HTN: Blood pressure controlled without the use any antihypertensives-follow  ABG:    Component Value Date/Time   PHART 7.407 07/10/2019 1250   PCO2ART 33.1 07/10/2019 1250   PO2ART 93.0 07/10/2019 1250   HCO3 20.8 07/10/2019 1250   TCO2 22 07/10/2019 1250   ACIDBASEDEF 3.0 (H) 07/10/2019 1250  O2SAT 97.0 07/10/2019 1250    Vent Settings: N/A  Condition -stable  Family Communication  :  Spouse updated over the phone on 8/21  Code Status :  Full Code  Diet :  Diet Order            Diet Heart Room service appropriate? Yes; Fluid consistency: Thin  Diet effective now               Disposition Plan  :  Remain hospitalized-not stable for discharge-require several more days of hospitalization.  Consults  :  None  Procedures  :  None  Antibiotics  :    Anti-infectives (From admission, onward)   Start     Dose/Rate Route Frequency Ordered Stop   07/09/19 1500  remdesivir 100 mg in sodium chloride 0.9 % 250 mL IVPB     100 mg 500 mL/hr over 30 Minutes Intravenous Every 24 hours 07/08/19 1348 07/13/19 1459   07/08/19 1500  azithromycin (ZITHROMAX) 500 mg in sodium chloride 0.9 % 250 mL IVPB     500 mg 250 mL/hr over 60 Minutes Intravenous Every 24 hours 07/08/19 1328 07/13/19 1459   07/08/19 1500  remdesivir 200 mg in sodium chloride 0.9 % 250 mL IVPB     200 mg 500 mL/hr over 30 Minutes Intravenous Once 07/08/19 1348 07/08/19 1704   07/08/19 1400  cefTRIAXone (ROCEPHIN) 2 g in sodium chloride 0.9 % 100 mL IVPB     2 g 200 mL/hr over 30 Minutes Intravenous Every 24 hours 07/08/19 1328 07/12/19 1357      Inpatient Medications  Scheduled Meds:  cholecalciferol  1,000 Units Oral Daily   enoxaparin (LOVENOX) injection  40 mg Subcutaneous Q24H   methylPREDNISolone (SOLU-MEDROL)  injection  40 mg Intravenous Q12H   vitamin C  500 mg Oral Daily   zinc sulfate  220 mg Oral Daily   Continuous Infusions:  azithromycin Stopped (07/11/19 1707)   remdesivir 100 mg in NS 250 mL Stopped (07/11/19 1554)   PRN Meds:.acetaminophen **OR** acetaminophen, chlorpheniramine-HYDROcodone, guaiFENesin-dextromethorphan  Time Spent in minutes  25   See all Orders from today for further details  Jeoffrey MassedShanker Quaniya Damas M.D on 07/12/2019 at 1:57 PM  To page go to [email or web address] - use universal password  [facility] -  Office  [phone]    Objective:   Vitals:   07/11/19 0430 07/11/19 1933 07/12/19 0503 07/12/19 0804  BP: (!) 138/58 (!) 158/79 133/66 (!) 117/57  Pulse:  75 78 65  Resp:  17 18   Temp: 98 F (36.7 C) 98 F (36.7 C) 98.2 F (36.8 C) 98.4 F (36.9 C)  TempSrc: Oral Oral Oral Oral  SpO2:  100% 96% 91%  Weight:      Height:        Wt Readings from Last 3 Encounters:  07/08/19 94.6 kg  10/08/18 94.5 kg  08/31/17 97.3 kg     Intake/Output Summary (Last 24 hours) at 07/12/2019 1357 Last data filed at 07/12/2019 1333 Gross per 24 hour  Intake 1454.26 ml  Output 450 ml  Net 1004.26 ml     Physical Exam Gen Exam:Alert awake-not in any distress HEENT:atraumatic, normocephalic Chest: B/L clear to auscultation anteriorly CVS:S1S2 regular Abdomen:soft non tender, non distended Extremities:no edema Neurology: Non focal Skin: no rash  Data Review:    CBC Recent Labs  Lab 07/08/19 1155 07/09/19 0205 07/10/19 0215 07/10/19 1250 07/11/19 0410 07/12/19 0307  WBC 8.5 4.4 12.4*  --  12.7* 16.1*  HGB 10.5* 9.8* 11.0* 9.9* 10.7* 11.8*  HCT 29.9* 28.0* 32.3* 29.0* 31.5* 34.7*  PLT 386 430* 529*  --  508* 707*  MCV 73.3* 73.7* 75.6*  --  74.8* 76.1*  MCH 25.7* 25.8* 25.8*  --  25.4* 25.9*  MCHC 35.1 35.0 34.1  --  34.0 34.0  RDW 13.2 13.4 13.6  --  13.7 13.9  LYMPHSABS 0.4* 0.3* 0.6*  --  0.5* 0.8  MONOABS 0.4 0.2 0.5  --  0.5 0.6  EOSABS  0.0 0.0 0.0  --  0.0 0.0  BASOSABS 0.0 0.0 0.0  --  0.0 0.0    Chemistries  Recent Labs  Lab 07/08/19 1155 07/09/19 0205 07/10/19 0215 07/10/19 1250 07/11/19 0410 07/12/19 0307  NA 134* 135 140 140 141 142  K 4.1 4.9 4.7 4.7 5.3* 4.5  CL 98 101 106  --  110 109  CO2 21* 22 20*  --  21* 22  GLUCOSE 126* 168* 123*  --  113* 113*  BUN 42* 44* 51*  --  55* 48*  CREATININE 2.17* 1.87* 1.82*  --  1.68* 1.71*  CALCIUM 8.4* 8.3* 8.8*  --  8.6* 9.1  MG  --  2.6* 2.6*  --   --   --   AST 92* 67* 53*  --  33 40  ALT 85* 75* 74*  --  59* 68*  ALKPHOS 84 78 80  --  69 76  BILITOT 0.9 0.4 0.4  --  0.6 0.3   ------------------------------------------------------------------------------------------------------------------ Recent Labs    07/10/19 0215  TRIG 98    No results found for: HGBA1C ------------------------------------------------------------------------------------------------------------------ No results for input(s): TSH, T4TOTAL, T3FREE, THYROIDAB in the last 72 hours.  Invalid input(s): FREET3 ------------------------------------------------------------------------------------------------------------------ Recent Labs    07/11/19 0410 07/12/19 0307  FERRITIN 1,059* 874*    Coagulation profile No results for input(s): INR, PROTIME in the last 168 hours.  Recent Labs    07/11/19 0410 07/12/19 0307  DDIMER 3.02* 3.26*    Cardiac Enzymes No results for input(s): CKMB, TROPONINI, MYOGLOBIN in the last 168 hours.  Invalid input(s): CK ------------------------------------------------------------------------------------------------------------------    Component Value Date/Time   BNP 69.1 07/08/2019 1123    Micro Results Recent Results (from the past 240 hour(s))  SARS Coronavirus 2 [facility] order, Performed in [facility] hospital lab) Nasopharyngeal Nasopharyngeal Swab     Status: Abnormal   Collection Time: 07/08/19 11:22 AM   Specimen: Nasopharyngeal  Swab  Result Value Ref Range Status   SARS Coronavirus 2 POSITIVE (A) NEGATIVE Final    Comment: RESULT CALLED TO, READ BACK BY AND VERIFIED WITH: Grant Fontana. RN @1311  07/08/2019 BY NMCCOY  (NOTE) If result is NEGATIVE SARS-CoV-2 target nucleic acids are NOT DETECTED. The SARS-CoV-2 RNA is generally detectable in upper and lower  respiratory specimens during the acute phase of infection. The lowest  concentration of SARS-CoV-2 viral copies this assay can detect is 250  copies / mL. A negative result does not preclude SARS-CoV-2 infection  and should not be used as the sole basis for treatment or other  patient management decisions.  A negative result may occur with  improper specimen collection / handling, submission of specimen other  than nasopharyngeal swab, presence of viral mutation(s) within the  areas targeted by this assay, and inadequate number of viral copies  (<250 copies / mL). A negative result must be combined with clinical  observations, patient history, and epidemiological information. If result is POSITIVE SARS-CoV-2 target nucleic acids  are DETECT ED. The SARS-CoV-2 RNA is generally detectable in upper and lower  respiratory specimens during the acute phase of infection.  Positive  results are indicative of active infection with SARS-CoV-2.  Clinical  correlation with patient history and other diagnostic information is  necessary to determine patient infection status.  Positive results do  not rule out bacterial infection or co-infection with other viruses. If result is PRESUMPTIVE POSTIVE SARS-CoV-2 nucleic acids MAY BE PRESENT.   A presumptive positive result was obtained on the submitted specimen  and confirmed on repeat testing.  While 2019 novel coronavirus  (SARS-CoV-2) nucleic acids may be present in the submitted sample  additional confirmatory testing may be necessary for epidemiological  and / or clinical management purposes  to differentiate between    SARS-CoV-2 and other Sarbecovirus currently known to infect humans.  If clinically indicated additional testing with an alternate test  methodology (LAB74 53) is advised. The SARS-CoV-2 RNA is generally  detectable in upper and lower respiratory specimens during the acute  phase of infection. The expected result is Negative. Fact Sheet for Patients:  BoilerBrush.com.cy Fact Sheet for Healthcare Providers: https://pope.com/ This test is not yet approved or cleared by the [place] FDA and has been authorized for detection and/or diagnosis of SARS-CoV-2 by FDA under an Emergency Use Authorization (EUA).  This EUA will remain in effect (meaning this test can be used) for the duration of the COVID-19 declaration under Section 564(b)(1) of the Act, 21 U.S.C. section 360bbb-3(b)(1), unless the authorization is terminated or revoked sooner. Performed at [facility], [address], [place], [place] [address]   Blood Culture (routine x 2)     Status: None (Preliminary result)   Collection Time: 07/08/19 11:40 AM   Specimen: BLOOD  Result Value Ref Range Status   Specimen Description   Final    BLOOD LEFT ANTECUBITAL Performed at [facility], [address], [place], [place] [address]    Special Requests   Final    BOTTLES DRAWN AEROBIC AND ANAEROBIC Blood Culture adequate volume Performed at [facility], [address] [place], [place] [address]    Culture   Final    NO GROWTH 4 DAYS Performed at [facility], [address] [place], [place] [address]    Report Status PENDING  Incomplete  Blood Culture (routine x 2)     Status: None (Preliminary result)   Collection Time: 07/08/19 11:45 AM   Specimen: BLOOD  Result Value Ref Range Status   Specimen Description   Final    BLOOD RIGHT HAND Performed at [facility], [address],  [place], [place] [address]    Special Requests   Final    BOTTLES DRAWN AEROBIC AND ANAEROBIC Blood Culture adequate volume Performed at [facility], [address], [place], [place] [address]    Culture   Final    NO GROWTH 4 DAYS Performed at [facility], [address] [place], [place] [address]    Report Status PENDING  Incomplete  Culture, sputum-assessment     Status: None   Collection Time: 07/09/19  7:50 AM   Specimen: Sputum  Result Value Ref Range Status   Specimen Description SPUTUM  Final   Special Requests NONE  Final   Sputum evaluation   Final    THIS SPECIMEN IS ACCEPTABLE FOR SPUTUM CULTURE Performed at [facility], [address] [place],  [place] [address]    Report Status 07/09/2019 FINAL  Final  Culture, respiratory     Status: None   Collection Time: 07/09/19  7:50 AM   Specimen: SPU  Result Value Ref Range Status   Specimen Description   Final    SPUTUM Performed at [facility], [address] [place], [place] [address]    Special Requests   Final    NONE Reflexed from [id number] Performed at [facility], [address], [place], [place] [address]    Gram Stain NO WBC SEEN FEW GRAM VARIABLE ROD   Final   Culture   Final    Consistent with normal respiratory flora. Performed at [facility], [address] [place], [place] [address]    Report Status 07/12/2019 FINAL  Final    Radiology Reports Dg Chest Port 1 View  Result Date: 07/10/2019 CLINICAL DATA:  Respiratory distress, history of COVID-19 positivity EXAM: PORTABLE CHEST 1 VIEW COMPARISON:  07/08/2019 FINDINGS: Cardiac shadow is stable. Lungs are well aerated bilaterally. Persistent left mid and lower lung infiltrate is seen although slightly improved when compared with the prior exam. Patchy changes are noted in the right lung as well. No bony abnormality is seen. IMPRESSION: Bilateral infiltrates although  somewhat improved when compared with the prior exam. Electronically Signed   By: Alcide Clever M.D.   On: 07/10/2019 09:35   Dg Chest Port 1 View  Result Date: 07/08/2019 CLINICAL DATA:  Cough, shortness of breath. EXAM: PORTABLE CHEST 1 VIEW COMPARISON:  Radiographs of August 24, 2015. FINDINGS: Stable cardiomediastinal silhouette. No pneumothorax is noted. Mild bilateral perihilar and basilar opacities are noted concerning for atelectasis or infiltrates. Small left pleural effusion may be present. Bony thorax is unremarkable. IMPRESSION: Mild bilateral perihilar and basilar opacities are noted concerning for atelectasis or possibly multifocal pneumonia. Small left pleural effusion is noted. Electronically Signed   By: Lupita Raider M.D.   On: 07/08/2019 11:59   Vas [place] Lower Extremity Venous (dvt)  Result Date: 07/10/2019  Lower Venous Study Indications: Elevated Ddimer.  Risk Factors: COVID 19 positive. Comparison Study: No prior studies. Performing Technologist: Chanda Busing RVT  Examination Guidelines: A complete evaluation includes B-mode imaging, spectral Doppler, color Doppler, and power Doppler as needed of all accessible portions of each vessel. Bilateral testing is considered an integral part of a complete examination. Limited examinations for reoccurring indications may be performed as noted.  +---------+---------------+---------+-----------+----------+--------------+  RIGHT     Compressibility Phasicity Spontaneity Properties Thrombus Aging  +---------+---------------+---------+-----------+----------+--------------+  CFV       Full            Yes       Yes                                    +---------+---------------+---------+-----------+----------+--------------+  SFJ       Full                                                             +---------+---------------+---------+-----------+----------+--------------+  FV Prox   Full                                                              +---------+---------------+---------+-----------+----------+--------------+  FV Mid    Full                                                             +---------+---------------+---------+-----------+----------+--------------+  FV Distal Full                                                             +---------+---------------+---------+-----------+----------+--------------+  PFV       Full                                                             +---------+---------------+---------+-----------+----------+--------------+  POP       Full            Yes       Yes                                    +---------+---------------+---------+-----------+----------+--------------+  PTV       Full                                                             +---------+---------------+---------+-----------+----------+--------------+  PERO      Full                                                             +---------+---------------+---------+-----------+----------+--------------+   +---------+---------------+---------+-----------+----------+--------------+  LEFT      Compressibility Phasicity Spontaneity Properties Thrombus Aging  +---------+---------------+---------+-----------+----------+--------------+  CFV       Full            Yes       Yes                                    +---------+---------------+---------+-----------+----------+--------------+  SFJ       Full                                                             +---------+---------------+---------+-----------+----------+--------------+  FV Prox   Full                                                             +---------+---------------+---------+-----------+----------+--------------+  FV Mid    Full                                                             +---------+---------------+---------+-----------+----------+--------------+  FV Distal Full                                                              +---------+---------------+---------+-----------+----------+--------------+  PFV       Full                                                             +---------+---------------+---------+-----------+----------+--------------+  POP       Full            Yes       Yes                                    +---------+---------------+---------+-----------+----------+--------------+  PTV       Full                                                             +---------+---------------+---------+-----------+----------+--------------+  PERO      Full                                                             +---------+---------------+---------+-----------+----------+--------------+     Summary: Right: There is no evidence of deep vein thrombosis in the lower extremity. No cystic structure found in the popliteal fossa. Left: There is no evidence of deep vein thrombosis in the lower extremity. No cystic structure found in the popliteal fossa.  *See table(s) above for measurements and observations. Electronically signed by Gretta Beganodd Early MD on 07/10/2019 at 4:25:14 PM.    Final

## 2019-07-12 NOTE — Progress Notes (Signed)
SATURATION QUALIFICATIONS: (This note is used to comply with regulatory documentation for home oxygen)  Patient Saturations on Room Air at Rest = 83%  Patient Saturations on Room Air while Ambulating = 77%  Patient Saturations on 4 Liters of oxygen while Ambulating = 64%  Please briefly explain why patient needs home oxygen:  To maintain adequate O2 levels above 90%.

## 2019-07-12 NOTE — Evaluation (Signed)
Physical Therapy Evaluation Patient Details Name: Eric [name] PostJerome J Frenkel MRN: [id number] DOB: [birth year] Today's Date: 07/12/2019   History of Present Illness  63 y.o. male with PMHx of HTN, bicuspid aortic valve-presented with 4-day history of shortness of breath-found to have acute hypoxemic respiratory failure in the setting of COVID-19 pneumonia.  Clinical Impression  Pt admitted with above diagnosis.  Pt  Very agreeable to mobility.  Pt desats rapidly although with minimal respiratory distress/minimal dyspnea.  Resting O2 on 3L low 90s,  Pt desats to 77% on 3-4L, requires ~ 3 minutes to recover to 90s on 3-4L. Will continue to follow in acute setting, should not need f/u PT at d/c  Pt currently with functional limitations due to the deficits listed below (see PT Problem List). Pt will benefit from skilled PT to increase their independence and safety with mobility to allow discharge to the venue listed below.       Follow Up Recommendations No PT follow up    Equipment Recommendations  None recommended by PT    Recommendations for Other Services       Precautions / Restrictions Precautions Precautions: Fall      Mobility  Bed Mobility Overal bed mobility: Needs Assistance Bed Mobility: Supine to Sit     Supine to sit: Supervision     General bed mobility comments: for safety  Transfers Overall transfer level: Needs assistance Equipment used: None Transfers: Sit to/from Stand Sit to Stand: Supervision;Min guard         General transfer comment: for safety, initially incr dyspnea with standing  Ambulation/Gait Ambulation/Gait assistance: Min guard;Supervision Gait Distance (Feet): 100 Feet Assistive device: None Gait Pattern/deviations: Step-through pattern Gait velocity: decr   General Gait Details: steady gait, no LOB, decr sats when ambulatory/RA sats done, pt reports minimal dyspnea with significantly decr sats (64% when RA attempted)  Stairs             Wheelchair Mobility    Modified Rankin (Stroke Patients Only)       Balance Overall balance assessment: Needs assistance Sitting-balance support: Feet supported Sitting balance-Leahy Scale: Good       Standing balance-Leahy Scale: Fair                               Pertinent Vitals/Pain Pain Assessment: No/denies pain    Home Living Family/patient expects to be discharged to:: Private residence Living Arrangements: Spouse/significant other Available Help at Discharge: Family Type of Home: House Home Access: Stairs to enter   Secretary/administratorntrance Stairs-Number of Steps: 2-3 Home Layout: Two level        Prior Function                 Hand Dominance        Extremity/Trunk Assessment   Upper Extremity Assessment Upper Extremity Assessment: Overall WFL for tasks assessed    Lower Extremity Assessment Lower Extremity Assessment: Overall WFL for tasks assessed       Communication      Cognition Arousal/Alertness: Awake/alert Behavior During Therapy: WFL for tasks assessed/performed Overall Cognitive Status: Within Functional Limits for tasks assessed                                        General Comments      Exercises     Assessment/Plan    PT  Assessment Patient needs continued PT services  PT Problem List Decreased activity tolerance;Decreased mobility;Cardiopulmonary status limiting activity       PT Treatment Interventions DME instruction;Gait training;Functional mobility training;Therapeutic activities;Patient/family education;Therapeutic exercise    PT Goals (Current goals can be found in the Care Plan section)  Acute Rehab PT Goals Patient Stated Goal: home soon PT Goal Formulation: With patient Time For Goal Achievement: 07/26/19 Potential to Achieve Goals: Good    Frequency Min 3X/week   Barriers to discharge        Co-evaluation               AM-PAC PT "6 Clicks" Mobility  Outcome Measure  Help needed turning from your back to your side while in a flat bed without using bedrails?: None Help needed moving from lying on your back to sitting on the side of a flat bed without using bedrails?: None Help needed moving to and from a bed to a chair (including a wheelchair)?: A Little Help needed standing up from a chair using your arms (e.g., wheelchair or bedside chair)?: A Little Help needed to walk in hospital room?: A Little Help needed climbing 3-5 steps with a railing? : A Little 6 Click Score: 20    End of Session   Activity Tolerance: Patient limited by fatigue Patient left: in chair;with call bell/phone within reach Nurse Communication: Mobility status PT Visit Diagnosis: Difficulty in walking, not elsewhere classified (R26.2)    Time: 0973-5329 PT Time Calculation (min) (ACUTE ONLY): 20 min   Charges:   PT Evaluation $PT Eval Low Complexity: 1 Low          Kenyon Ana, PT  Pager: [phone] Acute Rehab Dept [facility]): [phone]   07/12/2019   [facility] 07/12/2019, 3:48 PM

## 2019-07-12 NOTE — Progress Notes (Addendum)
Ddimer has trended to <5 and crcl>30. Ok to change SQ heparin to lovenox 40mg  qday (BMI 30) per Dr Darrold Junker, PharmD, BCIDP, AAHIVP, CPP Infectious Disease Pharmacist 07/12/2019 9:20 AM

## 2019-07-12 NOTE — Plan of Care (Signed)
Patient ambulated to restroom on RA - desat to upper 709s-low80s. Increased to 6L to assist with rebound.  Reduced to 3L (Low 90s maintained).

## 2019-07-12 NOTE — Plan of Care (Signed)
Patient's wife Barnett Applebaum updated on condition and plan of care.

## 2019-07-12 NOTE — Progress Notes (Signed)
SATURATION QUALIFICATIONS: (This note is used to comply with regulatory documentation for home oxygen)  Patient Saturations on Room Air at Rest = 77%  Patient Saturations on Room Air while Ambulating = 64%  Patient Saturations on 3-4 Liters of oxygen while Ambulating = 85% (reovered to 91% in ~ 3 mins rest)  Please briefly explain why patient needs home oxygen: Pt requires supplemental O2 to maintain acceptable O2 saturations to allow safe mobility  Kenyon Ana, PT  Pager: [phone] Acute Rehab Dept [facility]): [phone]   07/12/2019

## 2019-07-13 LAB — CULTURE, BLOOD (ROUTINE X 2)
Culture: NO GROWTH
Culture: NO GROWTH
Special Requests: ADEQUATE
Special Requests: ADEQUATE

## 2019-07-13 LAB — D-DIMER, QUANTITATIVE: D-Dimer, Quant: 2.58 ug/mL-FEU — ABNORMAL HIGH (ref 0.00–0.50)

## 2019-07-13 LAB — COMPREHENSIVE METABOLIC PANEL
ALT: 54 U/L — ABNORMAL HIGH (ref 0–44)
AST: 26 U/L (ref 15–41)
Albumin: 2.4 g/dL — ABNORMAL LOW (ref 3.5–5.0)
Alkaline Phosphatase: 59 U/L (ref 38–126)
Anion gap: 7 (ref 5–15)
BUN: 50 mg/dL — ABNORMAL HIGH (ref 8–23)
CO2: 21 mmol/L — ABNORMAL LOW (ref 22–32)
Calcium: 8.8 mg/dL — ABNORMAL LOW (ref 8.9–10.3)
Chloride: 114 mmol/L — ABNORMAL HIGH (ref 98–111)
Creatinine, Ser: 1.59 mg/dL — ABNORMAL HIGH (ref 0.61–1.24)
GFR calc Af Amer: 53 mL/min — ABNORMAL LOW (ref >60)
GFR calc non Af Amer: 46 mL/min — ABNORMAL LOW (ref >60)
Glucose, Bld: 130 mg/dL — ABNORMAL HIGH (ref 70–99)
Potassium: 5.6 mmol/L — ABNORMAL HIGH (ref 3.5–5.1)
Sodium: 142 mmol/L (ref 135–145)
Total Bilirubin: 0.5 mg/dL (ref 0.3–1.2)
Total Protein: 5.8 g/dL — ABNORMAL LOW (ref 6.5–8.1)

## 2019-07-13 LAB — CBC WITH DIFFERENTIAL/PLATELET
Abs Immature Granulocytes: 0.3 10*3/uL — ABNORMAL HIGH (ref 0.00–0.07)
Basophils Absolute: 0 10*3/uL (ref 0.0–0.1)
Basophils Relative: 0 %
Eosinophils Absolute: 0 10*3/uL (ref 0.0–0.5)
Eosinophils Relative: 0 %
HCT: 30.8 % — ABNORMAL LOW (ref 39.0–52.0)
Hemoglobin: 10.5 g/dL — ABNORMAL LOW (ref 13.0–17.0)
Immature Granulocytes: 2 %
Lymphocytes Relative: 4 %
Lymphs Abs: 0.6 10*3/uL — ABNORMAL LOW (ref 0.7–4.0)
MCH: 25.4 pg — ABNORMAL LOW (ref 26.0–34.0)
MCHC: 34.1 g/dL (ref 30.0–36.0)
MCV: 74.6 fL — ABNORMAL LOW (ref 80.0–100.0)
Monocytes Absolute: 0.6 10*3/uL (ref 0.1–1.0)
Monocytes Relative: 4 %
Neutro Abs: 12.6 10*3/uL — ABNORMAL HIGH (ref 1.7–7.7)
Neutrophils Relative %: 90 %
Platelets: 623 10*3/uL — ABNORMAL HIGH (ref 150–400)
RBC: 4.13 MIL/uL — ABNORMAL LOW (ref 4.22–5.81)
RDW: 13.7 % (ref 11.5–15.5)
WBC: 14.1 10*3/uL — ABNORMAL HIGH (ref 4.0–10.5)
nRBC: 0 % (ref 0.0–0.2)

## 2019-07-13 LAB — C-REACTIVE PROTEIN: CRP: 2.2 mg/dL — ABNORMAL HIGH (ref <1.0)

## 2019-07-13 LAB — FERRITIN: Ferritin: 696 ng/mL — ABNORMAL HIGH (ref 24–336)

## 2019-07-13 MED ORDER — SODIUM ZIRCONIUM CYCLOSILICATE 10 G PO PACK
10.0000 g | PACK | Freq: Every day | ORAL | Status: AC
  Administered 2019-07-13 – 2019-07-14 (×2): 10 g via ORAL
  Filled 2019-07-13 (×2): qty 1

## 2019-07-13 MED ORDER — FUROSEMIDE 20 MG PO TABS
40.0000 mg | ORAL_TABLET | Freq: Every day | ORAL | Status: DC
  Administered 2019-07-13 – 2019-07-15 (×3): 40 mg via ORAL
  Filled 2019-07-13 (×3): qty 2

## 2019-07-13 NOTE — Plan of Care (Signed)
Discussed with patient plan of care for the evening, pain management, and ambulation in room with teach back displayed. Patient able to update family tonight was not communicated through nursing staff at this time

## 2019-07-13 NOTE — Progress Notes (Signed)
PROGRESS NOTE                                                                                                                                                                                                             Patient Demographics:    Eric [name], is a 63 y.o. male, DOB - 02-11-1956, ZOX:[id number]  Outpatient Primary MD for the patient is Elias Elseeade, Robert, MD   Admit date - 07/08/2019   LOS - 5  Chief Complaint  Patient presents with   Shortness of Breath       Brief Narrative: Patient is a 63 y.o. male with PMHx of HTN, bicuspid aortic valve-presented with 4-day history of shortness of breath-found to have acute hypoxemic respiratory failure in the setting of COVID-19 pneumonia.  See below for further details   Subjective:    Eric SilversJerome [name] mains unchanged-claims he was not very short of breath when he ambulated to the bathroom.  Appears in no distress.  Denies any nausea vomiting.   Assessment  & Plan :   Acute Hypoxic Resp Failure due to Covid 19 Viral pneumonia with superimposed bacterial pneumonia: Slow improvement continues-on around 4 L of oxygen this morning.  Has finished a course of Rocephin/Zithromax and Remdesivir.  Remains on steroids. Given elevated procalcitonin and concern for bacterial superinfection-Actemra not given.  Plans are to continue to attempt to taper down oxygen over the next day-but suspect will likely require oxygen on discharge.  Fever: Afebrile  O2 requirements: On 4/m (was on 2-4L yesterday)  COVID-19 Labs: Recent Labs    07/11/19 0410 07/12/19 0307 07/13/19 0134  DDIMER 3.02* 3.26* 2.58*  FERRITIN 1,059* 874* 696*  CRP 6.6* 4.3* 2.2*    COVID-19 Medications: Steroids: Decadron 8/17 >> 8/18, Solu-Medrol 8/19>> Remdesivir: 8/17>>8/21 Actemra: Not given Convalescent Plasma:N/A Research Studies:N/A  Other medications: Diuretics: Euvolemic-no indication for  diuretics Antibiotics: Rocephin/Zithromax 8/17>>  Prone/Incentive Spirometry: encouraged patient to lie prone for 3-4 hours at a time for a total of 16 hours a day, and to encourage incentive spirometry use 3-4/hour.  DVT Prophylaxis  : Heparin  AKI on CKD stage III: Likely hemodynamically mediated-improved-close to usual baseline.  Follow electrolytes periodically.    Hyperkalemia: Appears mild-Lasix and Lokelma ordered-repeat electrolytes tomorrow.  HTN: Blood pressure controlled-we will start low-dose Lasix today and monitor.  ABG:  Component Value Date/Time   PHART 7.407 07/10/2019 1250   PCO2ART 33.1 07/10/2019 1250   PO2ART 93.0 07/10/2019 1250   HCO3 20.8 07/10/2019 1250   TCO2 22 07/10/2019 1250   ACIDBASEDEF 3.0 (H) 07/10/2019 1250   O2SAT 97.0 07/10/2019 1250    Vent Settings: N/A  Condition -stable  Family Communication  :  Spouse updated over the phone on 8/21  Code Status :  Full Code  Diet :  Diet Order            Diet Heart Room service appropriate? Yes; Fluid consistency: Thin  Diet effective now               Disposition Plan  :  Remain hospitalized-hopefully home in the next few days  Consults  :  None  Procedures  :  None  Antibiotics  :    Anti-infectives (From admission, onward)   Start     Dose/Rate Route Frequency Ordered Stop   07/09/19 1500  remdesivir 100 mg in sodium chloride 0.9 % 250 mL IVPB     100 mg 500 mL/hr over 30 Minutes Intravenous Every 24 hours 07/08/19 1348 07/12/19 1445   07/08/19 1500  azithromycin (ZITHROMAX) 500 mg in sodium chloride 0.9 % 250 mL IVPB     500 mg 250 mL/hr over 60 Minutes Intravenous Every 24 hours 07/08/19 1328 07/12/19 1859   07/08/19 1500  remdesivir 200 mg in sodium chloride 0.9 % 250 mL IVPB     200 mg 500 mL/hr over 30 Minutes Intravenous Once 07/08/19 1348 07/08/19 1704   07/08/19 1400  cefTRIAXone (ROCEPHIN) 2 g in sodium chloride 0.9 % 100 mL IVPB     2 g 200 mL/hr over 30 Minutes  Intravenous Every 24 hours 07/08/19 1328 07/12/19 1359      Inpatient Medications  Scheduled Meds:  cholecalciferol  1,000 Units Oral Daily   enoxaparin (LOVENOX) injection  40 mg Subcutaneous Q24H   methylPREDNISolone (SOLU-MEDROL) injection  40 mg Intravenous Q12H   sodium zirconium cyclosilicate  10 g Oral Daily   vitamin C  500 mg Oral Daily   zinc sulfate  220 mg Oral Daily   Continuous Infusions:  PRN Meds:.acetaminophen **OR** acetaminophen, chlorpheniramine-HYDROcodone, guaiFENesin-dextromethorphan  Time Spent in minutes  25   See all Orders from today for further details  Jeoffrey Massed M.D on 07/13/2019 at 11:27 AM  To page go to [email or web address] - use universal password  [facility] -  Office  [phone]    Objective:   Vitals:   07/13/19 0415 07/13/19 0500 07/13/19 0540 07/13/19 0754  BP:    (!) 123/57  Pulse: (!) 58 79 75   Resp: 18 (!) 30 18 20   Temp:    98.5 F (36.9 C)  TempSrc:    Oral  SpO2: 96% 96% 91% 91%  Weight:      Height:        Wt Readings from Last 3 Encounters:  07/08/19 94.6 kg  10/08/18 94.5 kg  08/31/17 97.3 kg     Intake/Output Summary (Last 24 hours) at 07/13/2019 1127 Last data filed at 07/13/2019 1000 Gross per 24 hour  Intake 891.79 ml  Output 1500 ml  Net -608.21 ml     Physical Exam Gen Exam:Alert awake-not in any distress HEENT:atraumatic, normocephalic Chest: B/L clear to auscultation anteriorly CVS:S1S2 regular Abdomen:soft non tender, non distended Extremities:no edema Neurology: Non focal Skin: no rash   Data Review:    CBC Recent Labs  Lab 07/09/19 0205 07/10/19 0215 07/10/19 1250 07/11/19 0410 07/12/19 0307 07/13/19 0134  WBC 4.4 12.4*  --  12.7* 16.1* 14.1*  HGB 9.8* 11.0* 9.9* 10.7* 11.8* 10.5*  HCT 28.0* 32.3* 29.0* 31.5* 34.7* 30.8*  PLT 430* 529*  --  508* 707* 623*  MCV 73.7* 75.6*  --  74.8* 76.1* 74.6*  MCH 25.8* 25.8*  --  25.4* 25.9* 25.4*  MCHC 35.0 34.1  --  34.0  34.0 34.1  RDW 13.4 13.6  --  13.7 13.9 13.7  LYMPHSABS 0.3* 0.6*  --  0.5* 0.8 0.6*  MONOABS 0.2 0.5  --  0.5 0.6 0.6  EOSABS 0.0 0.0  --  0.0 0.0 0.0  BASOSABS 0.0 0.0  --  0.0 0.0 0.0    Chemistries  Recent Labs  Lab 07/09/19 0205 07/10/19 0215 07/10/19 1250 07/11/19 0410 07/12/19 0307 07/13/19 0134  NA 135 140 140 141 142 142  K 4.9 4.7 4.7 5.3* 4.5 5.6*  CL 101 106  --  110 109 114*  CO2 22 20*  --  21* 22 21*  GLUCOSE 168* 123*  --  113* 113* 130*  BUN 44* 51*  --  55* 48* 50*  CREATININE 1.87* 1.82*  --  1.68* 1.71* 1.59*  CALCIUM 8.3* 8.8*  --  8.6* 9.1 8.8*  MG 2.6* 2.6*  --   --   --   --   AST 67* 53*  --  33 40 26  ALT 75* 74*  --  59* 68* 54*  ALKPHOS 78 80  --  69 76 59  BILITOT 0.4 0.4  --  0.6 0.3 0.5   ------------------------------------------------------------------------------------------------------------------ No results for input(s): CHOL, HDL, LDLCALC, TRIG, CHOLHDL, LDLDIRECT in the last 72 hours.  No results found for: HGBA1C ------------------------------------------------------------------------------------------------------------------ No results for input(s): TSH, T4TOTAL, T3FREE, THYROIDAB in the last 72 hours.  Invalid input(s): FREET3 ------------------------------------------------------------------------------------------------------------------ Recent Labs    07/12/19 0307 07/13/19 0134  FERRITIN 874* 696*    Coagulation profile No results for input(s): INR, PROTIME in the last 168 hours.  Recent Labs    07/12/19 0307 07/13/19 0134  DDIMER 3.26* 2.58*    Cardiac Enzymes No results for input(s): CKMB, TROPONINI, MYOGLOBIN in the last 168 hours.  Invalid input(s): CK ------------------------------------------------------------------------------------------------------------------    Component Value Date/Time   BNP 69.1 07/08/2019 1123    Micro Results Recent Results (from the past 240 hour(s))  SARS Coronavirus 2  [facility] order, Performed in [facility] hospital lab) Nasopharyngeal Nasopharyngeal Swab     Status: Abnormal   Collection Time: 07/08/19 11:22 AM   Specimen: Nasopharyngeal Swab  Result Value Ref Range Status   SARS Coronavirus 2 POSITIVE (A) NEGATIVE Final    Comment: RESULT CALLED TO, READ BACK BY AND VERIFIED WITH: Ashby Dawes. RN @1311  07/08/2019 BY NMCCOY  (NOTE) If result is NEGATIVE SARS-CoV-2 target nucleic acids are NOT DETECTED. The SARS-CoV-2 RNA is generally detectable in upper and lower  respiratory specimens during the acute phase of infection. The lowest  concentration of SARS-CoV-2 viral copies this assay can detect is 250  copies / mL. A negative result does not preclude SARS-CoV-2 infection  and should not be used as the sole basis for treatment or other  patient management decisions.  A negative result may occur with  improper specimen collection / handling, submission of specimen other  than nasopharyngeal swab, presence of viral mutation(s) within the  areas targeted by this assay, and inadequate number of viral copies  (<  250 copies / mL). A negative result must be combined with clinical  observations, patient history, and epidemiological information. If result is POSITIVE SARS-CoV-2 target nucleic acids are DETECT ED. The SARS-CoV-2 RNA is generally detectable in upper and lower  respiratory specimens during the acute phase of infection.  Positive  results are indicative of active infection with SARS-CoV-2.  Clinical  correlation with patient history and other diagnostic information is  necessary to determine patient infection status.  Positive results do  not rule out bacterial infection or co-infection with other viruses. If result is PRESUMPTIVE POSTIVE SARS-CoV-2 nucleic acids MAY BE PRESENT.   A presumptive positive result was obtained on the submitted specimen  and confirmed on repeat testing.  While 2019 novel coronavirus  (SARS-CoV-2) nucleic acids may  be present in the submitted sample  additional confirmatory testing may be necessary for epidemiological  and / or clinical management purposes  to differentiate between  SARS-CoV-2 and other Sarbecovirus currently known to infect humans.  If clinically indicated additional testing with an alternate test  methodology (LAB74 53) is advised. The SARS-CoV-2 RNA is generally  detectable in upper and lower respiratory specimens during the acute  phase of infection. The expected result is Negative. Fact Sheet for Patients:  BoilerBrush.com.cyhttps://www.fda.gov/media/136312/download Fact Sheet for Healthcare Providers: https://pope.com/https://www.fda.gov/media/136313/download This test is not yet approved or cleared by the [place] FDA and has been authorized for detection and/or diagnosis of SARS-CoV-2 by FDA under an Emergency Use Authorization (EUA).  This EUA will remain in effect (meaning this test can be used) for the duration of the COVID-19 declaration under Section 564(b)(1) of the Act, 21 U.S.C. section 360bbb-3(b)(1), unless the authorization is terminated or revoked sooner. Performed at [facility], [address] [place], [place] [address]   Blood Culture (routine x 2)     Status: None   Collection Time: 07/08/19 11:40 AM   Specimen: BLOOD  Result Value Ref Range Status   Specimen Description   Final    BLOOD LEFT ANTECUBITAL Performed at [facility], [address] [place], [place] [address]    Special Requests   Final    BOTTLES DRAWN AEROBIC AND ANAEROBIC Blood Culture adequate volume Performed at [facility], [address] [place], [place] [address]    Culture   Final    NO GROWTH 5 DAYS Performed at [facility], [address] [place], [place] [address]    Report Status 07/13/2019 FINAL  Final  Blood Culture (routine x 2)     Status: None   Collection Time: 07/08/19 11:45 AM   Specimen: BLOOD  Result Value Ref Range  Status   Specimen Description   Final    BLOOD RIGHT HAND Performed at [facility], [address] [place], [place] [address]    Special Requests   Final    BOTTLES DRAWN AEROBIC AND ANAEROBIC Blood Culture adequate volume Performed at [facility], [address] [place], [place] [address]    Culture   Final    NO GROWTH 5 DAYS Performed at [facility], [address] [place], [place] [address]    Report Status 07/13/2019 FINAL  Final  Culture, sputum-assessment     Status: None   Collection Time: 07/09/19  7:50 AM   Specimen: Sputum  Result Value Ref Range Status   Specimen Description SPUTUM  Final   Special Requests NONE  Final   Sputum evaluation  Final    THIS SPECIMEN IS ACCEPTABLE FOR SPUTUM CULTURE Performed at [facility] [address], [place], [place] [address]    Report Status 07/09/2019 FINAL  Final  Culture, respiratory     Status: None   Collection Time: 07/09/19  7:50 AM   Specimen: SPU  Result Value Ref Range Status   Specimen Description   Final    SPUTUM Performed at [facility] [address] [place], [place] [address]    Special Requests   Final    NONE Reflexed from [id number] Performed at [facility], [place] [address], [place], [place] [address]    Gram Stain NO WBC SEEN FEW GRAM VARIABLE ROD   Final   Culture   Final    Consistent with normal respiratory flora. Performed at [facility], [place] [address] [place], [place] [address]    Report Status 07/12/2019 FINAL  Final    Radiology Reports Dg Chest Port 1 View  Result Date: 07/10/2019 CLINICAL DATA:  Respiratory distress, history of COVID-19 positivity EXAM: PORTABLE CHEST 1 VIEW COMPARISON:  07/08/2019 FINDINGS: Cardiac shadow is stable. Lungs are well aerated bilaterally. Persistent left mid and lower lung infiltrate is seen although slightly improved when compared with the  prior exam. Patchy changes are noted in the right lung as well. No bony abnormality is seen. IMPRESSION: Bilateral infiltrates although somewhat improved when compared with the prior exam. Electronically Signed   By: Inez Catalina M.D.   On: 07/10/2019 09:35   Dg Chest Port 1 View  Result Date: 07/08/2019 CLINICAL DATA:  Cough, shortness of breath. EXAM: PORTABLE CHEST 1 VIEW COMPARISON:  Radiographs of August 24, 2015. FINDINGS: Stable cardiomediastinal silhouette. No pneumothorax is noted. Mild bilateral perihilar and basilar opacities are noted concerning for atelectasis or infiltrates. Small left pleural effusion may be present. Bony thorax is unremarkable. IMPRESSION: Mild bilateral perihilar and basilar opacities are noted concerning for atelectasis or possibly multifocal pneumonia. Small left pleural effusion is noted. Electronically Signed   By: Marijo Conception M.D.   On: 07/08/2019 11:59   Vas [place] Lower Extremity Venous (dvt)  Result Date: 07/10/2019  Lower Venous Study Indications: Elevated Ddimer.  Risk Factors: COVID 19 positive. Comparison Study: No prior studies. Performing Technologist: Oliver Hum RVT  Examination Guidelines: A complete evaluation includes B-mode imaging, spectral Doppler, color Doppler, and power Doppler as needed of all accessible portions of each vessel. Bilateral testing is considered an integral part of a complete examination. Limited examinations for reoccurring indications may be performed as noted.  +---------+---------------+---------+-----------+----------+--------------+  RIGHT     Compressibility Phasicity Spontaneity Properties Thrombus Aging  +---------+---------------+---------+-----------+----------+--------------+  CFV       Full            Yes       Yes                                    +---------+---------------+---------+-----------+----------+--------------+  SFJ       Full                                                              +---------+---------------+---------+-----------+----------+--------------+  FV Prox  Full                                                             +---------+---------------+---------+-----------+----------+--------------+  FV Mid    Full                                                             +---------+---------------+---------+-----------+----------+--------------+  FV Distal Full                                                             +---------+---------------+---------+-----------+----------+--------------+  PFV       Full                                                             +---------+---------------+---------+-----------+----------+--------------+  POP       Full            Yes       Yes                                    +---------+---------------+---------+-----------+----------+--------------+  PTV       Full                                                             +---------+---------------+---------+-----------+----------+--------------+  PERO      Full                                                             +---------+---------------+---------+-----------+----------+--------------+   +---------+---------------+---------+-----------+----------+--------------+  LEFT      Compressibility Phasicity Spontaneity Properties Thrombus Aging  +---------+---------------+---------+-----------+----------+--------------+  CFV       Full            Yes       Yes                                    +---------+---------------+---------+-----------+----------+--------------+  SFJ       Full                                                             +---------+---------------+---------+-----------+----------+--------------+  FV Prox   Full                                                             +---------+---------------+---------+-----------+----------+--------------+  FV Mid    Full                                                              +---------+---------------+---------+-----------+----------+--------------+  FV Distal Full                                                             +---------+---------------+---------+-----------+----------+--------------+  PFV       Full                                                             +---------+---------------+---------+-----------+----------+--------------+  POP       Full            Yes       Yes                                    +---------+---------------+---------+-----------+----------+--------------+  PTV       Full                                                             +---------+---------------+---------+-----------+----------+--------------+  PERO      Full                                                             +---------+---------------+---------+-----------+----------+--------------+     Summary: Right: There is no evidence of deep vein thrombosis in the lower extremity. No cystic structure found in the popliteal fossa. Left: There is no evidence of deep vein thrombosis in the lower extremity. No cystic structure found in the popliteal fossa.  *See table(s) above for measurements and observations. Electronically signed by Gretta Beganodd Early MD on 07/10/2019 at 4:25:14 PM.    Final

## 2019-07-13 NOTE — Plan of Care (Addendum)
Patient requiring O2 at 6 liters with activity (bathing and eating) to maintain O2 saturations above 90%. Patient decreased to 4 liters while resting prone and O2 saturations remain above 90%.  18:00 Patient's O2 saturations resting on 3 liters noted to be 96%. Flow rate decreased to 2lpm.

## 2019-07-13 NOTE — Progress Notes (Signed)
Potassium level 5.6 MD has addressed.  11:00 patient has updated his wife on cell phone  13:27 called patient's wife Eric [name] for general update. Patient's wife states she has also spoken to the doctor. All questions answered.

## 2019-07-13 NOTE — Plan of Care (Signed)
  Problem: Health Behavior/Discharge Planning: Goal: Ability to manage health-related needs will improve Outcome: Progressing   Problem: Clinical Measurements: Goal: Will remain free from infection Outcome: Progressing   Problem: Activity: Goal: Risk for activity intolerance will decrease Outcome: Progressing   Problem: Elimination: Goal: Will not experience complications related to bowel motility Outcome: Progressing   Problem: Pain Managment: Goal: General experience of comfort will improve Outcome: Progressing   Problem: Safety: Goal: Ability to remain free from injury will improve Outcome: Progressing

## 2019-07-14 LAB — COMPREHENSIVE METABOLIC PANEL
ALT: 59 U/L — ABNORMAL HIGH (ref 0–44)
AST: 28 U/L (ref 15–41)
Albumin: 2.6 g/dL — ABNORMAL LOW (ref 3.5–5.0)
Alkaline Phosphatase: 57 U/L (ref 38–126)
Anion gap: 10 (ref 5–15)
BUN: 56 mg/dL — ABNORMAL HIGH (ref 8–23)
CO2: 23 mmol/L (ref 22–32)
Calcium: 9.1 mg/dL (ref 8.9–10.3)
Chloride: 108 mmol/L (ref 98–111)
Creatinine, Ser: 1.87 mg/dL — ABNORMAL HIGH (ref 0.61–1.24)
GFR calc Af Amer: 44 mL/min — ABNORMAL LOW (ref >60)
GFR calc non Af Amer: 38 mL/min — ABNORMAL LOW (ref >60)
Glucose, Bld: 119 mg/dL — ABNORMAL HIGH (ref 70–99)
Potassium: 5.5 mmol/L — ABNORMAL HIGH (ref 3.5–5.1)
Sodium: 141 mmol/L (ref 135–145)
Total Bilirubin: 0.8 mg/dL (ref 0.3–1.2)
Total Protein: 6 g/dL — ABNORMAL LOW (ref 6.5–8.1)

## 2019-07-14 LAB — CBC
HCT: 32.1 % — ABNORMAL LOW (ref 39.0–52.0)
Hemoglobin: 11.2 g/dL — ABNORMAL LOW (ref 13.0–17.0)
MCH: 26 pg (ref 26.0–34.0)
MCHC: 34.9 g/dL (ref 30.0–36.0)
MCV: 74.5 fL — ABNORMAL LOW (ref 80.0–100.0)
Platelets: 697 10*3/uL — ABNORMAL HIGH (ref 150–400)
RBC: 4.31 MIL/uL (ref 4.22–5.81)
RDW: 13.8 % (ref 11.5–15.5)
WBC: 13.8 10*3/uL — ABNORMAL HIGH (ref 4.0–10.5)
nRBC: 0 % (ref 0.0–0.2)

## 2019-07-14 LAB — C-REACTIVE PROTEIN: CRP: 1.4 mg/dL — ABNORMAL HIGH (ref <1.0)

## 2019-07-14 LAB — FERRITIN: Ferritin: 739 ng/mL — ABNORMAL HIGH (ref 24–336)

## 2019-07-14 MED ORDER — PREDNISONE 20 MG PO TABS
30.0000 mg | ORAL_TABLET | Freq: Every day | ORAL | Status: DC
  Administered 2019-07-15: 30 mg via ORAL
  Filled 2019-07-14: qty 1

## 2019-07-14 MED ORDER — PREDNISONE 20 MG PO TABS
40.0000 mg | ORAL_TABLET | Freq: Every day | ORAL | Status: DC
  Administered 2019-07-14: 40 mg via ORAL
  Filled 2019-07-14: qty 2

## 2019-07-14 NOTE — Progress Notes (Signed)
PROGRESS NOTE                                                                                                                                                                                                             Patient Demographics:    Eric [name], is a 63 y.o. male, DOB - 20-Mar-1956, WUJ:[id number]  Outpatient Primary MD for the patient is Elias Elseeade, Robert, MD   Admit date - 07/08/2019   LOS - 6  Chief Complaint  Patient presents with   Shortness of Breath       Brief Narrative: Patient is a 63 y.o. male with PMHx of HTN, bicuspid aortic valve-presented with 4-day history of shortness of breath-found to have acute hypoxemic respiratory failure in the setting of COVID-19 pneumonia.  See below for further details   Subjective:    Eric SilversJerome Esch feels better today-claims he is not  really short of breath when he ambulates to the bathroom.   Assessment  & Plan :   Acute Hypoxic Resp Failure due to Covid 19 Viral pneumonia with superimposed bacterial pneumonia: Improved-Down to 2 L of oxygen this morning.  Has completed a course of Rocephin/Zithromax and Remdesivir.  Was on Solu-Medrol initially-but this is now been tapered down to prednisone.  Suspect will require home O2 for a few weeks upon discharge.  Fever: Afebrile  O2 requirements: On 2/m (was on 3-4L yesterday)  COVID-19 Labs: Recent Labs    07/12/19 0307 07/13/19 0134 07/14/19 0350  DDIMER 3.26* 2.58*  --   FERRITIN 874* 696* 739*  CRP 4.3* 2.2* 1.4*    COVID-19 Medications: Steroids: Decadron 8/17 >> 8/18, Solu-Medrol 8/19>> 8/22, prednisone 8/23>> Remdesivir: 8/17>>8/21 Actemra: Not given Convalescent Plasma:N/A Research Studies:N/A  Other medications: Diuretics: Euvolemic-on oral Lasix Antibiotics: Rocephin/Zithromax 8/17>>  Prone/Incentive Spirometry: encouraged patient to lie prone for 3-4 hours at a time for a total of 16 hours a day, and  to encourage incentive spirometry use 3-4/hour.  DVT Prophylaxis  : Heparin  AKI on CKD stage III: Likely hemodynamically mediated-improved-close to usual baseline.  Follow electrolytes periodically.    Hyperkalemia: Continues-although mild-repeat Lokelma and Lasix today.  Recheck electrolytes tomorrow morning  HTN: Blood pressure controlled-continue Lasix  ABG:    Component Value Date/Time   PHART 7.407 07/10/2019 1250   PCO2ART 33.1 07/10/2019 1250   PO2ART 93.0  07/10/2019 1250   HCO3 20.8 07/10/2019 1250   TCO2 22 07/10/2019 1250   ACIDBASEDEF 3.0 (H) 07/10/2019 1250   O2SAT 97.0 07/10/2019 1250    Vent Settings: N/A  Condition -stable  Family Communication  :  Spouse updated over the phone on 8/23  Code Status :  Full Code  Diet :  Diet Order            Diet Heart Room service appropriate? Yes; Fluid consistency: Thin  Diet effective now               Disposition Plan  :  Remain hospitalized-hopefully home on 8/24 if clinical improvement continues  Consults  :  None  Procedures  :  None  Antibiotics  :    Anti-infectives (From admission, onward)   Start     Dose/Rate Route Frequency Ordered Stop   07/09/19 1500  remdesivir 100 mg in sodium chloride 0.9 % 250 mL IVPB     100 mg 500 mL/hr over 30 Minutes Intravenous Every 24 hours 07/08/19 1348 07/12/19 1445   07/08/19 1500  azithromycin (ZITHROMAX) 500 mg in sodium chloride 0.9 % 250 mL IVPB     500 mg 250 mL/hr over 60 Minutes Intravenous Every 24 hours 07/08/19 1328 07/12/19 1859   07/08/19 1500  remdesivir 200 mg in sodium chloride 0.9 % 250 mL IVPB     200 mg 500 mL/hr over 30 Minutes Intravenous Once 07/08/19 1348 07/08/19 1704   07/08/19 1400  cefTRIAXone (ROCEPHIN) 2 g in sodium chloride 0.9 % 100 mL IVPB     2 g 200 mL/hr over 30 Minutes Intravenous Every 24 hours 07/08/19 1328 07/12/19 1359      Inpatient Medications  Scheduled Meds:  cholecalciferol  1,000 Units Oral Daily    enoxaparin (LOVENOX) injection  40 mg Subcutaneous Q24H   furosemide  40 mg Oral Daily   predniSONE  40 mg Oral Q breakfast   sodium zirconium cyclosilicate  10 g Oral Daily   vitamin C  500 mg Oral Daily   zinc sulfate  220 mg Oral Daily   Continuous Infusions:  PRN Meds:.acetaminophen **OR** acetaminophen, chlorpheniramine-HYDROcodone, guaiFENesin-dextromethorphan  Time Spent in minutes  25   See all Orders from today for further details  Jeoffrey MassedShanker Rodgers Likes M.D on 07/14/2019 at 1:09 PM  To page go to [email or web address] - use universal password  [facility] -  Office  [phone]    Objective:   Vitals:   07/14/19 0302 07/14/19 0305 07/14/19 0306 07/14/19 0750  BP:  (!) 108/53  126/67  Pulse:  71 82 88  Resp: 20 (!) 22 18 19   Temp:  98 F (36.7 C)  97.7 F (36.5 C)  TempSrc:  Oral  Oral  SpO2: 90% 92% 92% 90%  Weight:      Height:        Wt Readings from Last 3 Encounters:  07/08/19 94.6 kg  10/08/18 94.5 kg  08/31/17 97.3 kg     Intake/Output Summary (Last 24 hours) at 07/14/2019 1309 Last data filed at 07/14/2019 0750 Gross per 24 hour  Intake 120 ml  Output 950 ml  Net -830 ml     Physical Exam Gen Exam:Alert awake-not in any distress HEENT:atraumatic, normocephalic Chest: B/L clear to auscultation anteriorly CVS:S1S2 regular Abdomen:soft non tender, non distended Extremities:no edema Neurology: Non focal Skin: no rash   Data Review:    CBC Recent Labs  Lab 07/09/19 0205 07/10/19 0215 07/10/19 1250 07/11/19 0410  07/12/19 0307 07/13/19 0134 07/14/19 0350  WBC 4.4 12.4*  --  12.7* 16.1* 14.1* 13.8*  HGB 9.8* 11.0* 9.9* 10.7* 11.8* 10.5* 11.2*  HCT 28.0* 32.3* 29.0* 31.5* 34.7* 30.8* 32.1*  PLT 430* 529*  --  508* 707* 623* 697*  MCV 73.7* 75.6*  --  74.8* 76.1* 74.6* 74.5*  MCH 25.8* 25.8*  --  25.4* 25.9* 25.4* 26.0  MCHC 35.0 34.1  --  34.0 34.0 34.1 34.9  RDW 13.4 13.6  --  13.7 13.9 13.7 13.8  LYMPHSABS 0.3* 0.6*  --  0.5*  0.8 0.6*  --   MONOABS 0.2 0.5  --  0.5 0.6 0.6  --   EOSABS 0.0 0.0  --  0.0 0.0 0.0  --   BASOSABS 0.0 0.0  --  0.0 0.0 0.0  --     Chemistries  Recent Labs  Lab 07/09/19 0205 07/10/19 0215 07/10/19 1250 07/11/19 0410 07/12/19 0307 07/13/19 0134 07/14/19 0350  NA 135 140 140 141 142 142 141  K 4.9 4.7 4.7 5.3* 4.5 5.6* 5.5*  CL 101 106  --  110 109 114* 108  CO2 22 20*  --  21* 22 21* 23  GLUCOSE 168* 123*  --  113* 113* 130* 119*  BUN 44* 51*  --  55* 48* 50* 56*  CREATININE 1.87* 1.82*  --  1.68* 1.71* 1.59* 1.87*  CALCIUM 8.3* 8.8*  --  8.6* 9.1 8.8* 9.1  MG 2.6* 2.6*  --   --   --   --   --   AST 67* 53*  --  33 40 26 28  ALT 75* 74*  --  59* 68* 54* 59*  ALKPHOS 78 80  --  69 76 59 57  BILITOT 0.4 0.4  --  0.6 0.3 0.5 0.8   ------------------------------------------------------------------------------------------------------------------ No results for input(s): CHOL, HDL, LDLCALC, TRIG, CHOLHDL, LDLDIRECT in the last 72 hours.  No results found for: HGBA1C ------------------------------------------------------------------------------------------------------------------ No results for input(s): TSH, T4TOTAL, T3FREE, THYROIDAB in the last 72 hours.  Invalid input(s): FREET3 ------------------------------------------------------------------------------------------------------------------ Recent Labs    07/13/19 0134 07/14/19 0350  FERRITIN 696* 739*    Coagulation profile No results for input(s): INR, PROTIME in the last 168 hours.  Recent Labs    07/12/19 0307 07/13/19 0134  DDIMER 3.26* 2.58*    Cardiac Enzymes No results for input(s): CKMB, TROPONINI, MYOGLOBIN in the last 168 hours.  Invalid input(s): CK ------------------------------------------------------------------------------------------------------------------    Component Value Date/Time   BNP 69.1 07/08/2019 1123    Micro Results Recent Results (from the past 240 hour(s))  SARS  Coronavirus 2 [facility] order, Performed in [facility] hospital lab) Nasopharyngeal Nasopharyngeal Swab     Status: Abnormal   Collection Time: 07/08/19 11:22 AM   Specimen: Nasopharyngeal Swab  Result Value Ref Range Status   SARS Coronavirus 2 POSITIVE (A) NEGATIVE Final    Comment: RESULT CALLED TO, READ BACK BY AND VERIFIED WITH: Ashby Dawes. RN @1311  07/08/2019 BY NMCCOY  (NOTE) If result is NEGATIVE SARS-CoV-2 target nucleic acids are NOT DETECTED. The SARS-CoV-2 RNA is generally detectable in upper and lower  respiratory specimens during the acute phase of infection. The lowest  concentration of SARS-CoV-2 viral copies this assay can detect is 250  copies / mL. A negative result does not preclude SARS-CoV-2 infection  and should not be used as the sole basis for treatment or other  patient management decisions.  A negative result may occur with  improper specimen  collection / handling, submission of specimen other  than nasopharyngeal swab, presence of viral mutation(s) within the  areas targeted by this assay, and inadequate number of viral copies  (<250 copies / mL). A negative result must be combined with clinical  observations, patient history, and epidemiological information. If result is POSITIVE SARS-CoV-2 target nucleic acids are DETECT ED. The SARS-CoV-2 RNA is generally detectable in upper and lower  respiratory specimens during the acute phase of infection.  Positive  results are indicative of active infection with SARS-CoV-2.  Clinical  correlation with patient history and other diagnostic information is  necessary to determine patient infection status.  Positive results do  not rule out bacterial infection or co-infection with other viruses. If result is PRESUMPTIVE POSTIVE SARS-CoV-2 nucleic acids MAY BE PRESENT.   A presumptive positive result was obtained on the submitted specimen  and confirmed on repeat testing.  While 2019 novel coronavirus  (SARS-CoV-2)  nucleic acids may be present in the submitted sample  additional confirmatory testing may be necessary for epidemiological  and / or clinical management purposes  to differentiate between  SARS-CoV-2 and other Sarbecovirus currently known to infect humans.  If clinically indicated additional testing with an alternate test  methodology (LAB74 53) is advised. The SARS-CoV-2 RNA is generally  detectable in upper and lower respiratory specimens during the acute  phase of infection. The expected result is Negative. Fact Sheet for Patients:  StrictlyIdeas.no Fact Sheet for Healthcare Providers: BankingDealers.co.za This test is not yet approved or cleared by the [place] FDA and has been authorized for detection and/or diagnosis of SARS-CoV-2 by FDA under an Emergency Use Authorization (EUA).  This EUA will remain in effect (meaning this test can be used) for the duration of the COVID-19 declaration under Section 564(b)(1) of the Act, 21 U.S.C. section 360bbb-3(b)(1), unless the authorization is terminated or revoked sooner. Performed at [facility], [place] [address] [place], [place] [address]   Blood Culture (routine x 2)     Status: None   Collection Time: 07/08/19 11:40 AM   Specimen: BLOOD  Result Value Ref Range Status   Specimen Description   Final    BLOOD LEFT ANTECUBITAL Performed at [place] [address] [place], [place] [address]    Special Requests   Final    BOTTLES DRAWN AEROBIC AND ANAEROBIC Blood Culture adequate volume Performed at [place] [address] [place], [place] [address]    Culture   Final    NO GROWTH 5 DAYS Performed at [facility], [place] [address], [place], [place] [address]    Report Status 07/13/2019 FINAL  Final  Blood Culture (routine x 2)     Status: None   Collection Time: 07/08/19 11:45 AM   Specimen: BLOOD  Result  Value Ref Range Status   Specimen Description   Final    BLOOD RIGHT HAND Performed at [place] [address] [place], [place] [address]    Special Requests   Final    BOTTLES DRAWN AEROBIC AND ANAEROBIC Blood Culture adequate volume Performed at [place] [address] [place], [place] [address]    Culture   Final    NO GROWTH 5 DAYS Performed at [facility], [place] [address], [place], [place] [address]    Report Status 07/13/2019 FINAL  Final  Culture, sputum-assessment     Status: None   Collection Time: 07/09/19  7:50 AM   Specimen: Sputum  Result Value Ref Range Status   Specimen Description SPUTUM  Final   Special Requests NONE  Final   Sputum evaluation   Final    THIS SPECIMEN IS ACCEPTABLE FOR SPUTUM CULTURE Performed at [facility], [address] [place], [place] [address]    Report Status 07/09/2019 FINAL  Final  Culture, respiratory     Status: None   Collection Time: 07/09/19  7:50 AM   Specimen: SPU  Result Value Ref Range Status   Specimen Description   Final    SPUTUM Performed at [facility], [address] [place], [place] [address]    Special Requests   Final    NONE Reflexed from [id number] Performed at [facility], [address] [place], [place] [address]    Gram Stain NO WBC SEEN FEW GRAM VARIABLE ROD   Final   Culture   Final    Consistent with normal respiratory flora. Performed at [facility], [address] [place], [place] [address]    Report Status 07/12/2019 FINAL  Final    Radiology Reports Dg Chest Port 1 View  Result Date: 07/10/2019 CLINICAL DATA:  Respiratory distress, history of COVID-19 positivity EXAM: PORTABLE CHEST 1 VIEW COMPARISON:  07/08/2019 FINDINGS: Cardiac shadow is stable. Lungs are well aerated bilaterally. Persistent left mid and lower lung infiltrate is seen although slightly improved when  compared with the prior exam. Patchy changes are noted in the right lung as well. No bony abnormality is seen. IMPRESSION: Bilateral infiltrates although somewhat improved when compared with the prior exam. Electronically Signed   By: Alcide CleverMark  Lukens M.D.   On: 07/10/2019 09:35   Dg Chest Port 1 View  Result Date: 07/08/2019 CLINICAL DATA:  Cough, shortness of breath. EXAM: PORTABLE CHEST 1 VIEW COMPARISON:  Radiographs of August 24, 2015. FINDINGS: Stable cardiomediastinal silhouette. No pneumothorax is noted. Mild bilateral perihilar and basilar opacities are noted concerning for atelectasis or infiltrates. Small left pleural effusion may be present. Bony thorax is unremarkable. IMPRESSION: Mild bilateral perihilar and basilar opacities are noted concerning for atelectasis or possibly multifocal pneumonia. Small left pleural effusion is noted. Electronically Signed   By: Lupita RaiderJames  Green Jr M.D.   On: 07/08/2019 11:59   Vas [place]as Lower Extremity Venous (dvt)  Result Date: 07/10/2019  Lower Venous Study Indications: Elevated Ddimer.  Risk Factors: COVID 19 positive. Comparison Study: No prior studies. Performing Technologist: Chanda BusingGregory Collins RVT  Examination Guidelines: A complete evaluation includes B-mode imaging, spectral Doppler, color Doppler, and power Doppler as needed of all accessible portions of each vessel. Bilateral testing is considered an integral part of a complete examination. Limited examinations for reoccurring indications may be performed as noted.  +---------+---------------+---------+-----------+----------+--------------+  RIGHT     Compressibility Phasicity Spontaneity Properties Thrombus Aging  +---------+---------------+---------+-----------+----------+--------------+  CFV       Full            Yes       Yes                                    +---------+---------------+---------+-----------+----------+--------------+  SFJ       Full                                                              +---------+---------------+---------+-----------+----------+--------------+  FV Prox   Full                                                             +---------+---------------+---------+-----------+----------+--------------+  FV Mid    Full                                                             +---------+---------------+---------+-----------+----------+--------------+  FV Distal Full                                                             +---------+---------------+---------+-----------+----------+--------------+  PFV       Full                                                             +---------+---------------+---------+-----------+----------+--------------+  POP       Full            Yes       Yes                                    +---------+---------------+---------+-----------+----------+--------------+  PTV       Full                                                             +---------+---------------+---------+-----------+----------+--------------+  PERO      Full                                                             +---------+---------------+---------+-----------+----------+--------------+   +---------+---------------+---------+-----------+----------+--------------+  LEFT      Compressibility Phasicity Spontaneity Properties Thrombus Aging  +---------+---------------+---------+-----------+----------+--------------+  CFV       Full            Yes       Yes                                    +---------+---------------+---------+-----------+----------+--------------+  SFJ       Full                                                             +---------+---------------+---------+-----------+----------+--------------+  FV Prox   Full                                                             +---------+---------------+---------+-----------+----------+--------------+  FV Mid    Full                                                              +---------+---------------+---------+-----------+----------+--------------+  FV Distal Full                                                             +---------+---------------+---------+-----------+----------+--------------+  PFV       Full                                                             +---------+---------------+---------+-----------+----------+--------------+  POP       Full            Yes       Yes                                    +---------+---------------+---------+-----------+----------+--------------+  PTV       Full                                                             +---------+---------------+---------+-----------+----------+--------------+  PERO      Full                                                             +---------+---------------+---------+-----------+----------+--------------+     Summary: Right: There is no evidence of deep vein thrombosis in the lower extremity. No cystic structure found in the popliteal fossa. Left: There is no evidence of deep vein thrombosis in the lower extremity. No cystic structure found in the popliteal fossa.  *See table(s) above for measurements and observations. Electronically signed by Gretta Beganodd Early MD on 07/10/2019 at 4:25:14 PM.    Final

## 2019-07-14 NOTE — Progress Notes (Signed)
2050 updated wife on pt. Condition, wife would like follow up in am as it relates to O2 being delivered and set up for husbands discharge... all questions answered

## 2019-07-14 NOTE — Plan of Care (Signed)
Pt. Has been injury free since admission, will continue to monitor for safety

## 2019-07-14 NOTE — Progress Notes (Signed)
Spoke with pt primary contact Katherina Right and updated on pt condition and plan of care. Answered all questions and concerns.

## 2019-07-15 DIAGNOSIS — N183 Chronic kidney disease, stage 3 (moderate): Secondary | ICD-10-CM

## 2019-07-15 LAB — COMPREHENSIVE METABOLIC PANEL
ALT: 69 U/L — ABNORMAL HIGH (ref 0–44)
AST: 31 U/L (ref 15–41)
Albumin: 3 g/dL — ABNORMAL LOW (ref 3.5–5.0)
Alkaline Phosphatase: 63 U/L (ref 38–126)
Anion gap: 10 (ref 5–15)
BUN: 66 mg/dL — ABNORMAL HIGH (ref 8–23)
CO2: 23 mmol/L (ref 22–32)
Calcium: 9.3 mg/dL (ref 8.9–10.3)
Chloride: 106 mmol/L (ref 98–111)
Creatinine, Ser: 2.04 mg/dL — ABNORMAL HIGH (ref 0.61–1.24)
GFR calc Af Amer: 39 mL/min — ABNORMAL LOW (ref >60)
GFR calc non Af Amer: 34 mL/min — ABNORMAL LOW (ref >60)
Glucose, Bld: 99 mg/dL (ref 70–99)
Potassium: 4.4 mmol/L (ref 3.5–5.1)
Sodium: 139 mmol/L (ref 135–145)
Total Bilirubin: 0.9 mg/dL (ref 0.3–1.2)
Total Protein: 6.8 g/dL (ref 6.5–8.1)

## 2019-07-15 MED ORDER — BENZONATATE 100 MG PO CAPS: 100.0000 mg | ORAL_CAPSULE | Freq: Four times a day (QID) | ORAL | 0 refills | Status: DC | PRN

## 2019-07-15 MED ORDER — CARVEDILOL 6.25 MG PO TABS: 6.2500 mg | ORAL_TABLET | Freq: Two times a day (BID) | ORAL | 11 refills | Status: DC

## 2019-07-15 MED ORDER — ZINC SULFATE 220 (50 ZN) MG PO CAPS: 220.0000 mg | ORAL_CAPSULE | Freq: Every day | ORAL | 0 refills | Status: DC

## 2019-07-15 MED ORDER — PREDNISONE 10 MG PO TABS: ORAL_TABLET | ORAL | 0 refills | Status: DC

## 2019-07-15 MED ORDER — ALBUTEROL SULFATE HFA 108 (90 BASE) MCG/ACT IN AERS: 2.0000 | INHALATION_SPRAY | Freq: Four times a day (QID) | RESPIRATORY_TRACT | 0 refills | Status: DC | PRN

## 2019-07-15 MED ORDER — ASCORBIC ACID 500 MG PO TABS: 500.0000 mg | ORAL_TABLET | Freq: Every day | ORAL | 0 refills | Status: DC

## 2019-07-15 NOTE — Discharge Summary (Signed)
PATIENT DETAILS Name: Eric [name] Age: 63 y.o. Sex: male Date of Birth: 1956/05/26 MRN: [id number]. Admitting Physician: Zigmund Daniel., MD OZH:[id number], Molly Maduro, MD  Admit Date: 07/08/2019 Discharge date: 07/15/2019  Recommendations for Outpatient Follow-up:  1. Follow up with PCP in 1-2 weeks 2. Please obtain BMP/CBC in one week 3. Please repeat a chest x-ray in 4 to 6 weeks to document resolution of pneumonia 4. Primary care MD to reassess O2 requirement at next visit 5. Valsartan-HCTZ on hold due to AKI-reassess at next visit if this can be resumed.  Admitted From:  Home  Disposition: Home  Home Health:  Equipment/Devices: Oxygen 2L  Discharge Condition: Stable  CODE STATUS: FULL CODE  Diet recommendation:  Diet Order            Diet - low sodium heart healthy        Diet Carb Modified        Diet Heart Room service appropriate? Yes; Fluid consistency: Thin  Diet effective now               Brief Summary: See H&P, Labs, Consult and Test reports for all details in brief, Patient is a 63 y.o. male with PMHx of HTN, bicuspid aortic valve-presented with 4-day history of shortness of breath-found to have acute hypoxemic respiratory failure in the setting of COVID-19 pneumonia.  See below for further details  Brief Hospital Course: Acute Hypoxic Resp Failure due to Covid 19 Viral pneumonia with superimposed bacterial pneumonia: Continues to improve-stable for the past few days on just 2 L of oxygen.  Was treated with a course of Remdesivir, Rocephin and Zithromax.  Was on IV Solu-Medrol but has been tapered down to prednisone which would be tapered to discontinue in the next few days.  Although improved-still requires around 2 L of oxygen to maintain O2 saturations.  PCP to assess at next visit with the patient still requires oxygen or not.   COVID-19 Labs  Recent Labs    07/13/19 0134 07/14/19 0350  DDIMER 2.58*  --   FERRITIN 696* 739*  CRP 2.2*  1.4*    Lab Results  Component Value Date   SARSCOV2NAA POSITIVE (A) 07/08/2019   COVID-19 Medications: Steroids: Decadron 8/17 >> 8/18, Solu-Medrol 8/19>> 8/22, prednisone 8/23>> Remdesivir: 8/17>>8/21 Actemra: Not given due to concern for bacterial coinfection Convalescent Plasma:N/A Research Studies:N/A  AKI on CKD stage III: Likely hemodynamically mediated-improved-close to usual baseline.  Follow electrolytes periodically.    Hyperkalemia:  Resolved-treated with Lokelma and Lasix.  Please recheck electrolytes at next visit with PCP.   HTN: Blood pressure stable-we will discharge on low-dose Coreg-PCP to recheck electrolytes at next visit-and reassess if valsartan-HCTZ can be resumed ( Held in the hospital for AKI.)  Procedures/Studies: None  Discharge Diagnoses:  Active Problems:   Essential hypertension   COVID-19 virus infection   Acute respiratory failure with hypoxia (HCC)   CKD (chronic kidney disease), stage III (HCC)   AKI (acute kidney injury) [facility])   Discharge Instructions:  Activity:  As tolerated   Discharge Instructions    Call MD for:  difficulty breathing, headache or visual disturbances   Complete by: As directed    Call MD for:  extreme fatigue   Complete by: As directed    Call MD for:  persistant dizziness or light-headedness   Complete by: As directed    Call MD for:  persistant nausea and vomiting   Complete by: As directed    Diet -  low sodium heart healthy   Complete by: As directed    Diet Carb Modified   Complete by: As directed    Discharge instructions   Complete by: As directed    Follow with Primary MD  Elias Elseeade, Robert, MD in 1-2 weeks  Please get a complete blood count and chemistry panel checked by your Primary MD at your next visit, and again as instructed by your Primary MD.  Get Medicines reviewed and adjusted: Please take all your medications with you for your next visit with your Primary MD  Laboratory/radiological  data: Please request your Primary MD to go over all hospital tests and procedure/radiological results at the follow up, please ask your Primary MD to get all Hospital records sent to his/her office.  In some cases, they will be blood work, cultures and biopsy results pending at the time of your discharge. Please request that your primary care M.D. follows up on these results.  Also Note the following: If you experience worsening of your admission symptoms, develop shortness of breath, life threatening emergency, suicidal or homicidal thoughts you must seek medical attention immediately by calling 911 or calling your MD immediately  if symptoms less severe.  You must read complete instructions/literature along with all the possible adverse reactions/side effects for all the Medicines you take and that have been prescribed to you. Take any new Medicines after you have completely understood and accpet all the possible adverse reactions/side effects.   Do not drive when taking Pain medications or sleeping medications (Benzodaizepines)  Do not take more than prescribed Pain, Sleep and Anxiety Medications. It is not advisable to combine anxiety,sleep and pain medications without talking with your primary care practitioner  Special Instructions: If you have smoked or chewed Tobacco  in the last 2 yrs please stop smoking, stop any regular Alcohol  and or any Recreational drug use.  Wear Seat belts while driving.  Please note: You were cared for by a hospitalist during your hospital stay. Once you are discharged, your primary care physician will handle any further medical issues. Please note that NO REFILLS for any discharge medications will be authorized once you are discharged, as it is imperative that you return to your primary care physician (or establish a relationship with a primary care physician if you do not have one) for your post hospital discharge needs so that they can reassess your need for  medications and monitor your lab values.  ?   Person Under Monitoring Name: Eric [name]  Location[place] [address] [place] [id number]   Infection Prevention Recommendations for Individuals Confirmed to have, or Being Evaluated for, 2019 Novel Coronavirus (COVID-19) Infection Who Receive Care at Home  Individuals who are confirmed to have, or are being evaluated for, COVID-19 should follow the prevention steps below until a healthcare provider or local or state health department says they can return to normal activities.  Stay home except to get medical care You should restrict activities outside your home, except for getting medical care. Do not go to work, school, or public areas, and do not use public transportation or taxis.  Call ahead before visiting your doctor Before your medical appointment, call the healthcare provider and tell them that you have, or are being evaluated for, COVID-19 infection. This will help the healthcare providers office take steps to keep other people from getting infected. Ask your healthcare provider to call the local or state health department.  Monitor your symptoms Seek prompt medical attention  if your illness is worsening (e.g., difficulty breathing). Before going to your medical appointment, call the healthcare provider and tell them that you have, or are being evaluated for, COVID-19 infection. Ask your healthcare provider to call the local or state health department.  Wear a facemask You should wear a facemask that covers your nose and mouth when you are in the same room with other people and when you visit a healthcare provider. People who live with or visit you should also wear a facemask while they are in the same room with you.  Separate yourself from other people in your home As much as possible, you should stay in a different room from other people in your home. Also, you should use a separate bathroom, if available.  Avoid  sharing household items You should not share dishes, drinking glasses, cups, eating utensils, towels, bedding, or other items with other people in your home. After using these items, you should wash them thoroughly with soap and water.  Cover your coughs and sneezes Cover your mouth and nose with a tissue when you cough or sneeze, or you can cough or sneeze into your sleeve. Throw used tissues in a lined trash can, and immediately wash your hands with soap and water for at least 20 seconds or use an alcohol-based hand rub.  Wash your [employer] your hands often and thoroughly with soap and water for at least 20 seconds. You can use an alcohol-based hand sanitizer if soap and water are not available and if your hands are not visibly dirty. Avoid touching your eyes, nose, and mouth with unwashed hands.   Prevention Steps for Caregivers and Household Members of Individuals Confirmed to have, or Being Evaluated for, COVID-19 Infection Being Cared for in the Home  If you live with, or provide care at home for, a person confirmed to have, or being evaluated for, COVID-19 infection please follow these guidelines to prevent infection:  Follow healthcare providers instructions Make sure that you understand and can help the patient follow any healthcare provider instructions for all care.  Provide for the patients basic needs You should help the patient with basic needs in the home and provide support for getting groceries, prescriptions, and other personal needs.  Monitor the patients symptoms If they are getting sicker, call his or her medical provider and tell them that the patient has, or is being evaluated for, COVID-19 infection. This will help the healthcare providers office take steps to keep other people from getting infected. Ask the healthcare provider to call the local or state health department.  Limit the number of people who have contact with the patient If possible, have  only one caregiver for the patient. Other household members should stay in another home or place of residence. If this is not possible, they should stay in another room, or be separated from the patient as much as possible. Use a separate bathroom, if available. Restrict visitors who do not have an essential need to be in the home.  Keep older adults, very young children, and other sick people away from the patient Keep older adults, very young children, and those who have compromised immune systems or chronic health conditions away from the patient. This includes people with chronic heart, lung, or kidney conditions, diabetes, and cancer.  Ensure good ventilation Make sure that shared spaces in the home have good air flow, such as from an air conditioner or an opened window, weather permitting.  Wash your hands often  Wash your hands often and thoroughly with soap and water for at least 20 seconds. You can use an alcohol based hand sanitizer if soap and water are not available and if your hands are not visibly dirty. Avoid touching your eyes, nose, and mouth with unwashed hands. Use disposable paper towels to dry your hands. If not available, use dedicated cloth towels and replace them when they become wet.  Wear a facemask and gloves Wear a disposable facemask at all times in the room and gloves when you touch or have contact with the patients blood, body fluids, and/or secretions or excretions, such as sweat, saliva, sputum, nasal mucus, vomit, urine, or feces.  Ensure the mask fits over your nose and mouth tightly, and do not touch it during use. Throw out disposable facemasks and gloves after using them. Do not reuse. Wash your hands immediately after removing your facemask and gloves. If your personal clothing becomes contaminated, carefully remove clothing and launder. Wash your hands after handling contaminated clothing. Place all used disposable facemasks, gloves, and other waste in a  lined container before disposing them with other household waste. Remove gloves and wash your hands immediately after handling these items.  Do not share dishes, glasses, or other household items with the patient Avoid sharing household items. You should not share dishes, drinking glasses, cups, eating utensils, towels, bedding, or other items with a patient who is confirmed to have, or being evaluated for, COVID-19 infection. After the person uses these items, you should wash them thoroughly with soap and water.  Wash laundry thoroughly Immediately remove and wash clothes or bedding that have blood, body fluids, and/or secretions or excretions, such as sweat, saliva, sputum, nasal mucus, vomit, urine, or feces, on them. Wear gloves when handling laundry from the patient. Read and follow directions on labels of laundry or clothing items and detergent. In general, wash and dry with the warmest temperatures recommended on the label.  Clean all areas the individual has used often Clean all touchable surfaces, such as counters, tabletops, doorknobs, bathroom fixtures, toilets, phones, keyboards, tablets, and bedside tables, every day. Also, clean any surfaces that may have blood, body fluids, and/or secretions or excretions on them. Wear gloves when cleaning surfaces the patient has come in contact with. Use a diluted bleach solution (e.g., dilute bleach with 1 part bleach and 10 parts water) or a household disinfectant with a label that says EPA-registered for coronaviruses. To make a bleach solution at home, add 1 tablespoon of bleach to 1 quart (4 cups) of water. For a larger supply, add  cup of bleach to 1 gallon (16 cups) of water. Read labels of cleaning products and follow recommendations provided on product labels. Labels contain instructions for safe and effective use of the cleaning product including precautions you should take when applying the product, such as wearing gloves or eye  protection and making sure you have good ventilation during use of the product. Remove gloves and wash hands immediately after cleaning.  Monitor yourself for signs and symptoms of illness Caregivers and household members are considered close contacts, should monitor their health, and will be asked to limit movement outside of the home to the extent possible. Follow the monitoring steps for close contacts listed on the symptom monitoring form.   ? If you have additional questions, contact your local health department or call the epidemiologist on call at [phone] (available 24/7). ? This guidance is subject to change. For the most up-to-date guidance from [facility],  please refer to their website: TripMetro.hu   Increase activity slowly   Complete by: As directed      Allergies as of 07/15/2019      Reactions   Amlodipine Cough      Medication List    STOP taking these medications   azithromycin 250 MG tablet Commonly known as: ZITHROMAX   valsartan-hydrochlorothiazide 320-12.5 MG tablet Commonly known as: DIOVAN-HCT     TAKE these medications   acetaminophen 325 MG tablet Commonly known as: TYLENOL Take 650 mg by mouth every 6 (six) hours as needed for mild pain or headache.   albuterol 108 (90 Base) MCG/ACT inhaler Commonly known as: VENTOLIN HFA Inhale 2 puffs into the lungs every 6 (six) hours as needed for wheezing or shortness of breath.   allopurinol 300 MG tablet Commonly known as: ZYLOPRIM Take 300 mg by mouth daily.   ascorbic acid 500 MG tablet Commonly known as: VITAMIN C Take 1 tablet (500 mg total) by mouth daily. Start taking on: July 16, 2019   benzonatate 100 MG capsule Commonly known as: Lawyer Take 1 capsule (100 mg total) by mouth every 6 (six) hours as needed for cough.   carvedilol 6.25 MG tablet Commonly known as: Coreg Take 1 tablet (6.25 mg total) by mouth 2 (two)  times daily.   multivitamin with minerals Tabs tablet Take 1 tablet by mouth daily.   predniSONE 10 MG tablet Commonly known as: DELTASONE 30 mg daily for 2 day, 20 mg daily for 2 days,10 mg daily for 1 day, then stop   zinc sulfate 220 (50 Zn) MG capsule Take 1 capsule (220 mg total) by mouth daily. Start taking on: July 16, 2019            Durable Medical Equipment  (From admission, onward)         Start     Ordered   07/12/19 1411  For home use only DME oxygen  Once    Question Answer Comment  Length of Need 6 Months   Mode or (Route) Nasal cannula   Liters per Minute 2   Frequency Continuous (stationary and portable oxygen unit needed)   Oxygen conserving device Yes   Oxygen delivery system Gas      07/12/19 1411         Follow-up Information    [employer] Follow up.   Why: A representative from [employer] will deliver concentrator and oxygen tanks to your home. Should you have any concerns or problems with the tanks please call number listed for [employer].  Contact information: [address] [place] [address] [phone]        Elias Else, MD. Schedule an appointment as soon as possible for a visit in 1 week(s).   Specialty: Family Medicine Contact information: [phone] [address] [place] [address] [phone]        Lyn Records, MD. Schedule an appointment as soon as possible for a visit in 1 month(s).   Specialty: Cardiology Contact information: [address] [place] [address] [phone]          Allergies  Allergen Reactions   Amlodipine Cough    Consultations:   None  Other Procedures/Studies: Dg Chest Port 1 View  Result Date: 07/10/2019 CLINICAL DATA:  Respiratory distress, history of COVID-19 positivity EXAM: PORTABLE CHEST 1 VIEW COMPARISON:  07/08/2019 FINDINGS: Cardiac shadow is stable. Lungs are well aerated bilaterally. Persistent left mid and lower  lung  infiltrate is seen although slightly improved when compared with the prior exam. Patchy changes are noted in the right lung as well. No bony abnormality is seen. IMPRESSION: Bilateral infiltrates although somewhat improved when compared with the prior exam. Electronically Signed   By: Alcide Clever M.D.   On: 07/10/2019 09:35   Dg Chest Port 1 View  Result Date: 07/08/2019 CLINICAL DATA:  Cough, shortness of breath. EXAM: PORTABLE CHEST 1 VIEW COMPARISON:  Radiographs of August 24, 2015. FINDINGS: Stable cardiomediastinal silhouette. No pneumothorax is noted. Mild bilateral perihilar and basilar opacities are noted concerning for atelectasis or infiltrates. Small left pleural effusion may be present. Bony thorax is unremarkable. IMPRESSION: Mild bilateral perihilar and basilar opacities are noted concerning for atelectasis or possibly multifocal pneumonia. Small left pleural effusion is noted. Electronically Signed   By: Lupita Raider M.D.   On: 07/08/2019 11:59   Vas [place] Lower Extremity Venous (dvt)  Result Date: 07/10/2019  Lower Venous Study Indications: Elevated Ddimer.  Risk Factors: COVID 19 positive. Comparison Study: No prior studies. Performing Technologist: Chanda Busing RVT  Examination Guidelines: A complete evaluation includes B-mode imaging, spectral Doppler, color Doppler, and power Doppler as needed of all accessible portions of each vessel. Bilateral testing is considered an integral part of a complete examination. Limited examinations for reoccurring indications may be performed as noted.  +---------+---------------+---------+-----------+----------+--------------+  RIGHT     Compressibility Phasicity Spontaneity Properties Thrombus Aging  +---------+---------------+---------+-----------+----------+--------------+  CFV       Full            Yes       Yes                                    +---------+---------------+---------+-----------+----------+--------------+  SFJ       Full                                                              +---------+---------------+---------+-----------+----------+--------------+  FV Prox   Full                                                             +---------+---------------+---------+-----------+----------+--------------+  FV Mid    Full                                                             +---------+---------------+---------+-----------+----------+--------------+  FV Distal Full                                                             +---------+---------------+---------+-----------+----------+--------------+  PFV       Full                                                             +---------+---------------+---------+-----------+----------+--------------+  POP       Full            Yes       Yes                                    +---------+---------------+---------+-----------+----------+--------------+  PTV       Full                                                             +---------+---------------+---------+-----------+----------+--------------+  PERO      Full                                                             +---------+---------------+---------+-----------+----------+--------------+   +---------+---------------+---------+-----------+----------+--------------+  LEFT      Compressibility Phasicity Spontaneity Properties Thrombus Aging  +---------+---------------+---------+-----------+----------+--------------+  CFV       Full            Yes       Yes                                    +---------+---------------+---------+-----------+----------+--------------+  SFJ       Full                                                             +---------+---------------+---------+-----------+----------+--------------+  FV Prox   Full                                                             +---------+---------------+---------+-----------+----------+--------------+  FV Mid    Full                                                              +---------+---------------+---------+-----------+----------+--------------+  FV Distal Full                                                             +---------+---------------+---------+-----------+----------+--------------+  PFV       Full                                                             +---------+---------------+---------+-----------+----------+--------------+  POP       Full            Yes       Yes                                    +---------+---------------+---------+-----------+----------+--------------+  PTV       Full                                                             +---------+---------------+---------+-----------+----------+--------------+  PERO      Full                                                             +---------+---------------+---------+-----------+----------+--------------+     Summary: Right: There is no evidence of deep vein thrombosis in the lower extremity. No cystic structure found in the popliteal fossa. Left: There is no evidence of deep vein thrombosis in the lower extremity. No cystic structure found in the popliteal fossa.  *See table(s) above for measurements and observations. Electronically signed by Gretta Began MD on 07/10/2019 at 4:25:14 PM.    Final       TODAY-DAY OF DISCHARGE:  Subjective:   Eric [name] today has no headache,no chest abdominal pain,no new weakness tingling or numbness, feels much better wants to go home today.   Objective:   Blood pressure (!) 113/50, pulse 83, temperature 98.5 F (36.9 C), temperature source Oral, resp. rate (!) 21, height 5\' 10"  (1.778 m), weight 94.6 kg, SpO2 96 %.  Intake/Output Summary (Last 24 hours) at 07/15/2019 1044 Last data filed at 07/15/2019 0856 Gross per 24 hour  Intake 840 ml  Output 3280 ml  Net -2440 ml   Filed Weights   07/08/19 1107 07/08/19 2152  Weight: 94.3 kg 94.6 kg    Exam: Awake Alert, Oriented *3, No new F.N deficits, Normal affect [place].AT,PERRAL Supple Neck,No  JVD, No cervical lymphadenopathy appriciated.  Symmetrical Chest wall movement, Good air movement bilaterally, CTAB RRR,No Gallops,Rubs or new Murmurs, No Parasternal Heave +ve B.Sounds, Abd Soft, Non tender, No organomegaly appriciated, No rebound -guarding or rigidity. No Cyanosis, Clubbing or edema, No new Rash or bruise   PERTINENT RADIOLOGIC STUDIES: Dg Chest Port 1 View  Result Date: 07/10/2019 CLINICAL DATA:  Respiratory distress, history of COVID-19 positivity EXAM: PORTABLE CHEST 1 VIEW COMPARISON:  07/08/2019 FINDINGS: Cardiac shadow is stable. Lungs are well aerated bilaterally. Persistent left mid and lower lung infiltrate is seen although slightly improved when compared with the prior exam. Patchy changes are noted in the right lung as well. No bony abnormality is seen. IMPRESSION: Bilateral infiltrates although somewhat improved when compared with the prior exam. Electronically Signed   By: Alcide Clever M.D.   On: 07/10/2019 09:35   Dg Chest Port 1 View  Result Date: 07/08/2019 CLINICAL DATA:  Cough, shortness of breath. EXAM: PORTABLE CHEST 1 VIEW COMPARISON:  Radiographs of August 24, 2015. FINDINGS: Stable cardiomediastinal silhouette. No pneumothorax is noted. Mild bilateral perihilar and basilar opacities are noted concerning  for atelectasis or infiltrates. Small left pleural effusion may be present. Bony thorax is unremarkable. IMPRESSION: Mild bilateral perihilar and basilar opacities are noted concerning for atelectasis or possibly multifocal pneumonia. Small left pleural effusion is noted. Electronically Signed   By: Lupita Raider M.D.   On: 07/08/2019 11:59   Vas [place] Lower Extremity Venous (dvt)  Result Date: 07/10/2019  Lower Venous Study Indications: Elevated Ddimer.  Risk Factors: COVID 19 positive. Comparison Study: No prior studies. Performing Technologist: Chanda Busing RVT  Examination Guidelines: A complete evaluation includes B-mode imaging, spectral Doppler,  color Doppler, and power Doppler as needed of all accessible portions of each vessel. Bilateral testing is considered an integral part of a complete examination. Limited examinations for reoccurring indications may be performed as noted.  +---------+---------------+---------+-----------+----------+--------------+  RIGHT     Compressibility Phasicity Spontaneity Properties Thrombus Aging  +---------+---------------+---------+-----------+----------+--------------+  CFV       Full            Yes       Yes                                    +---------+---------------+---------+-----------+----------+--------------+  SFJ       Full                                                             +---------+---------------+---------+-----------+----------+--------------+  FV Prox   Full                                                             +---------+---------------+---------+-----------+----------+--------------+  FV Mid    Full                                                             +---------+---------------+---------+-----------+----------+--------------+  FV Distal Full                                                             +---------+---------------+---------+-----------+----------+--------------+  PFV       Full                                                             +---------+---------------+---------+-----------+----------+--------------+  POP       Full            Yes       Yes                                    +---------+---------------+---------+-----------+----------+--------------+  PTV       Full                                                             +---------+---------------+---------+-----------+----------+--------------+  PERO      Full                                                             +---------+---------------+---------+-----------+----------+--------------+   +---------+---------------+---------+-----------+----------+--------------+  LEFT       Compressibility Phasicity Spontaneity Properties Thrombus Aging  +---------+---------------+---------+-----------+----------+--------------+  CFV       Full            Yes       Yes                                    +---------+---------------+---------+-----------+----------+--------------+  SFJ       Full                                                             +---------+---------------+---------+-----------+----------+--------------+  FV Prox   Full                                                             +---------+---------------+---------+-----------+----------+--------------+  FV Mid    Full                                                             +---------+---------------+---------+-----------+----------+--------------+  FV Distal Full                                                             +---------+---------------+---------+-----------+----------+--------------+  PFV       Full                                                             +---------+---------------+---------+-----------+----------+--------------+  POP       Full            Yes       Yes                                    +---------+---------------+---------+-----------+----------+--------------+  PTV       Full                                                             +---------+---------------+---------+-----------+----------+--------------+  PERO      Full                                                             +---------+---------------+---------+-----------+----------+--------------+     Summary: Right: There is no evidence of deep vein thrombosis in the lower extremity. No cystic structure found in the popliteal fossa. Left: There is no evidence of deep vein thrombosis in the lower extremity. No cystic structure found in the popliteal fossa.  *See table(s) above for measurements and observations. Electronically signed by Curt Jews MD on 07/10/2019 at 4:25:14 PM.    Final      PERTINENT LAB  RESULTS: CBC: Recent Labs    07/13/19 0134 07/14/19 0350  WBC 14.1* 13.8*  HGB 10.5* 11.2*  HCT 30.8* 32.1*  PLT 623* 697*   CMET CMP     Component Value Date/Time   NA 139 07/15/2019 0915   NA 142 12/09/2016 1643   K 4.4 07/15/2019 0915   CL 106 07/15/2019 0915   CO2 23 07/15/2019 0915   GLUCOSE 99 07/15/2019 0915   BUN 66 (H) 07/15/2019 0915   BUN 24 12/09/2016 1643   CREATININE 2.04 (H) 07/15/2019 0915   CREATININE 1.57 (H) 11/07/2016 1634   CALCIUM 9.3 07/15/2019 0915   PROT 6.8 07/15/2019 0915   ALBUMIN 3.0 (L) 07/15/2019 0915   AST 31 07/15/2019 0915   ALT 69 (H) 07/15/2019 0915   ALKPHOS 63 07/15/2019 0915   BILITOT 0.9 07/15/2019 0915   GFRNONAA 34 (L) 07/15/2019 0915   GFRAA 39 (L) 07/15/2019 0915    GFR Estimated Creatinine Clearance: 43.3 mL/min (A) (by C-G formula based on SCr of 2.04 mg/dL (H)). No results for input(s): LIPASE, AMYLASE in the last 72 hours. No results for input(s): CKTOTAL, CKMB, CKMBINDEX, TROPONINI in the last 72 hours. Invalid input(s): POCBNP Recent Labs    07/13/19 0134  DDIMER 2.58*   No results for input(s): HGBA1C in the last 72 hours. No results for input(s): CHOL, HDL, LDLCALC, TRIG, CHOLHDL, LDLDIRECT in the last 72 hours. No results for input(s): TSH, T4TOTAL, T3FREE, THYROIDAB in the last 72 hours.  Invalid input(s): [place]    07/13/19 0134 07/14/19 0350  FERRITIN 696* 739*   Coags: No results for input(s): INR in the last 72 hours.  Invalid input(s): PT Microbiology: Recent Results (from the past 240 hour(s))  SARS Coronavirus 2 [facility] order, Performed in [facility] hospital lab) Nasopharyngeal Nasopharyngeal Swab     Status: Abnormal   Collection Time: 07/08/19 11:22 AM   Specimen: Nasopharyngeal Swab  Result Value Ref Range Status   SARS Coronavirus 2 POSITIVE (A) NEGATIVE Final    Comment: RESULT CALLED TO, READ BACK BY AND VERIFIED WITH: Grant Fontana. RN @1311  07/08/2019 BY NMCCOY   (NOTE) If result is NEGATIVE SARS-CoV-2 target nucleic acids are NOT DETECTED. The SARS-CoV-2 RNA is  generally detectable in upper and lower  respiratory specimens during the acute phase of infection. The lowest  concentration of SARS-CoV-2 viral copies this assay can detect is 250  copies / mL. A negative result does not preclude SARS-CoV-2 infection  and should not be used as the sole basis for treatment or other  patient management decisions.  A negative result may occur with  improper specimen collection / handling, submission of specimen other  than nasopharyngeal swab, presence of viral mutation(s) within the  areas targeted by this assay, and inadequate number of viral copies  (<250 copies / mL). A negative result must be combined with clinical  observations, patient history, and epidemiological information. If result is POSITIVE SARS-CoV-2 target nucleic acids are DETECT ED. The SARS-CoV-2 RNA is generally detectable in upper and lower  respiratory specimens during the acute phase of infection.  Positive  results are indicative of active infection with SARS-CoV-2.  Clinical  correlation with patient history and other diagnostic information is  necessary to determine patient infection status.  Positive results do  not rule out bacterial infection or co-infection with other viruses. If result is PRESUMPTIVE POSTIVE SARS-CoV-2 nucleic acids MAY BE PRESENT.   A presumptive positive result was obtained on the submitted specimen  and confirmed on repeat testing.  While 2019 novel coronavirus  (SARS-CoV-2) nucleic acids may be present in the submitted sample  additional confirmatory testing may be necessary for epidemiological  and / or clinical management purposes  to differentiate between  SARS-CoV-2 and other Sarbecovirus currently known to infect humans.  If clinically indicated additional testing with an alternate test  methodology (LAB74 53) is advised. The SARS-CoV-2 RNA is  generally  detectable in upper and lower respiratory specimens during the acute  phase of infection. The expected result is Negative. Fact Sheet for Patients:  BoilerBrush.com.cyhttps://www.fda.gov/media/136312/download Fact Sheet for Healthcare Providers: https://pope.com/https://www.fda.gov/media/136313/download This test is not yet approved or cleared by the [place] FDA and has been authorized for detection and/or diagnosis of SARS-CoV-2 by FDA under an Emergency Use Authorization (EUA).  This EUA will remain in effect (meaning this test can be used) for the duration of the COVID-19 declaration under Section 564(b)(1) of the Act, 21 U.S.C. section 360bbb-3(b)(1), unless the authorization is terminated or revoked sooner. Performed at [facility], [address] [place], [place] [address]   Blood Culture (routine x 2)     Status: None   Collection Time: 07/08/19 11:40 AM   Specimen: BLOOD  Result Value Ref Range Status   Specimen Description   Final    BLOOD LEFT ANTECUBITAL Performed at [facility], [address] [place], [place] [address]    Special Requests   Final    BOTTLES DRAWN AEROBIC AND ANAEROBIC Blood Culture adequate volume Performed at [facility], [address] [place], [place] [address]    Culture   Final    NO GROWTH 5 DAYS Performed at [facility], [address] [place], [place] [address]    Report Status 07/13/2019 FINAL  Final  Blood Culture (routine x 2)     Status: None   Collection Time: 07/08/19 11:45 AM   Specimen: BLOOD  Result Value Ref Range Status   Specimen Description   Final    BLOOD RIGHT HAND Performed at [facility], [address] [place], [place] [address]    Special Requests   Final    BOTTLES DRAWN AEROBIC AND ANAEROBIC  Blood Culture adequate volume Performed at [facility], [address], [place], [place] [address]    Culture   Final    NO  GROWTH 5 DAYS Performed at [facility], [address], [place], [place] [address]    Report Status 07/13/2019 FINAL  Final  Culture, sputum-assessment     Status: None   Collection Time: 07/09/19  7:50 AM   Specimen: Sputum  Result Value Ref Range Status   Specimen Description SPUTUM  Final   Special Requests NONE  Final   Sputum evaluation   Final    THIS SPECIMEN IS ACCEPTABLE FOR SPUTUM CULTURE Performed at [facility], [address] [place], [place] [address]    Report Status 07/09/2019 FINAL  Final  Culture, respiratory     Status: None   Collection Time: 07/09/19  7:50 AM   Specimen: SPU  Result Value Ref Range Status   Specimen Description   Final    SPUTUM Performed at [facility], [address] [place], [place] [address]    Special Requests   Final    NONE Reflexed from [id number] Performed at [facility], [address] [place], [place] [address]    Gram Stain NO WBC SEEN FEW GRAM VARIABLE ROD   Final   Culture   Final    Consistent with normal respiratory flora. Performed at [facility], [address], [place], [place] [address]    Report Status 07/12/2019 FINAL  Final    FURTHER DISCHARGE INSTRUCTIONS:  Get Medicines reviewed and adjusted: Please take all your medications with you for your next visit with your Primary MD  Laboratory/radiological data: Please request your Primary MD to go over all hospital tests and procedure/radiological results at the follow up, please ask your Primary MD to get all Hospital records sent to his/her office.  In some cases, they will be blood work, cultures and biopsy results pending at the time of your discharge. Please request that your primary care M.D. goes through all the records of your hospital data and follows up on these results.  Also Note the following: If you experience worsening of your admission symptoms, develop shortness of breath,  life threatening emergency, suicidal or homicidal thoughts you must seek medical attention immediately by calling 911 or calling your MD immediately  if symptoms less severe.  You must read complete instructions/literature along with all the possible adverse reactions/side effects for all the Medicines you take and that have been prescribed to you. Take any new Medicines after you have completely understood and accpet all the possible adverse reactions/side effects.   Do not drive when taking Pain medications or sleeping medications (Benzodaizepines)  Do not take more than prescribed Pain, Sleep and Anxiety Medications. It is not advisable to combine anxiety,sleep and pain medications without talking with your primary care practitioner  Special Instructions: If you have smoked or chewed Tobacco  in the last 2 yrs please stop smoking, stop any regular Alcohol  and or any Recreational drug use.  Wear Seat belts while driving.  Please note: You were cared for by a hospitalist during your hospital stay. Once you are discharged, your primary care physician will handle any further medical issues. Please note that NO REFILLS for any discharge medications will be authorized once you are discharged, as it is imperative that you return to your primary care physician (or establish a relationship with a primary care physician if  you do not have one) for your post hospital discharge needs so that they can reassess your need for medications and monitor your lab values.  Total Time spent coordinating discharge including counseling, education and face to face time equals 35 minutes.  SignedJeoffrey Massed 07/15/2019 10:44 AM

## 2019-07-15 NOTE — Progress Notes (Signed)
Reviewed AVS with patient, Pt verbalized understanding. Reviewed proper uses of portable o2 tank. And uses of pulse ox.

## 2019-07-15 NOTE — Discharge Instructions (Signed)
Person Under Monitoring Name: Eric [name]  Location: [place] [address]   Infection Prevention Recommendations for Individuals Confirmed to have, or Being Evaluated for, 2019 Novel Coronavirus (COVID-19) Infection Who Receive Care at Home  Individuals who are confirmed to have, or are being evaluated for, COVID-19 should follow the prevention steps below until a healthcare provider or local or state health department says they can return to normal activities.  Stay home except to get medical care You should restrict activities outside your home, except for getting medical care. Do not go to work, school, or public areas, and do not use public transportation or taxis.  Call ahead before visiting your doctor Before your medical appointment, call the healthcare provider and tell them that you have, or are being evaluated for, COVID-19 infection. This will help the healthcare providers office take steps to keep other people from getting infected. Ask your healthcare provider to call the local or state health department.  Monitor your symptoms Seek prompt medical attention if your illness is worsening (e.g., difficulty breathing). Before going to your medical appointment, call the healthcare provider and tell them that you have, or are being evaluated for, COVID-19 infection. Ask your healthcare provider to call the local or state health department.  Wear a facemask You should wear a facemask that covers your nose and mouth when you are in the same room with other people and when you visit a healthcare provider. People who live with or visit you should also wear a facemask while they are in the same room with you.  Separate yourself from other people in your home As much as possible, you should stay in a different room from other people in your home. Also, you should use a separate bathroom, if available.  Avoid sharing household items You should not share  dishes, drinking glasses, cups, eating utensils, towels, bedding, or other items with other people in your home. After using these items, you should wash them thoroughly with soap and water.  Cover your coughs and sneezes Cover your mouth and nose with a tissue when you cough or sneeze, or you can cough or sneeze into your sleeve. Throw used tissues in a lined trash can, and immediately wash your hands with soap and water for at least 20 seconds or use an alcohol-based hand rub.  Wash your [facility] your hands often and thoroughly with soap and water for at least 20 seconds. You can use an alcohol-based hand sanitizer if soap and water are not available and if your hands are not visibly dirty. Avoid touching your eyes, nose, and mouth with unwashed hands.   Prevention Steps for Caregivers and Household Members of Individuals Confirmed to have, or Being Evaluated for, COVID-19 Infection Being Cared for in the Home  If you live with, or provide care at home for, a person confirmed to have, or being evaluated for, COVID-19 infection please follow these guidelines to prevent infection:  Follow healthcare providers instructions Make sure that you understand and can help the patient follow any healthcare provider instructions for all care.  Provide for the patients basic needs You should help the patient with basic needs in the home and provide support for getting groceries, prescriptions, and other personal needs.  Monitor the patients symptoms If they are getting sicker, call his or her medical provider and tell them that the patient has, or is being evaluated for, COVID-19 infection. This will help the healthcare providers office  take steps to keep other people from getting infected. Ask the healthcare provider to call the local or state health department.  Limit the number of people who have contact with the patient  If possible, have only one caregiver for the patient.  Other  household members should stay in another home or place of residence. If this is not possible, they should stay  in another room, or be separated from the patient as much as possible. Use a separate bathroom, if available.  Restrict visitors who do not have an essential need to be in the home.  Keep older adults, very young children, and other sick people away from the patient Keep older adults, very young children, and those who have compromised immune systems or chronic health conditions away from the patient. This includes people with chronic heart, lung, or kidney conditions, diabetes, and cancer.  Ensure good ventilation Make sure that shared spaces in the home have good air flow, such as from an air conditioner or an opened window, weather permitting.  Wash your hands often  Wash your hands often and thoroughly with soap and water for at least 20 seconds. You can use an alcohol based hand sanitizer if soap and water are not available and if your hands are not visibly dirty.  Avoid touching your eyes, nose, and mouth with unwashed hands.  Use disposable paper towels to dry your hands. If not available, use dedicated cloth towels and replace them when they become wet.  Wear a facemask and gloves  Wear a disposable facemask at all times in the room and gloves when you touch or have contact with the patients blood, body fluids, and/or secretions or excretions, such as sweat, saliva, sputum, nasal mucus, vomit, urine, or feces.  Ensure the mask fits over your nose and mouth tightly, and do not touch it during use.  Throw out disposable facemasks and gloves after using them. Do not reuse.  Wash your hands immediately after removing your facemask and gloves.  If your personal clothing becomes contaminated, carefully remove clothing and launder. Wash your hands after handling contaminated clothing.  Place all used disposable facemasks, gloves, and other waste in a lined container before  disposing them with other household waste.  Remove gloves and wash your hands immediately after handling these items.  Do not share dishes, glasses, or other household items with the patient  Avoid sharing household items. You should not share dishes, drinking glasses, cups, eating utensils, towels, bedding, or other items with a patient who is confirmed to have, or being evaluated for, COVID-19 infection.  After the person uses these items, you should wash them thoroughly with soap and water.  Wash laundry thoroughly  Immediately remove and wash clothes or bedding that have blood, body fluids, and/or secretions or excretions, such as sweat, saliva, sputum, nasal mucus, vomit, urine, or feces, on them.  Wear gloves when handling laundry from the patient.  Read and follow directions on labels of laundry or clothing items and detergent. In general, wash and dry with the warmest temperatures recommended on the label.  Clean all areas the individual has used often  Clean all touchable surfaces, such as counters, tabletops, doorknobs, bathroom fixtures, toilets, phones, keyboards, tablets, and bedside tables, every day. Also, clean any surfaces that may have blood, body fluids, and/or secretions or excretions on them.  Wear gloves when cleaning surfaces the patient has come in contact with.  Use a diluted bleach solution (e.g., dilute bleach with 1 part  bleach and 10 parts water) or a household disinfectant with a label that says EPA-registered for coronaviruses. To make a bleach solution at home, add 1 tablespoon of bleach to 1 quart (4 cups) of water. For a larger supply, add  cup of bleach to 1 gallon (16 cups) of water.  Read labels of cleaning products and follow recommendations provided on product labels. Labels contain instructions for safe and effective use of the cleaning product including precautions you should take when applying the product, such as wearing gloves or eye protection  and making sure you have good ventilation during use of the product.  Remove gloves and wash hands immediately after cleaning.  Monitor yourself for signs and symptoms of illness Caregivers and household members are considered close contacts, should monitor their health, and will be asked to limit movement outside of the home to the extent possible. Follow the monitoring steps for close contacts listed on the symptom monitoring form.   ? If you have additional questions, contact your local health department or call the epidemiologist on call at [phone] (available 24/7). ? This guidance is subject to change. For the most up-to-date guidance from [facility], please refer to their website: YouBlogs.pl

## 2019-07-15 NOTE — TOC Transition Note (Signed)
Transition of Care [facility]) - CM/SW Discharge Note   Patient Details  Name: Eric [name] MRN: [id number] Date of Birth: [birth year]  Transition of [facility]) CM/SW Contact:  Ninfa Meeker, RN Phone Number: [phone] (working remotely) 07/15/2019, 10:49 AM  Is improving  Clinical Narrative:   63 yr old gentleman admitted and treated with COVID 19. Thankfully patient is improving and will discharge home. Case manager spoke with patient's wife concerning need for oxygen at discharge. Choice for agency offered. Referral was called to [facility]ding, [place]. Concentrator and tanks will be delivered to patient's home. CM contacted [facility] and requested portable tank and pulse ox be delivered to patient's room for transport home.   Final next level of care: Home/Self Care Barriers to Discharge: No Barriers Identified   Patient Goals and CMS Choice   CMS Medicare.gov Compare Post Acute Care list provided to:: Patient Represenative (must comment)(wife) Choice offered to / list presented to : Spouse  Discharge Placement                       Discharge Plan and Services In-house Referral: NA Discharge Planning Services: CM Consult Post Acute Care Choice: Durable Medical Equipment          DME Arranged: Oxygen DME Agency: [place] Date DME Agency Contacted: 07/15/19 Time DME Agency Contacted: [id number] Representative spoke with at DME Agency: Learta Codding [facility] Arranged: NA          Social Determinants of Health ([place]) Interventions     Readmission Risk Interventions No flowsheet data found.

## 2019-07-19 ENCOUNTER — Telehealth: Payer: Self-pay | Admitting: Interventional Cardiology

## 2019-07-19 NOTE — Telephone Encounter (Signed)
Spoke with pt and moved appt out to October.  Advised to call if any issues.  Pt verbalized understanding.

## 2019-07-19 NOTE — Telephone Encounter (Signed)
New Message   Patient calling in stating that he was just discharged from the hospital on yesterday from having COVID-19. Patient is calling in to see if he is still able to come to his appointment with Dr. Tamala Julian on 07/24/19. Please give patient a call back to confirm.

## 2019-07-24 ENCOUNTER — Ambulatory Visit: Payer: Managed Care, Other (non HMO) | Admitting: Interventional Cardiology

## 2019-08-26 NOTE — Progress Notes (Signed)
Cardiology Office Note:    Date:  08/27/2019   ID:  Eric, [name] May 30, 1956, MRN [id number]  PCP:  Elias Else, MD  Cardiologist:  Lesleigh Noe, MD   Referring MD: Elias Else, MD   Chief Complaint  Patient presents with  . Congestive Heart Failure  . Cardiac Valve Problem    Aortic regurgitation    History of Present Illness:    Eric [name] is a 63 y.o. male with a hx of significant aortic regurgitation, hypertension, left ventricular hypertrophy, and chronic kidney disease stage III. Recent Covid 19 infection.  The patient had COVID-19 in August.  He had pneumonia.  He was in the hospital 7 days.  His angiotensin receptor therapy was switched to beta-blocker therapy.  Carvedilol is the beta-blocker that was chosen.  The ARB was stopped because the patient's BUN and creatinine increased while sick in the hospital.  He has chronic CKD with creatinine levels usually running around 1.5.  The most recent creatinine in September was 1.86 with a potassium of 4.7 off ARB therapy and on carvedilol.  This is very similar to the last creatinine that was done while hospitalized in late August.  He feels well.  He denies shortness of breath.  No orthopnea, PND, lower extremity swelling, exertional fatigue, chest discomfort, or extended palpitations.  Past Medical History:  Diagnosis Date  . Allergy    ALLERGIC RHINITIS..WORSE SPRING/FALL  . Aortic valve regurgitation 07/10/14   ECHO @ [facility]  . GERD (gastroesophageal reflux disease)    CURRENTLY DIET CONTROLLED  . Hypertension   . Sinusitis, chronic     Past Surgical History:  Procedure Laterality Date  . KNEE ARTHROSCOPY Bilateral     Current Medications: Current Meds  Medication Sig  . acetaminophen (TYLENOL) 325 MG tablet Take 650 mg by mouth every 6 (six) hours as needed for mild pain or headache.  . allopurinol (ZYLOPRIM) 300 MG tablet Take 300 mg by mouth daily.  . carvedilol (COREG) 6.25 MG tablet  Take 1 tablet (6.25 mg total) by mouth 2 (two) times daily.  . Ferrous Sulfate (IRON) 325 (65 Fe) MG TABS Take 1 tablet by mouth daily.  . vitamin C (VITAMIN C) 500 MG tablet Take 1 tablet (500 mg total) by mouth daily.     Allergies:   Amlodipine   Social History   Socioeconomic History  . Marital status: Married    Spouse name: Eric [name]  . Number of children: 3  . Years of education: 56  . Highest education level: Master's degree (e.g., MA, MS, MEng, MEd, MSW, MBA)  Occupational History  . Occupation: Art gallery manager  Social Needs  . Financial resource strain: Not hard at all  . Food insecurity    Worry: Never true    Inability: Never true  . Transportation needs    Medical: No    Non-medical: No  Tobacco Use  . Smoking status: Never Smoker  . Smokeless tobacco: Never Used  Substance and Sexual Activity  . Alcohol use: Yes    Alcohol/week: 0.0 standard drinks    Comment: occasional  . Drug use: No  . Sexual activity: Yes  Lifestyle  . Physical activity    Days per week: 3 days    Minutes per session: 60 min  . Stress: Not at all  Relationships  . Social Musician on phone: Three times a week    Gets together: Three times a week  Attends religious service: More than 4 times per year    Active member of club or organization: Yes    Attends meetings of clubs or organizations: 1 to 4 times per year    Relationship status: Married  Other Topics Concern  . Not on file  Social History Narrative  . Not on file     Family History: The patient's family history includes Cancer in his brother, father, and maternal grandmother; Diabetes in his brother and daughter; Heart disease in his maternal grandmother; Hypertension in his mother.  ROS:   Please see the history of present illness.    Feels better and stronger.  Appetite is been stable.  All other systems reviewed and are negative.  EKGs/Labs/Other Studies Reviewed:    The following studies were reviewed today:  No new imaging data.  Last echo was done a year ago.  EKG:  EKG was performed on 07/08/2019 when the patient was hospitalized with COVID 19 infection and demonstrated 1, biatrial abnormality, and prominent voltage.  Recent Labs: 07/08/2019: B Natriuretic Peptide 69.1 07/10/2019: Magnesium 2.6 07/14/2019: Hemoglobin 11.2; Platelets 697 07/15/2019: ALT 69; BUN 66; Creatinine, Ser 2.04; Potassium 4.4; Sodium 139  Recent Lipid Panel    Component Value Date/Time   TRIG 98 07/10/2019 0215    Physical Exam:    VS:  BP 122/64   Pulse 68   Ht 5\' 10"  (1.778 m)   Wt 211 lb 6.4 oz (95.9 kg)   SpO2 97%   BMI 30.33 kg/m     Wt Readings from Last 3 Encounters:  08/27/19 211 lb 6.4 oz (95.9 kg)  07/08/19 208 lb 8.9 oz (94.6 kg)  10/08/18 208 lb 6.4 oz (94.5 kg)     GEN: Abdominal obesity. No acute distress HEENT: Normal NECK: No JVD. LYMPHATICS: No lymphadenopathy CARDIAC: There is a 3/6 to 4/6 decrescendo holodiastolic murmur of aortic regurgitation.  RRR there is also a 2/6 systolic right upper sternal border murmur.  No gallop, or edema. VASCULAR:  Normal Pulses. No bruits. RESPIRATORY:  Clear to auscultation without rales, wheezing or rhonchi  ABDOMEN: Soft, non-tender, non-distended, No pulsatile mass, MUSCULOSKELETAL: No deformity  SKIN: Warm and dry NEUROLOGIC:  Alert and oriented x 3 PSYCHIATRIC:  Normal affect   ASSESSMENT:    1. Nonrheumatic aortic valve insufficiency   2. 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 Hyperlipidemia with target LDL less than 70   4. COVID-19 virus infection   5. Educated about COVID-19 virus infection    PLAN:    In order of problems listed above:  1. ARB therapy in combination with diuretics has been switched to beta-blocker therapy.  This occurred while he was hospitalized with COVID 19 because of kidney impairment.  Creatinine got as high as 2.2.  He feels fine on carvedilol.  It has theoretical  disadvantages and that it causes slow heart rate and no longer diastolic filling phase of the LV from regurgitation.  This will enhance the possibility of LV enlargement.  We discussed the parameters required for valve replacement.  End-diastolic dimension less than 45 and LV end-systolic index or more sensitive.  When his echo was repeated for this year, we will pay close attention.  He understands that at some point he will need to have aortic valve replacement. 2. Blood pressures well controlled on the current medication regimen, hence no change in therapy. 3. LDL cholesterol is extremely elevated.  He needs medical therapy  for this.  He will need to be on a statin.  He wants to try medical therapy first. 4.     Medication Adjustments/Labs and Tests Ordered: Current medicines are reviewed at length with the patient today.  Concerns regarding medicines are outlined above.  Orders Placed This Encounter  Procedures  . ECHOCARDIOGRAM COMPLETE   No orders of the defined types were placed in this encounter.   Patient Instructions  Medication Instructions:  Your physician recommends that you continue on your current medications as directed. Please refer to the Current Medication list given to you today.  If you need a refill on your cardiac medications before your next appointment, please call your pharmacy.   Lab work: None If you have labs (blood work) drawn today and your tests are completely normal, you will receive your results only by: Marland Kitchen MyChart Message (if you have MyChart) OR . A paper copy in the mail If you have any lab test that is abnormal or we need to change your treatment, we will call you to review the results.  Testing/Procedures: Your physician has requested that you have an echocardiogram. Echocardiography is a painless test that uses sound waves to create images of your heart. It provides your doctor with information about the size and shape of your heart and how well  your heart's chambers and valves are working. This procedure takes approximately one hour. There are no restrictions for this procedure.   Follow-Up: At [facility], you and your health needs are our priority.  As part of our continuing mission to provide you with exceptional heart care, we have created designated Provider Care Teams.  These Care Teams include your primary Cardiologist (physician) and Advanced Practice Providers (APPs -  Physician Assistants and Nurse Practitioners) who all work together to provide you with the care you need, when you need it. You will need a follow up appointment in 12 months.  Please call our office 2 months in advance to schedule this appointment.  You may see Eric Grooms, MD or one of the following Advanced Practice Providers on your designated Care Team:   Truitt Merle, NP Cecilie Kicks, NP . Kathyrn Drown, NP  Any Other Special Instructions Will Be Listed Below (If Applicable).       Signed, Eric Grooms, MD  08/27/2019 3:15 PM    [facility]

## 2019-08-27 ENCOUNTER — Other Ambulatory Visit: Payer: Self-pay

## 2019-08-27 ENCOUNTER — Encounter: Payer: Self-pay | Admitting: Interventional Cardiology

## 2019-08-27 ENCOUNTER — Encounter (INDEPENDENT_AMBULATORY_CARE_PROVIDER_SITE_OTHER): Payer: Self-pay

## 2019-08-27 ENCOUNTER — Ambulatory Visit (INDEPENDENT_AMBULATORY_CARE_PROVIDER_SITE_OTHER): Payer: Managed Care, Other (non HMO) | Admitting: Interventional Cardiology

## 2019-08-27 VITALS — BP 122/64 | HR 68 | Ht 70.0 in | Wt 211.4 lb

## 2019-08-27 DIAGNOSIS — E785 Hyperlipidemia, unspecified: Secondary | ICD-10-CM | POA: Diagnosis not present

## 2019-08-27 DIAGNOSIS — Z7189 Other specified counseling: Secondary | ICD-10-CM

## 2019-08-27 DIAGNOSIS — I351 Nonrheumatic aortic (valve) insufficiency: Secondary | ICD-10-CM | POA: Diagnosis not present

## 2019-08-27 DIAGNOSIS — N183 Chronic kidney disease, stage 3 unspecified: Secondary | ICD-10-CM

## 2019-08-27 DIAGNOSIS — I130 Hypertensive heart and chronic kidney disease with heart failure and stage 1 through stage 4 chronic kidney disease, or unspecified chronic kidney disease: Secondary | ICD-10-CM

## 2019-08-27 NOTE — Patient Instructions (Signed)
Medication Instructions:  Your physician recommends that you continue on your current medications as directed. Please refer to the Current Medication list given to you today.  If you need a refill on your cardiac medications before your next appointment, please call your pharmacy.   Lab work: None If you have labs (blood work) drawn today and your tests are completely normal, you will receive your results only by: . MyChart Message (if you have MyChart) OR . A paper copy in the mail If you have any lab test that is abnormal or we need to change your treatment, we will call you to review the results.  Testing/Procedures: Your physician has requested that you have an echocardiogram. Echocardiography is a painless test that uses sound waves to create images of your heart. It provides your doctor with information about the size and shape of your heart and how well your heart's chambers and valves are working. This procedure takes approximately one hour. There are no restrictions for this procedure.   Follow-Up: At [facility], you and your health needs are our priority.  As part of our continuing mission to provide you with exceptional heart care, we have created designated Provider Care Teams.  These Care Teams include your primary Cardiologist (physician) and Advanced Practice Providers (APPs -  Physician Assistants and Nurse Practitioners) who all work together to provide you with the care you need, when you need it. You will need a follow up appointment in 12 months.  Please call our office 2 months in advance to schedule this appointment.  You may see [name], MD or one of the following Advanced Practice Providers on your designated Care Team:   Lori Gerhardt, NP Laura Ingold, NP . Jill McDaniel, NP  Any Other Special Instructions Will Be Listed Below (If Applicable).    

## 2019-08-30 ENCOUNTER — Ambulatory Visit (HOSPITAL_COMMUNITY): Payer: Managed Care, Other (non HMO) | Attending: Cardiology

## 2019-08-30 ENCOUNTER — Other Ambulatory Visit: Payer: Self-pay

## 2019-08-30 ENCOUNTER — Encounter (INDEPENDENT_AMBULATORY_CARE_PROVIDER_SITE_OTHER): Payer: Self-pay

## 2019-08-30 DIAGNOSIS — I351 Nonrheumatic aortic (valve) insufficiency: Secondary | ICD-10-CM | POA: Insufficient documentation

## 2019-08-31 ENCOUNTER — Telehealth: Payer: Self-pay | Admitting: Interventional Cardiology

## 2019-08-31 NOTE — Telephone Encounter (Signed)
ECHOCARDIOGRAPHIC f/u of AR and LVESD and ESDi:  08/2017: 32 mm and 14.9 mm/M2  08/2018: 37.3 mm and 17.4 mm/M2  08/2019: 43 mm and 20.1 mm/M2  LVESDi > 20 mm/M2 who have surgery get better outcomes and those using traditional >25 mm/M2 Eric [name] at [facility] / [place] ([employer] 06/2018)

## 2019-09-04 ENCOUNTER — Encounter: Payer: Self-pay | Admitting: *Deleted

## 2019-09-04 NOTE — Telephone Encounter (Signed)
This encounter was created in error - please disregard.

## 2019-09-05 ENCOUNTER — Telehealth: Payer: Self-pay | Admitting: Interventional Cardiology

## 2019-09-05 NOTE — Telephone Encounter (Signed)
Patient would like to switch his appt he has schedule tomorrow 10/16 to a virtual visit.

## 2019-09-05 NOTE — Telephone Encounter (Signed)
Confirmed with Dr. Tamala Julian ok to do virtual visit.  Spoke with Eric [name] and switched appt.  Went over consent and confirmed Eric [name] able to do video visit.  Advised to check vitals in the morning and CMA would call and go over meds and get vitals at that time.  Eric [name] appreciative for call.      Virtual Visit Pre-Appointment Phone Call  "(Name), I am calling you today to discuss your upcoming appointment. We are currently trying to limit exposure to the virus that causes COVID-19 by seeing patients at home rather than in the office."  1. "What is the BEST phone number to call the day of the visit?" - include this in appointment notes  2. "Do you have or have access to (through a family member/friend) a smartphone with video capability that we can use for your visit?" a. If yes - list this number in appt notes as "cell" (if different from BEST phone #) and list the appointment type as a VIDEO visit in appointment notes b. If no - list the appointment type as a PHONE visit in appointment notes  3. Confirm consent - "In the setting of the current Covid19 crisis, you are scheduled for a (phone or video) visit with your provider on (date) at (time).  Just as we do with many in-office visits, in order for you to participate in this visit, we must obtain consent.  If you'd like, I can send this to your mychart (if signed up) or email for you to review.  Otherwise, I can obtain your verbal consent now.  All virtual visits are billed to your insurance company just like a normal visit would be.  By agreeing to a virtual visit, we'd like you to understand that the technology does not allow for your provider to perform an examination, and thus may limit your provider's ability to fully assess your condition. If your provider identifies any concerns that need to be evaluated in person, we will make arrangements to do so.  Finally, though the technology is pretty good, we cannot assure that it will always work on either your or our  end, and in the setting of a video visit, we may have to convert it to a phone-only visit.  In either situation, we cannot ensure that we have a secure connection.  Are you willing to proceed?" STAFF: Did the patient verbally acknowledge consent to telehealth visit? Document YES/NO here: YES  4. Advise patient to be prepared - "Two hours prior to your appointment, go ahead and check your blood pressure, pulse, oxygen saturation, and your weight (if you have the equipment to check those) and write them all down. When your visit starts, your provider will ask you for this information. If you have an Apple Watch or Kardia device, please plan to have heart rate information ready on the day of your appointment. Please have a pen and paper handy nearby the day of the visit as well."  5. Give patient instructions for MyChart download to smartphone OR Doximity/Doxy.me as below if video visit (depending on what platform provider is using)  6. Inform patient they will receive a phone call 15 minutes prior to their appointment time (may be from unknown caller ID) so they should be prepared to answer    TELEPHONE CALL NOTE  YOUCEF KLAS has been deemed a candidate for a follow-up tele-health visit to limit community exposure during the Covid-19 pandemic. I spoke with the patient via phone to ensure  availability of phone/video source, confirm preferred email & phone number, and discuss instructions and expectations.  I reminded Eric [name] to be prepared with any vital sign and/or heart rhythm information that could potentially be obtained via home monitoring, at the time of his visit. I reminded [name] to expect a phone call prior to his visit.  Julio Sicks, RN 09/05/2019 12:41 PM   INSTRUCTIONS FOR DOWNLOADING THE MYCHART APP TO SMARTPHONE  - The patient must first make sure to have activated MyChart and know their login information - If Apple, go to [employer] and type in MyChart in the  search bar and download the app. If Android, ask patient to go to [employer] and type in [place] in the search bar and download the app. The app is free but as with any other app downloads, their phone may require them to verify saved payment information or Apple/Android password.  - The patient will need to then log into the app with their MyChart username and password, and select [facility] as their healthcare provider to link the account. When it is time for your visit, go to the MyChart app, find appointments, and click Begin Video Visit. Be sure to Select Allow for your device to access the Microphone and Camera for your visit. You will then be connected, and your provider will be with you shortly.  **If they have any issues connecting, or need assistance please contact MyChart service desk [phone])**  **If using a computer, in order to ensure the best quality for their visit they will need to use either of the following Internet Browsers: [employer] or Google Chrome**  IF USING DOXIMITY or DOXY.ME - The patient will receive a link just prior to their visit by text.     FULL LENGTH CONSENT FOR TELE-HEALTH VISIT   I hereby voluntarily request, consent and authorize [facility] and its employed or contracted physicians, physician assistants, nurse practitioners or other licensed health care professionals (the Practitioner), to provide me with telemedicine health care services (the "Services") as deemed necessary by the treating Practitioner. I acknowledge and consent to receive the Services by the Practitioner via telemedicine. I understand that the telemedicine visit will involve communicating with the Practitioner through live audiovisual communication technology and the disclosure of certain medical information by electronic transmission. I acknowledge that I have been given the opportunity to request an in-person assessment or other available alternative prior  to the telemedicine visit and am voluntarily participating in the telemedicine visit.  I understand that I have the right to withhold or withdraw my consent to the use of telemedicine in the course of my care at any time, without affecting my right to future care or treatment, and that the Practitioner or I may terminate the telemedicine visit at any time. I understand that I have the right to inspect all information obtained and/or recorded in the course of the telemedicine visit and may receive copies of available information for a reasonable fee.  I understand that some of the potential risks of receiving the Services via telemedicine include:  Marland Kitchen Delay or interruption in medical evaluation due to technological equipment failure or disruption; . Information transmitted may not be sufficient (e.g. poor resolution of images) to allow for appropriate medical decision making by the Practitioner; and/or  . In rare instances, security protocols could fail, causing a breach of personal health information.  Furthermore, I acknowledge that it is my responsibility to provide  information about my medical history, conditions and care that is complete and accurate to the best of my ability. I acknowledge that Practitioner's advice, recommendations, and/or decision may be based on factors not within their control, such as incomplete or inaccurate data provided by me or distortions of diagnostic images or specimens that may result from electronic transmissions. I understand that the practice of medicine is not an exact science and that Practitioner makes no warranties or guarantees regarding treatment outcomes. I acknowledge that I will receive a copy of this consent concurrently upon execution via email to the email address I last provided but may also request a printed copy by calling the office of [facility].    I understand that my insurance will be billed for this visit.   I have read or had this consent read  to me. . I understand the contents of this consent, which adequately explains the benefits and risks of the Services being provided via telemedicine.  . I have been provided ample opportunity to ask questions regarding this consent and the Services and have had my questions answered to my satisfaction. . I give my informed consent for the services to be provided through the use of telemedicine in my medical care  By participating in this telemedicine visit I agree to the above.

## 2019-09-06 ENCOUNTER — Encounter: Payer: Self-pay | Admitting: Interventional Cardiology

## 2019-09-06 ENCOUNTER — Other Ambulatory Visit: Payer: Self-pay

## 2019-09-06 ENCOUNTER — Telehealth (INDEPENDENT_AMBULATORY_CARE_PROVIDER_SITE_OTHER): Payer: Managed Care, Other (non HMO) | Admitting: Interventional Cardiology

## 2019-09-06 VITALS — BP 146/67 | HR 98 | Ht 70.0 in | Wt 209.0 lb

## 2019-09-06 DIAGNOSIS — Z7189 Other specified counseling: Secondary | ICD-10-CM

## 2019-09-06 DIAGNOSIS — I351 Nonrheumatic aortic (valve) insufficiency: Secondary | ICD-10-CM

## 2019-09-06 DIAGNOSIS — I10 Essential (primary) hypertension: Secondary | ICD-10-CM

## 2019-09-06 DIAGNOSIS — E785 Hyperlipidemia, unspecified: Secondary | ICD-10-CM

## 2019-09-06 DIAGNOSIS — I130 Hypertensive heart and chronic kidney disease with heart failure and stage 1 through stage 4 chronic kidney disease, or unspecified chronic kidney disease: Secondary | ICD-10-CM

## 2019-09-06 MED ORDER — CARVEDILOL 3.125 MG PO TABS: 3.1250 mg | ORAL_TABLET | Freq: Two times a day (BID) | ORAL | 3 refills | Status: DC

## 2019-09-06 MED ORDER — HYDROCHLOROTHIAZIDE 25 MG PO TABS: 12.5000 mg | ORAL_TABLET | Freq: Every day | ORAL | 3 refills | Status: DC

## 2019-09-06 MED ORDER — ROSUVASTATIN CALCIUM 20 MG PO TABS: 20.0000 mg | ORAL_TABLET | Freq: Every day | ORAL | 3 refills | Status: DC

## 2019-09-06 NOTE — Progress Notes (Signed)
Virtual Visit via Video Note   This visit type was conducted due to national recommendations for restrictions regarding the COVID-19 Pandemic (e.g. social distancing) in an effort to limit this patient's exposure and mitigate transmission in our community.  Due to his co-morbid illnesses, this patient is at least at moderate risk for complications without adequate follow up.  This format is felt to be most appropriate for this patient at this time.  All issues noted in this document were discussed and addressed.  A limited physical exam was performed with this format.  Please refer to the patient's chart for his consent to telehealth [facility].   Date:  09/06/2019   ID:  Eric GuarneriJerome J [name], DOB 02-05-1956, MRN [id number]  Patient Location: Home Provider Location: Office  PCP:  Elias Elseeade, Robert, MD  Cardiologist:  Lesleigh NoeHenry W Smith III, MD  Electrophysiologist:  None   Evaluation Performed:  Follow-Up Visit  Chief Complaint:  Aortic valve disease  History of Present Illness:    Eric PostJerome J [name] is a 63 y.o. male with  significant aortic regurgitation,hypertension, left ventricular hypertrophy, and chronic kidney disease stage III. Recent Covid 19 infection.  Eric FentonJerome is doing well.  Today on this virtual visit we are joined by his wife.  This visit is being done because of growing LV end-systolic dimension size.  He has aortic regurgitation related to a bicuspid aortic valve.  He is asymptomatic.  In 2018 the end-systolic diameter was 32 mm, 2019 was 37.3 mm, and currently 43 mm.  The LV end-systolic dimension indexes 20.1 mm/m.  The [facility] metric [facility] [place] is considering surgical repair when the end stop end-systolic dimension index is greater than 20 mm/m.  We discussed this.  A significant issue in this particular case is that the patient was placed on beta-blocker therapy when hospitalized with Covid 8 weeks ago.  This will slow his heart rate and could be associated  with an increase in systolic volume/diameter.  We discussed making an alteration in his medication regimen and doing a follow-up echocardiogram in 6 months.  We also had extensive conversation concerning primary prevention of vascular events.  His LDL cholesterol is 141.  He has been resistant to starting therapy for elevated lipids.  The patient does not have symptoms concerning for COVID-19 infection (fever, chills, cough, or new shortness of breath).    Past Medical History:  Diagnosis Date  . Allergy    ALLERGIC RHINITIS..WORSE SPRING/FALL  . Aortic valve regurgitation 07/10/14   ECHO @ [facility]  . GERD (gastroesophageal reflux disease)    CURRENTLY DIET CONTROLLED  . Hypertension   . Sinusitis, chronic    Past Surgical History:  Procedure Laterality Date  . KNEE ARTHROSCOPY Bilateral      Current Meds  Medication Sig  . acetaminophen (TYLENOL) 325 MG tablet Take 650 mg by mouth every 6 (six) hours as needed for mild pain or headache.  . allopurinol (ZYLOPRIM) 300 MG tablet Take 300 mg by mouth daily.  . carvedilol (COREG) 6.25 MG tablet Take 1 tablet (6.25 mg total) by mouth 2 (two) times daily.  . cetirizine (ZYRTEC) 10 MG tablet Take 10 mg by mouth as needed for allergies.  . Ferrous Sulfate (IRON) 325 (65 Fe) MG TABS Take 1 tablet by mouth daily.  . vitamin C (VITAMIN C) 500 MG tablet Take 1 tablet (500 mg total) by mouth daily.     Allergies:   Amlodipine   Social History  Tobacco Use  . Smoking status: Never Smoker  . Smokeless tobacco: Never Used  Substance Use Topics  . Alcohol use: Yes    Alcohol/week: 0.0 standard drinks    Comment: occasional  . Drug use: No     Family Hx: The patient's family history includes Cancer in his brother, father, and maternal grandmother; Diabetes in his brother and daughter; Heart disease in his maternal grandmother; Hypertension in his mother.  ROS:   Please see the history of present illness.    No complaints.  All other systems reviewed and are negative.   Prior CV studies:   The following studies were reviewed today:  2D Doppler echocardiogram performed 08/30/2019: IMPRESSIONS    1. Left ventricular ejection fraction, by visual estimation, is 55 to 60%. The left ventricle has normal function. Moderately increased left ventricular size. There is no left ventricular hypertrophy.  2. Left ventricular diastolic Doppler parameters are indeterminate pattern of LV diastolic filling.  3. Global right ventricle has normal systolic function.The right ventricular size is normal. No increase in right ventricular wall thickness.  4. Left atrial size was normal.  5. Right atrial size was mild-moderately dilated.  6. The mitral valve is normal in structure. Mild mitral valve regurgitation. No evidence of mitral stenosis.  7. The tricuspid valve is normal in structure. Tricuspid valve regurgitation is mild.  8. The aortic valve is tricuspid Aortic valve regurgitation is severe by color flow Doppler. Structurally normal aortic valve, with no evidence of sclerosis or stenosis.  9. The pulmonic valve was normal in structure. Pulmonic valve regurgitation is trivial by color flow Doppler. 10. Aortic dilatation noted. 11. There is mild to moderate dilatation of the ascending aorta measuring 40 mm. 12. Mildly elevated pulmonary artery systolic pressure. 13. The inferior vena cava is normal in size with greater than 50% respiratory variability, suggesting right atrial pressure of 3 mmHg. 14. LV EDD and ESD more dilated than prior study.  Labs/Other Tests and Data Reviewed:    EKG:  No ECG reviewed.  Recent Labs: 07/08/2019: B Natriuretic Peptide 69.1 07/10/2019: Magnesium 2.6 07/14/2019: Hemoglobin 11.2; Platelets 697 07/15/2019: ALT 69; BUN 66; Creatinine, Ser 2.04; Potassium 4.4; Sodium 139   Recent Lipid Panel Lab Results  Component Value Date/Time   TRIG 98 07/10/2019 02:15 AM    Wt Readings from Last  3 Encounters:  09/06/19 209 lb (94.8 kg)  08/27/19 211 lb 6.4 oz (95.9 kg)  07/08/19 208 lb 8.9 oz (94.6 kg)     Objective:    Vital Signs:  BP (!) 146/67   Pulse 98   Ht 5\' 10"  (1.778 m)   Wt 209 lb (94.8 kg)   BMI 29.99 kg/m    VITAL SIGNS:  reviewed GEN:  no acute distress EYES:  sclerae anicteric, EOMI - Extraocular Movements Intact RESPIRATORY:  normal respiratory effort, symmetric expansion CARDIOVASCULAR:  no peripheral edema  ASSESSMENT & PLAN:    1. Nonrheumatic aortic valve insufficiency   2. 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 Hyperlipidemia with target LDL less than 70   4. Essential hypertension   5. Educated about COVID-19 virus infection    PLAN:  1. Decrease carvedilol to 3.125 mg twice daily.  Add HCTZ 12.5 mg/day.  Bmet in 7 to 10 days.  We will add amlodipine if blood pressure control is not adequate.  Target blood pressure less than 140 mmHg systolic.  ED Doppler echocardiogram will be repeated  in 6 months. 2. Monitor blood pressure at home and report results. 3. Start rosuvastatin 20 mg/day.  Target LDL less than 70. 4. Blood pressure range less than 465 mmHg systolic 5. The patient has had COVID-19.  He is still practicing social distancing.  Mask wearing.  And handwashing.  Plan clinical follow-up in 6 months after his next echo.  He will monitor blood pressure at home with changes that are noted above.  He needs a 6 to 8-week liver and lipid panel.  COVID-19 Education: The signs and symptoms of COVID-19 were discussed with the patient and how to seek care for testing (follow up with PCP or arrange E-visit).  The importance of social distancing was discussed today.  Time:   Today, I have spent 25 minutes with the patient with telehealth technology discussing the above problems.     Medication Adjustments/Labs and Tests Ordered: Current medicines are reviewed at length with the patient today.  Concerns regarding  medicines are outlined above.   Tests Ordered: No orders of the defined types were placed in this encounter.   Medication Changes: No orders of the defined types were placed in this encounter.   Follow Up:  In Person in 6 month(s)  Signed, Sinclair Grooms, MD  09/06/2019 10:40 AM    [place]

## 2019-09-20 ENCOUNTER — Other Ambulatory Visit: Payer: Managed Care, Other (non HMO) | Admitting: *Deleted

## 2019-09-20 ENCOUNTER — Other Ambulatory Visit: Payer: Self-pay

## 2019-09-20 DIAGNOSIS — I10 Essential (primary) hypertension: Secondary | ICD-10-CM

## 2019-09-20 DIAGNOSIS — I351 Nonrheumatic aortic (valve) insufficiency: Secondary | ICD-10-CM

## 2019-09-20 DIAGNOSIS — I130 Hypertensive heart and chronic kidney disease with heart failure and stage 1 through stage 4 chronic kidney disease, or unspecified chronic kidney disease: Secondary | ICD-10-CM

## 2019-09-20 LAB — BASIC METABOLIC PANEL
BUN/Creatinine Ratio: 13 (ref 10–24)
BUN: 25 mg/dL (ref 8–27)
CO2: 22 mmol/L (ref 20–29)
Calcium: 9.8 mg/dL (ref 8.6–10.2)
Chloride: 99 mmol/L (ref 96–106)
Creatinine, Ser: 1.86 mg/dL — ABNORMAL HIGH (ref 0.76–1.27)
GFR calc Af Amer: 44 mL/min/{1.73_m2} — ABNORMAL LOW (ref >59)
GFR calc non Af Amer: 38 mL/min/{1.73_m2} — ABNORMAL LOW (ref >59)
Glucose: 96 mg/dL (ref 65–99)
Potassium: 3.9 mmol/L (ref 3.5–5.2)
Sodium: 138 mmol/L (ref 134–144)

## 2019-11-08 ENCOUNTER — Other Ambulatory Visit: Payer: Self-pay

## 2019-11-08 ENCOUNTER — Other Ambulatory Visit: Payer: Managed Care, Other (non HMO) | Admitting: *Deleted

## 2019-11-08 DIAGNOSIS — E785 Hyperlipidemia, unspecified: Secondary | ICD-10-CM

## 2019-11-08 LAB — LIPID PANEL
Chol/HDL Ratio: 2.5 ratio (ref 0.0–5.0)
Cholesterol, Total: 142 mg/dL (ref 100–199)
HDL: 56 mg/dL (ref >39)
LDL Chol Calc (NIH): 72 mg/dL (ref 0–99)
Triglycerides: 73 mg/dL (ref 0–149)
VLDL Cholesterol Cal: 14 mg/dL (ref 5–40)

## 2019-11-08 LAB — HEPATIC FUNCTION PANEL
ALT: 27 IU/L (ref 0–44)
AST: 27 IU/L (ref 0–40)
Albumin: 4.5 g/dL (ref 3.8–4.8)
Alkaline Phosphatase: 71 IU/L (ref 39–117)
Bilirubin Total: 0.6 mg/dL (ref 0.0–1.2)
Bilirubin, Direct: 0.15 mg/dL (ref 0.00–0.40)
Total Protein: 6.6 g/dL (ref 6.0–8.5)

## 2019-11-11 ENCOUNTER — Telehealth: Payer: Self-pay | Admitting: *Deleted

## 2019-11-11 DIAGNOSIS — E785 Hyperlipidemia, unspecified: Secondary | ICD-10-CM

## 2019-11-11 NOTE — Telephone Encounter (Signed)
Pt has been notified of lab results by phone with verbal understanding. Pt agreeable to repeat labs in 6 months 05/15/20, FLP/LFT.Marland Kitchen Patient notified of result. Please refer to phone note from today for complete details.  Julaine Hua, [facility] 11/11/2019 10:42 AM

## 2020-01-30 ENCOUNTER — Ambulatory Visit: Payer: Managed Care, Other (non HMO) | Attending: Orthopedic Surgery | Admitting: Physical Therapy

## 2020-01-30 ENCOUNTER — Other Ambulatory Visit: Payer: Self-pay

## 2020-01-30 ENCOUNTER — Encounter: Payer: Self-pay | Admitting: Physical Therapy

## 2020-01-30 DIAGNOSIS — M25511 Pain in right shoulder: Secondary | ICD-10-CM | POA: Insufficient documentation

## 2020-01-30 DIAGNOSIS — M25611 Stiffness of right shoulder, not elsewhere classified: Secondary | ICD-10-CM | POA: Diagnosis present

## 2020-01-30 NOTE — Therapy (Signed)
[facility] [address] [place], [place], [address] Phone: [phone]   Fax:  [phone]  Physical Therapy Evaluation  Patient Details  Name: Eric [name] MRN: [id number] Date of Birth: Jan 07, 1956 No data recorded  Encounter Date: 01/30/2020  PT End of Session - 01/30/20 0833    Visit Number  1    Date for PT Re-Evaluation  03/31/20    PT Start Time  0750    PT Stop Time  0834    PT Time Calculation (min)  44 min    Activity Tolerance  Patient tolerated treatment well    Behavior During Therapy  [facility] for tasks assessed/performed       Past Medical History:  Diagnosis Date  . Allergy    ALLERGIC RHINITIS..WORSE SPRING/FALL  . Aortic valve regurgitation 07/10/14   ECHO @ [facility]  . GERD (gastroesophageal reflux disease)    CURRENTLY DIET CONTROLLED  . Hypertension   . Sinusitis, chronic     Past Surgical History:  Procedure Laterality Date  . KNEE ARTHROSCOPY Bilateral     There were no vitals filed for this visit.   Subjective Assessment - 01/30/20 0754    Subjective  Patient presents with right shoulder pain.  He reports that a few motnhs ago he started with pain and also pain at rest.  He does report that over the past week he has had some less pain.  He was reporting some difficulty with ADL's.  MD note does report a possible RC tear    Limitations  Lifting;House hold activities    Diagnostic tests  x-ray arthritis    Patient Stated Goals  have less pain better motions    Currently in Pain?  Yes    Pain Score  0-No pain    Pain Location  Shoulder    Pain Orientation  Right    Pain Descriptors / Indicators  Aching    Pain Type  Acute pain    Pain Radiating Towards  denies    Pain Onset  More than a month ago    Pain Frequency  Constant    Aggravating Factors   reports pain at rest, reaching pain up to 8/10    Pain Relieving Factors  reports that when it was hurting there really was  nothing that helped the pain    Effect of Pain on Daily Activities  difficulty with ADL's         [facility] PT Assessment - 01/30/20 0001      Assessment   Medical Diagnosis  right shoulder pain    Referring Provider (PT)  Duwayne Heck  (Pended)     Onset Date/Surgical Date  01/02/20  (Pended)     Hand Dominance  Right  (Pended)       Precautions   Precautions  None  (Pended)       Balance Screen   Has the patient fallen in the past 6 months  No  (Pended)     Has the patient had a decrease in activity level because of a fear of falling?   No  (Pended)     Is the patient reluctant to leave their home because of a fear of falling?   No  (Pended)       Home Environment   Additional Comments  does some light yardworl  (Pended)       Prior Function   Level of Independence  Independent  (Pended)  Vocation  Full time employment  Market researcher)     Gaffer  on computer  Market researcher)     Leisure  no exercise  (Pended)       Posture/Postural Control   Posture Comments  fwd head, rounded shoulders  (Pended)       ROM / Strength   AROM / PROM / Strength  AROM;Strength  (Pended)       AROM   AROM Assessment Site  Shoulder  (Pended)     Right/Left Shoulder  Right  (Pended)     Right Shoulder Flexion  160 Degrees  (Pended)     Right Shoulder ABduction  165 Degrees  (Pended)     Right Shoulder Internal Rotation  30 Degrees  (Pended)     Right Shoulder External Rotation  80 Degrees  (Pended)       Strength   Overall Strength Comments  right shoulder flexion/abduction 4/5, ER 4-/5 with light pain, IR 4+/5  (Pended)       Palpation   Palpation comment  non tender  (Pended)       Special Tests   Other special tests  + empty can test, some pain iwth impingement  (Pended)                 Objective measurements completed on examination: See above findings.              PT Education - 01/30/20 [phone]    Education Details  scapular stabilization, IR stretch     Person(s) Educated  Patient    Methods  Explanation;Demonstration;Tactile cues;Verbal cues;Handout    Comprehension  Verbalized understanding;Verbal cues required;Tactile cues required;Returned demonstration          PT Long Term Goals - 01/30/20 0837      PT LONG TERM GOAL #1   Title  understand posture and body mechnaics    Time  8    Period  Weeks    Status  New      PT LONG TERM GOAL #2   Title  decrease pain 50%    Time  8    Period  Weeks    Status  New      PT LONG TERM GOAL #3   Title  increase IR ROM to 70 degrees    Time  8    Period  Weeks    Status  New      PT LONG TERM GOAL #4   Title  increase ER strength to 4+/5    Time  8    Period  Weeks    Status  New             Plan - 01/30/20 9150    Clinical Impression Statement  Patient reports a few months of significant right shoulder pain, MD note says RC tear.  He is weak with ER and has a positive empty can test, all ROM was WFL's except IR was 30 degrees with pain, he reports that over the past two weeks his pain has been better.  He has very poor posture.  I really advised him on the mechanics of the shoulder and how it can affect the space for the RC mms    Stability/Clinical Decision Making  Stable/Uncomplicated    Clinical Decision Making  Low    Rehab Potential  Good    PT Frequency  1x / week    PT Duration  8 weeks    PT Treatment/Interventions  ADLs/Self Care Home Management;Cryotherapy;Electrical Stimulation;Iontophoresis 4mg /ml Dexamethasone;Moist Heat;Ultrasound;Therapeutic activities;Therapeutic exercise;Manual techniques;Patient/family education    PT Next Visit Plan  Could try ionto, really needs posture and body mechanics    Consulted and Agree with Plan of Care  Patient       Patient will benefit from skilled therapeutic intervention in order to improve the following deficits and impairments:  Improper body mechanics, Pain, Postural dysfunction, Decreased range of motion, Decreased  strength, Impaired UE functional use, Impaired flexibility  Visit Diagnosis: Acute pain of right shoulder - Plan: PT plan of care cert/re-cert  Stiffness of right shoulder, not elsewhere classified - Plan: PT plan of care cert/re-cert     Problem List Patient Active Problem List   Diagnosis Date Noted  . Acute respiratory failure with hypoxia ([place]) 07/09/2019  . CKD (chronic kidney disease), stage III 07/09/2019  . AKI (acute kidney injury) ([place]) 07/09/2019  . COVID-19 virus infection 07/08/2019  . Essential hypertension 08/31/2017  . 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 GERD (gastroesophageal reflux disease)   . Allergy   . Sinusitis, chronic   . Aortic valve regurgitation 07/10/2014    Sumner Boast., PT 01/30/2020, 8:39 AM  [place] [address] [place], [place], [address] Phone: [phone]   Fax:  [phone]  Name: NELL SCHRACK MRN: [id number] Date of Birth: 07-11-1956

## 2020-02-13 ENCOUNTER — Ambulatory Visit: Payer: Managed Care, Other (non HMO) | Admitting: Physical Therapy

## 2020-02-28 ENCOUNTER — Other Ambulatory Visit: Payer: Self-pay

## 2020-02-28 ENCOUNTER — Ambulatory Visit (HOSPITAL_COMMUNITY): Payer: Managed Care, Other (non HMO) | Attending: Cardiology

## 2020-02-28 DIAGNOSIS — I351 Nonrheumatic aortic (valve) insufficiency: Secondary | ICD-10-CM | POA: Diagnosis present

## 2020-03-03 NOTE — Progress Notes (Signed)
Cardiology Office Note:    Date:  03/04/2020   ID:  THORNTON DOHRMANN, DOB [birth year], MRN [id number]  PCP:  Elias Else, MD  Cardiologist:  Lesleigh Noe, MD   Referring MD: Elias Else, MD   Chief Complaint  Patient presents with  . Cardiac Valve Problem    Aortic regurgitation    History of Present Illness:    Eric [name] is a 64 y.o. male with a hx of severe aortic regurgitation,hypertension, left ventricular hypertrophy, and chronic kidney disease stage III.Recent Covid 19 infection (September 2020)  He feels well.  He does not have shortness of breath, exertional intolerance, orthopnea, lower extremity swelling, chest pain, syncope, near syncope, or any cardiac complaint.  He is here for follow-up 46-month echo after October echo demonstrated LV enlargement in the setting of severe aortic regurgitation.  Past Medical History:  Diagnosis Date  . Allergy    ALLERGIC RHINITIS..WORSE SPRING/FALL  . Aortic valve regurgitation 07/10/14   ECHO @ [facility]  . GERD (gastroesophageal reflux disease)    CURRENTLY DIET CONTROLLED  . Hypertension   . Sinusitis, chronic     Past Surgical History:  Procedure Laterality Date  . KNEE ARTHROSCOPY Bilateral     Current Medications: Current Meds  Medication Sig  . acetaminophen (TYLENOL) 325 MG tablet Take 650 mg by mouth every 6 (six) hours as needed for mild pain or headache.  . allopurinol (ZYLOPRIM) 300 MG tablet Take 300 mg by mouth daily.  . carvedilol (COREG) 3.125 MG tablet Take 1 tablet (3.125 mg total) by mouth 2 (two) times daily.  . cetirizine (ZYRTEC) 10 MG tablet Take 10 mg by mouth as needed for allergies.  . Ferrous Sulfate (IRON) 325 (65 Fe) MG TABS Take 1 tablet by mouth daily.  . hydrochlorothiazide (HYDRODIURIL) 25 MG tablet Take 0.5 tablets (12.5 mg total) by mouth daily.  . rosuvastatin (CRESTOR) 20 MG tablet Take 1 tablet (20 mg total) by mouth daily.  . vitamin C (VITAMIN C) 500 MG tablet Take 1  tablet (500 mg total) by mouth daily.     Allergies:   Amlodipine   Social History   Socioeconomic History  . Marital status: Married    Spouse name: [name]  . Number of children: 3  . Years of education: 32  . Highest education level: Master's degree (e.g., MA, MS, MEng, MEd, MSW, MBA)  Occupational History  . Occupation: Art gallery manager  Tobacco Use  . Smoking status: Never Smoker  . Smokeless tobacco: Never Used  Substance and Sexual Activity  . Alcohol use: Yes    Alcohol/week: 0.0 standard drinks    Comment: occasional  . Drug use: No  . Sexual activity: Yes  Other Topics Concern  . Not on file  Social History Narrative  . Not on file   Social Determinants of Health   Financial Resource Strain: Low Risk   . Difficulty of Paying Living Expenses: Not hard at all  Food Insecurity: No Food Insecurity  . Worried About Programme researcher, broadcasting/film/video in the Last Year: Never true  . Ran Out of Food in the Last Year: Never true  Transportation Needs: No Transportation Needs  . Lack of Transportation (Medical): No  . Lack of Transportation (Non-Medical): No  Physical Activity: Sufficiently Active  . Days of Exercise per Week: 3 days  . Minutes of Exercise per Session: 60 min  Stress: No Stress Concern Present  . Feeling of Stress : Not at all  Social Connections: Not Isolated  . Frequency of Communication with Friends and Family: Three times a week  . Frequency of Social Gatherings with Friends and Family: Three times a week  . Attends Religious Services: More than 4 times per year  . Active Member of Clubs or Organizations: Yes  . Attends Banker Meetings: 1 to 4 times per year  . Marital Status: Married     Family History: The patient's family history includes Cancer in his brother, father, and maternal grandmother; Diabetes in his brother and daughter; Heart disease in his maternal grandmother; Hypertension in his mother.  ROS:   Please see the history of present  illness.    No data or complaints other than fatigue.  This is characterized by feeling sluggish and just wanting to sit down and not do much.  Easy to fall asleep.  All other systems reviewed and are negative.  EKGs/Labs/Other Studies Reviewed:    The following studies were reviewed today: 2D Doppler echocardiogram April 2021: IMPRESSIONS    1. Left ventricular ejection fraction, by estimation, is 55 to 60%. The  left ventricle has normal function. The left ventricle has no regional  wall motion abnormalities. The left ventricular internal cavity size was  mildly dilated. LVEDD 33mm, LVESD  40mm (LVESDi 16 mm/m^2). There is moderate asymmetric left ventricular  hypertrophy of the basal-septal segment. Left ventricular diastolic  parameters were normal.  2. Right ventricular systolic function is normal. The right ventricular  size is normal. Tricuspid regurgitation signal is inadequate for assessing  PA pressure.  3. The mitral valve is abnormal. Trivial mitral valve regurgitation.  4. The aortic valve was not well visualized. Aortic valve regurgitation  is severe. No aortic stenosis is present.  5. Aortic dilatation noted. There is mild dilatation of the ascending  aorta measuring 38 mm.  6. The inferior vena cava is normal in size with greater than 50%  respiratory variability, suggesting right atrial pressure of 3 mmHg.    ECHOCARDIOGRAPHIC f/u of AR and LVESD and ESDi:  08/2017: 32 mm and 14.9 mm/M2  08/2018: 37.3 mm and 17.4 mm/M2  08/2019: 43 mm and 20.1 mm/M2  02/28/20: 35.2 mm and 16.55 mm/m2  LVESDi > 20 mm/M2 who have surgery get better outcomes and those using traditional >25 mm/M2 ([facility] / [facility] [place] ([employer] 06/2018)   EKG:  EKG not repeated  Recent Labs: 07/08/2019: B Natriuretic Peptide 69.1 07/10/2019: Magnesium 2.6 07/14/2019: Hemoglobin 11.2; Platelets 697 09/20/2019: BUN 25; Creatinine, Ser 1.86; Potassium 3.9; Sodium  138 11/08/2019: ALT 27  Recent Lipid Panel    Component Value Date/Time   CHOL 142 11/08/2019 0832   TRIG 73 11/08/2019 0832   HDL 56 11/08/2019 0832   CHOLHDL 2.5 11/08/2019 0832   LDLCALC 72 11/08/2019 0832    Physical Exam:    VS:  BP (!) 124/54   Pulse 80   Ht 5\' 10"  (1.778 m)   Wt 220 lb 6.4 oz (100 kg)   SpO2 96%   BMI 31.62 kg/m     Wt Readings from Last 3 Encounters:  03/04/20 220 lb 6.4 oz (100 kg)  09/06/19 209 lb (94.8 kg)  08/27/19 211 lb 6.4 oz (95.9 kg)     GEN: Mildly obese. No acute distress HEENT: Normal NECK: No JVD. LYMPHATICS: No lymphadenopathy CARDIAC:  RRR with 1/6 to 2/6 systolic and 3/6 to 4/6 decrescendo diastolic murmur of aortic regurgitation.  No gallop, or edema. VASCULAR:  Normal Pulses. No bruits. RESPIRATORY:  Clear to auscultation without rales, wheezing or rhonchi  ABDOMEN: Soft, non-tender, non-distended, No pulsatile mass, MUSCULOSKELETAL: No deformity  SKIN: Warm and dry NEUROLOGIC:  Alert and oriented x 3 PSYCHIATRIC:  Normal affect   ASSESSMENT:    1. Nonrheumatic aortic valve insufficiency   2. Hyperlipidemia with target LDL less than 70   3. Essential hypertension   4. Educated about COVID-19 virus infection    PLAN:    In order of problems listed above:  1. Ragan is doing well.  He had repeat echo done 6 months after the October study which showed an increase in LV end-systolic dimension.  Echo on this occasion is back to the baseline that have been present in 2019.  Of note is that the patient was getting over COVID-19 infection in October and perhaps some of the increase in LV cavity size was related to myocardial involvement as a residual impact of COVID-19.  With today's information, my impression is that we should remain conservative in our recommendations concerning surgery.  Also there is the possibility that a device will be developed which will allow percutaneous therapy for the aortic regurgitation rather than  sternotomy or minithoracotomy approach.  In absence of symptoms and with improvement in LV cavity size we will continue watchful waiting.  6 months from now an echocardiogram will be done and I will see him shortly thereafter.  LV end systolic diameter index is 16.5 mm/meter square 2. LDL target should be less than 70.  Continue Crestor 20 mg/day 3. Blood pressure control is excellent.  Continue carvedilol 3.125 mg twice daily and HCTZ 12.5 mg/day 4. COVID-19 vaccine is advocated.  He has had COVID-19 infection.  Social distancing and masking is being observed.   Medication Adjustments/Labs and Tests Ordered: Current medicines are reviewed at length with the patient today.  Concerns regarding medicines are outlined above.  Orders Placed This Encounter  Procedures  . ECHOCARDIOGRAM COMPLETE   No orders of the defined types were placed in this encounter.   Patient Instructions  Medication Instructions:  Your physician recommends that you continue on your current medications as directed. Please refer to the Current Medication list given to you today.  *If you need a refill on your cardiac medications before your next appointment, please call your pharmacy*   Lab Work: None If you have labs (blood work) drawn today and your tests are completely normal, you will receive your results only by: Marland Kitchen MyChart Message (if you have MyChart) OR . A paper copy in the mail If you have any lab test that is abnormal or we need to change your treatment, we will call you to review the results.   Testing/Procedures: Your physician has requested that you have an echocardiogram 1-2 weeks prior to seeing Dr Katrinka Blazing back in 6 months. Echocardiography is a painless test that uses sound waves to create images of your heart. It provides your doctor with information about the size and shape of your heart and how well your heart's chambers and valves are working. This procedure takes approximately one hour. There are  no restrictions for this procedure.    Follow-Up: At [facility], you and your health needs are our priority.  As part of our continuing mission to provide you with exceptional heart care, we have created designated Provider Care Teams.  These Care Teams include your primary Cardiologist (physician) and Advanced Practice Providers (APPs -  Physician Assistants and Nurse Practitioners) who all  work together to provide you with the care you need, when you need it.  We recommend signing up for the patient portal called "MyChart".  Sign up information is provided on this After Visit Summary.  MyChart is used to connect with patients for Virtual Visits (Telemedicine).  Patients are able to view lab/test results, encounter notes, upcoming appointments, etc.  Non-urgent messages can be sent to your provider as well.   To learn more about what you can do with MyChart, go to [email or web address].    Your next appointment:   6 month(s)  The format for your next appointment:   In Person  Provider:   You may see Sinclair Grooms, MD or one of the following Advanced Practice Providers on your designated Care Team:    Truitt Merle, NP  Cecilie Kicks, NP  Kathyrn Drown, NP    Other Instructions      Signed, Sinclair Grooms, MD  03/04/2020 5:07 PM    [place]

## 2020-03-04 ENCOUNTER — Encounter: Payer: Self-pay | Admitting: Interventional Cardiology

## 2020-03-04 ENCOUNTER — Other Ambulatory Visit: Payer: Self-pay

## 2020-03-04 ENCOUNTER — Ambulatory Visit (INDEPENDENT_AMBULATORY_CARE_PROVIDER_SITE_OTHER): Payer: Managed Care, Other (non HMO) | Admitting: Interventional Cardiology

## 2020-03-04 VITALS — BP 124/54 | HR 80 | Ht 70.0 in | Wt 220.4 lb

## 2020-03-04 DIAGNOSIS — Z7189 Other specified counseling: Secondary | ICD-10-CM | POA: Diagnosis not present

## 2020-03-04 DIAGNOSIS — I351 Nonrheumatic aortic (valve) insufficiency: Secondary | ICD-10-CM

## 2020-03-04 DIAGNOSIS — E785 Hyperlipidemia, unspecified: Secondary | ICD-10-CM | POA: Diagnosis not present

## 2020-03-04 DIAGNOSIS — I10 Essential (primary) hypertension: Secondary | ICD-10-CM | POA: Diagnosis not present

## 2020-03-04 NOTE — Patient Instructions (Signed)
Medication Instructions:  Your physician recommends that you continue on your current medications as directed. Please refer to the Current Medication list given to you today.  *If you need a refill on your cardiac medications before your next appointment, please call your pharmacy*   Lab Work: None If you have labs (blood work) drawn today and your tests are completely normal, you will receive your results only by: . MyChart Message (if you have MyChart) OR . A paper copy in the mail If you have any lab test that is abnormal or we need to change your treatment, we will call you to review the results.   Testing/Procedures: Your physician has requested that you have an echocardiogram 1-2 weeks prior to seeing Dr. Smith back in 6 months. Echocardiography is a painless test that uses sound waves to create images of your heart. It provides your doctor with information about the size and shape of your heart and how well your heart's chambers and valves are working. This procedure takes approximately one hour. There are no restrictions for this procedure.    Follow-Up: At [facility], you and your health needs are our priority.  As part of our continuing mission to provide you with exceptional heart care, we have created designated Provider Care Teams.  These Care Teams include your primary Cardiologist (physician) and Advanced Practice Providers (APPs -  Physician Assistants and Nurse Practitioners) who all work together to provide you with the care you need, when you need it.  We recommend signing up for the patient portal called "MyChart".  Sign up information is provided on this After Visit Summary.  MyChart is used to connect with patients for Virtual Visits (Telemedicine).  Patients are able to view lab/test results, encounter notes, upcoming appointments, etc.  Non-urgent messages can be sent to your provider as well.   To learn more about what you can do with MyChart, go to  [email or web address].    Your next appointment:   6 month(s)  The format for your next appointment:   In Person  Provider:   You may see [name], MD or one of the following Advanced Practice Providers on your designated Care Team:    Lori Gerhardt, NP  Laura Ingold, NP  Jill McDaniel, NP    Other Instructions   

## 2020-05-15 ENCOUNTER — Other Ambulatory Visit: Payer: Managed Care, Other (non HMO) | Admitting: *Deleted

## 2020-05-15 ENCOUNTER — Other Ambulatory Visit: Payer: Self-pay

## 2020-05-15 DIAGNOSIS — E785 Hyperlipidemia, unspecified: Secondary | ICD-10-CM

## 2020-05-15 LAB — HEPATIC FUNCTION PANEL
ALT: 30 IU/L (ref 0–44)
AST: 24 IU/L (ref 0–40)
Albumin: 4.4 g/dL (ref 3.8–4.8)
Alkaline Phosphatase: 80 IU/L (ref 48–121)
Bilirubin Total: 0.8 mg/dL (ref 0.0–1.2)
Bilirubin, Direct: 0.21 mg/dL (ref 0.00–0.40)
Total Protein: 6.8 g/dL (ref 6.0–8.5)

## 2020-05-15 LAB — LIPID PANEL
Chol/HDL Ratio: 2.6 ratio (ref 0.0–5.0)
Cholesterol, Total: 144 mg/dL (ref 100–199)
HDL: 55 mg/dL (ref >39)
LDL Chol Calc (NIH): 77 mg/dL (ref 0–99)
Triglycerides: 58 mg/dL (ref 0–149)
VLDL Cholesterol Cal: 12 mg/dL (ref 5–40)

## 2020-05-18 ENCOUNTER — Telehealth: Payer: Self-pay | Admitting: *Deleted

## 2020-05-18 MED ORDER — ROSUVASTATIN CALCIUM 10 MG PO TABS
10.0000 mg | ORAL_TABLET | Freq: Every day | ORAL | 3 refills | Status: DC
Start: 2020-05-18 — End: 2021-04-30

## 2020-05-18 NOTE — Telephone Encounter (Signed)
Okay to reduce the dose of Crestor to 10 mg/day

## 2020-05-18 NOTE — Telephone Encounter (Signed)
Pt has been notified of lab results by phone with verbal understanding. Pt states he wanted to let Dr. Katrinka Blazing know that he really doesn't like taking medications and that he has been for the last few months cutting his Crestor in 1/2. He would like to know if Dr. Katrinka Blazing is ok with this and if so then he would like a new Rx sent in for the smaller dosage. I assured the pt that I will send my notes to Dr. Michaelle Copas nurse to d/w MD further and she will call back with recommendations. Pt thanked me for the call and the help. Patient notified of result.  Please refer to phone note from today for complete details.   Danielle Rankin, [facility] 05/18/2020 9:36 AM

## 2020-05-18 NOTE — Telephone Encounter (Signed)
Spoke with pt and made him aware ok to decrease Rosuvastatin to 10mg  daily.  Pt verbalized understanding and was appreciative for call.

## 2020-06-24 ENCOUNTER — Other Ambulatory Visit: Payer: Self-pay | Admitting: Nephrology

## 2020-06-24 DIAGNOSIS — N1832 Chronic kidney disease, stage 3b: Secondary | ICD-10-CM

## 2020-06-26 ENCOUNTER — Ambulatory Visit
Admission: RE | Admit: 2020-06-26 | Discharge: 2020-06-26 | Disposition: A | Discharge status: Home / Self Care | Payer: Managed Care, Other (non HMO) | Source: Ambulatory Visit | Attending: Nephrology | Admitting: Nephrology

## 2020-06-26 DIAGNOSIS — N1832 Chronic kidney disease, stage 3b: Secondary | ICD-10-CM

## 2020-08-10 ENCOUNTER — Other Ambulatory Visit: Payer: Self-pay | Admitting: Interventional Cardiology

## 2020-09-04 ENCOUNTER — Ambulatory Visit (HOSPITAL_COMMUNITY): Payer: Managed Care, Other (non HMO) | Attending: Cardiology

## 2020-09-04 ENCOUNTER — Other Ambulatory Visit: Payer: Self-pay

## 2020-09-04 DIAGNOSIS — I351 Nonrheumatic aortic (valve) insufficiency: Secondary | ICD-10-CM | POA: Diagnosis present

## 2020-09-04 LAB — ECHOCARDIOGRAM COMPLETE
AR max vel: 2.93 cm2
AV Area VTI: 3.66 cm2
AV Area mean vel: 3.29 cm2
AV Mean grad: 11 mmHg
AV Peak grad: 24.6 mmHg
Ao pk vel: 2.48 m/s
Area-P 1/2: 3.12 cm2
P 1/2 time: 199 msec
S' Lateral: 4.1 cm

## 2020-09-13 NOTE — Progress Notes (Signed)
Cardiology Office Note:    Date:  09/16/2020   ID:  Eric, [name] [birth year], MRN [id number]  PCP:  Elias Else, MD  Cardiologist:  Lesleigh Noe, MD   Referring MD: Elias Else, MD   Chief Complaint  Patient presents with  . Cardiac Valve Problem    History of Present Illness:    Eric [name] is a 64 y.o. male with a hx of a hx of severe aortic regurgitation,hypertension, left ventricular hypertrophy, and chronic kidney disease stage III.Recent Covid 19 infection (September 2020)  Eric [name] feels well.  He denies shortness of breath, angina, palpitations, syncope, and peripheral edema.  He has no difficulty sleeping.  He is able to lay flat.  He does not notice limitations in his typical physical activities.  He is not exercising on a regular basis.  Recent echo is now showing progressive enlargement of the ascending aorta in addition to severe aortic regurgitation.  His LV is still holding its on but I am concerned that we are progressing.  There is slight increase in LV size.  LV and systolic dimension index is 18.5.  Past Medical History:  Diagnosis Date  . Allergy    ALLERGIC RHINITIS..WORSE SPRING/FALL  . Aortic valve regurgitation 07/10/14   ECHO @ [facility]  . GERD (gastroesophageal reflux disease)    CURRENTLY DIET CONTROLLED  . Hypertension   . Sinusitis, chronic     Past Surgical History:  Procedure Laterality Date  . KNEE ARTHROSCOPY Bilateral     Current Medications: Current Meds  Medication Sig  . acetaminophen (TYLENOL) 325 MG tablet Take 650 mg by mouth every 6 (six) hours as needed for mild pain or headache.  . allopurinol (ZYLOPRIM) 300 MG tablet Take 300 mg by mouth daily.  . cetirizine (ZYRTEC) 10 MG tablet Take 10 mg by mouth as needed for allergies.  . Ferrous Sulfate (IRON) 325 (65 Fe) MG TABS Take 1 tablet by mouth daily.  . hydrochlorothiazide (HYDRODIURIL) 25 MG tablet TAKE 1/2 TABLET(12.5 MG) BY MOUTH DAILY  . rosuvastatin  (CRESTOR) 10 MG tablet Take 1 tablet (10 mg total) by mouth daily.  . [DISCONTINUED] vitamin C (VITAMIN C) 500 MG tablet Take 1 tablet (500 mg total) by mouth daily.     Allergies:   Amlodipine   Social History   Socioeconomic History  . Marital status: Married    Spouse name: [name]  . Number of children: 3  . Years of education: 66  . Highest education level: Master's degree (e.g., MA, MS, MEng, MEd, MSW, MBA)  Occupational History  . Occupation: Art gallery manager  Tobacco Use  . Smoking status: Never Smoker  . Smokeless tobacco: Never Used  Vaping Use  . Vaping Use: Never used  Substance and Sexual Activity  . Alcohol use: Yes    Alcohol/week: 0.0 standard drinks    Comment: occasional  . Drug use: No  . Sexual activity: Yes  Other Topics Concern  . Not on file  Social History Narrative  . Not on file   Social Determinants of Health   Financial Resource Strain:   . Difficulty of Paying Living Expenses: Not on file  Food Insecurity:   . Worried About Programme researcher, broadcasting/film/video in the Last Year: Not on file  . Ran Out of Food in the Last Year: Not on file  Transportation Needs:   . Lack of Transportation (Medical): Not on file  . Lack of Transportation (Non-Medical): Not on file  Physical  Activity:   . Days of Exercise per Week: Not on file  . Minutes of Exercise per Session: Not on file  Stress:   . Feeling of Stress : Not on file  Social Connections:   . Frequency of Communication with Friends and Family: Not on file  . Frequency of Social Gatherings with Friends and Family: Not on file  . Attends Religious Services: Not on file  . Active Member of Clubs or Organizations: Not on file  . Attends Banker Meetings: Not on file  . Marital Status: Not on file     Family History: The patient's family history includes Cancer in his brother, father, and maternal grandmother; Diabetes in his brother and daughter; Heart disease in his maternal grandmother; Hypertension in  his mother.  ROS:   Please see the history of present illness.    He is aware of aortic valve disease.  He has severe aortic regurgitation.  All other systems reviewed and are negative.  EKGs/Labs/Other Studies Reviewed:    The following studies were reviewed today: 2D Doppler echocardiogram performed September 04, 2020: IMPRESSIONS    1. Left ventricular ejection fraction, by estimation, is 55 to 60%. The  left ventricle has normal function. The left ventricle has no regional  wall motion abnormalities. The left ventricular internal cavity size was  mildly dilated. There is moderate  left ventricular hypertrophy. Left ventricular diastolic parameters are  consistent with Grade II diastolic dysfunction (pseudonormalization).  2. Right ventricular systolic function is normal. The right ventricular  size is normal. Tricuspid regurgitation signal is inadequate for assessing  PA pressure.  3. The mitral valve is normal in structure. Mild mitral valve  regurgitation. No evidence of mitral stenosis.  4. The aortic valve is tricuspid. Aortic valve regurgitation is severe  with pressure halftime 199 msec. No aortic stenosis is present. Aortic  valve mean gradient measures 11.0 mmHg, suspect this is due to high flow  in setting of severe AI rather than  true AS.  5. Aortic dilatation noted. There is mild dilatation of the ascending  aorta, measuring 42 mm.  Last prior echo aortic diameter was 3.6 cm. April 2021 and 4.0 October 2020. 6. The inferior vena cava is normal in size with greater than 50%  respiratory variability, suggesting right atrial pressure of 3 mmHg.   Comparison(s): 02/28/20 EF 55-60%. Severe AI.    AORTIC REGURGITATION TRACKING: ECHOCARDIOGRAPHIC f/u of AR and LVESD and ESDi:  08/2017: 32 mm and 14.9 mm/M2  08/2018: 37.3 mm and 17.4 mm/M2  08/2019: 43 mm and 20.1 mm/M2  02/28/20: 35.2 mm and 16.55 mm/m2  09/04/20: 41 mm and 18.8 mm/M2  LVESDi > 20 mm/M2  who have surgery get better outcomes and those using traditional >25 mm/M2 ([facility] [place] [facility] [place] ([employer] 06/2018)  EKG:  EKG normal sinus rhythm, left atrial abnormality, otherwise unremarkable.  Prominent voltage.  When compared to August 2020, heart rate is slower.  Recent Labs: 09/20/2019: BUN 25; Creatinine, Ser 1.86; Potassium 3.9; Sodium 138 05/15/2020: ALT 30  Recent Lipid Panel    Component Value Date/Time   CHOL 144 05/15/2020 0852   TRIG 58 05/15/2020 0852   HDL 55 05/15/2020 0852   CHOLHDL 2.6 05/15/2020 0852   LDLCALC 77 05/15/2020 0852    Physical Exam:    VS:  BP 128/68   Pulse 69   Ht 5\' 10"  (1.778 m)   Wt 214 lb 9.6 oz (97.3 kg)  SpO2 97%   BMI 30.79 kg/m     Wt Readings from Last 3 Encounters:  09/16/20 214 lb 9.6 oz (97.3 kg)  03/04/20 220 lb 6.4 oz (100 kg)  09/06/19 209 lb (94.8 kg)     GEN: Slightly overweight. No acute distress HEENT: Normal NECK: No JVD. LYMPHATICS: No lymphadenopathy CARDIAC: 3/6 to 4/6 decrescendo holodiastolic murmur of aortic regurgitation.  2/6 right upper sternal systolic diamond-shaped murmur.  RRR gallop, or edema. VASCULAR:  Normal Pulses. No bruits. RESPIRATORY:  Clear to auscultation without rales, wheezing or rhonchi  ABDOMEN: Soft, non-tender, non-distended, No pulsatile mass, MUSCULOSKELETAL: No deformity  SKIN: Warm and dry NEUROLOGIC:  Alert and oriented x 3 PSYCHIATRIC:  Normal affect   ASSESSMENT:    1. Nonrheumatic aortic valve insufficiency   2. Hyperlipidemia with target LDL less than 70   3. Essential hypertension   4. 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 Educated about COVID-19 virus infection    PLAN:    In order of problems listed above:  1. Probable bicuspid aortic valve with severe aortic regurgitation.  The patient is asymptomatic.  LV is slowly getting larger.  Aorta is now starting to dilate.  I have recommended that he see Dr. Evelene Croon to allow comanagement of his problem.  He will likely need aortic valve replacement plus minus aortic root replacement.  He seeks a nonsurgical valve answer.  I have explained to him that there is no FDA approved device for managing aortic regurgitation.  He will see me back in 6 months at which time a repeat echo will be done. 2. Continue statin therapy to decrease risk of occlusive vascular disease. 3. Blood pressure control is adequate.  Continue carvedilol and HydroDIURIL.  Have not further increased beta-blocker therapy so as to avoid excessive bradycardia and prolonged diastolic regurgitation. 4. Stage IIIa CKD.  Probably hypertensive in nature. 5. He has been vaccinated and is also had COVID-19 infection.  Overall plan is to see the patient in 6 months.  Repeat echo prior to that visit.  See Dr. Laneta Simmers.  Begin preparation for surgical therapy at some point in the not too distant future.   Medication Adjustments/Labs and Tests Ordered: Current medicines are reviewed at length with the patient today.  Concerns regarding medicines are outlined above.  Orders Placed This Encounter  Procedures  . Ambulatory referral to Cardiothoracic Surgery  . EKG 12-Lead  . ECHOCARDIOGRAM COMPLETE   No orders of the defined types were placed in this encounter.   Patient Instructions  Medication Instructions:  Your physician recommends that you continue on your current medications as directed. Please refer to the Current Medication list given to you today.  *If you need a refill on your cardiac medications before your next appointment, please call your pharmacy*   Lab Work: None If you have labs (blood work) drawn today and your tests are completely normal, you will receive your results only by: Marland Kitchen MyChart Message (if you have MyChart) OR . A paper copy in the mail If you have any lab test that is abnormal or we need to change your treatment, we will call you to review the  results.   Testing/Procedures: Your physician has requested that you have an echocardiogram 1-2 weeks prior to seeing Dr. Katrinka Blazing back in 6 months. Echocardiography is a painless test that uses sound waves to create images of your heart. It provides your doctor with information about  the size and shape of your heart and how well your heart's chambers and valves are working. This procedure takes approximately one hour. There are no restrictions for this procedure.     Follow-Up: At [facility], you and your health needs are our priority.  As part of our continuing mission to provide you with exceptional heart care, we have created designated Provider Care Teams.  These Care Teams include your primary Cardiologist (physician) and Advanced Practice Providers (APPs -  Physician Assistants and Nurse Practitioners) who all work together to provide you with the care you need, when you need it.  We recommend signing up for the patient portal called "MyChart".  Sign up information is provided on this After Visit Summary.  MyChart is used to connect with patients for Virtual Visits (Telemedicine).  Patients are able to view lab/test results, encounter notes, upcoming appointments, etc.  Non-urgent messages can be sent to your provider as well.   To learn more about what you can do with MyChart, go to [email or web address].    Your next appointment:   6 month(s)  The format for your next appointment:   In Person  Provider:   You may see Lesleigh NoeHenry W Chance Karam III, MD or one of the following Advanced Practice Providers on your designated Care Team:    Norma FredricksonLori Gerhardt, NP  Nada BoozerLaura Ingold, NP  Georgie ChardJill McDaniel, NP    Other Instructions  You have been referred to Dr. Evelene CroonBryan Bartle, one of our Cardiothoracic Surgeons to discuss your aortic valve.  Dr. Katrinka BlazingSmith would like for you to see him before the end of the year.      Signed, Lesleigh NoeHenry W Nala Kachel III, MD  09/16/2020 9:35 AM    [facility]

## 2020-09-16 ENCOUNTER — Ambulatory Visit (INDEPENDENT_AMBULATORY_CARE_PROVIDER_SITE_OTHER): Payer: Managed Care, Other (non HMO) | Admitting: Interventional Cardiology

## 2020-09-16 ENCOUNTER — Other Ambulatory Visit: Payer: Self-pay

## 2020-09-16 ENCOUNTER — Encounter: Payer: Self-pay | Admitting: Interventional Cardiology

## 2020-09-16 VITALS — BP 128/68 | HR 69 | Ht 70.0 in | Wt 214.6 lb

## 2020-09-16 DIAGNOSIS — I351 Nonrheumatic aortic (valve) insufficiency: Secondary | ICD-10-CM | POA: Diagnosis not present

## 2020-09-16 DIAGNOSIS — I130 Hypertensive heart and chronic kidney disease with heart failure and stage 1 through stage 4 chronic kidney disease, or unspecified chronic kidney disease: Secondary | ICD-10-CM | POA: Diagnosis not present

## 2020-09-16 DIAGNOSIS — N183 Chronic kidney disease, stage 3 unspecified: Secondary | ICD-10-CM

## 2020-09-16 DIAGNOSIS — I10 Essential (primary) hypertension: Secondary | ICD-10-CM | POA: Diagnosis not present

## 2020-09-16 DIAGNOSIS — E785 Hyperlipidemia, unspecified: Secondary | ICD-10-CM | POA: Diagnosis not present

## 2020-09-16 DIAGNOSIS — Z7189 Other specified counseling: Secondary | ICD-10-CM

## 2020-09-16 NOTE — Patient Instructions (Addendum)
Medication Instructions:  Your physician recommends that you continue on your current medications as directed. Please refer to the Current Medication list given to you today.  *If you need a refill on your cardiac medications before your next appointment, please call your pharmacy*   Lab Work: None If you have labs (blood work) drawn today and your tests are completely normal, you will receive your results only by: Marland Kitchen MyChart Message (if you have MyChart) OR . A paper copy in the mail If you have any lab test that is abnormal or we need to change your treatment, we will call you to review the results.   Testing/Procedures: Your physician has requested that you have an echocardiogram 1-2 weeks prior to seeing Dr. Katrinka Blazing back in 6 months. Echocardiography is a painless test that uses sound waves to create images of your heart. It provides your doctor with information about the size and shape of your heart and how well your heart's chambers and valves are working. This procedure takes approximately one hour. There are no restrictions for this procedure.     Follow-Up: At [facility], you and your health needs are our priority.  As part of our continuing mission to provide you with exceptional heart care, we have created designated Provider Care Teams.  These Care Teams include your primary Cardiologist (physician) and Advanced Practice Providers (APPs -  Physician Assistants and Nurse Practitioners) who all work together to provide you with the care you need, when you need it.  We recommend signing up for the patient portal called "MyChart".  Sign up information is provided on this After Visit Summary.  MyChart is used to connect with patients for Virtual Visits (Telemedicine).  Patients are able to view lab/test results, encounter notes, upcoming appointments, etc.  Non-urgent messages can be sent to your provider as well.   To learn more about what you can do with MyChart, go to  [email or web address].    Your next appointment:   6 month(s)  The format for your next appointment:   In Person  Provider:   You may see Lesleigh Noe, MD or one of the following Advanced Practice Providers on your designated Care Team:    Norma Fredrickson, NP  Nada Boozer, NP  Georgie Chard, NP    Other Instructions  You have been referred to Dr. Evelene Croon, one of our Cardiothoracic Surgeons to discuss your aortic valve.  Dr. Katrinka Blazing would like for you to see him before the end of the year.

## 2020-09-30 ENCOUNTER — Other Ambulatory Visit: Payer: Self-pay

## 2020-09-30 ENCOUNTER — Other Ambulatory Visit: Payer: Self-pay | Admitting: Surgery

## 2020-09-30 ENCOUNTER — Encounter: Payer: Self-pay | Admitting: Surgery

## 2020-09-30 ENCOUNTER — Institutional Professional Consult (permissible substitution) (INDEPENDENT_AMBULATORY_CARE_PROVIDER_SITE_OTHER): Payer: Managed Care, Other (non HMO) | Admitting: Surgery

## 2020-09-30 VITALS — BP 162/69 | HR 63 | Resp 18 | Ht 70.0 in | Wt 216.0 lb

## 2020-09-30 DIAGNOSIS — I351 Nonrheumatic aortic (valve) insufficiency: Secondary | ICD-10-CM

## 2020-09-30 NOTE — Progress Notes (Signed)
Patient ID: Eric [name], male   DOB: [birth year], 64 y.o.   MRN: [id number]  [facility]  MULTIDISCIPLINARY HEART VALVE CLINIC  CARDIOTHORACIC SURGERY CONSULTATION REPORT  Referring Provider is Lyn Records, MD Primary Cardiologist is Lesleigh Noe, MD PCP is Elias Else, MD  Chief Complaint  Patient presents with  . Aortic Insuffiency    ECHO 10/15    HPI:  The patient is a 64 year old gentleman with history of hypertension, a long history of stage III chronic kidney disease with a creatinine of 1.67 in 2016 and 1.86 a year ago, left ventricular hypertrophy, and severe aortic insufficiency that has been followed by Dr. Katrinka Blazing with periodic echocardiograms.  His most recent echocardiogram on 09/04/2020 showed a left ventricular ejection fraction of 55 to 60% with moderate LVH.  There is grade 2 diastolic dysfunction.  Left ventricular diastolic internal diameter was 5.8 cm and systolic was 4.1 cm.  This has been stable over the past year.  The aortic valve appears trileaflet with severe regurgitation and a pressure half-time of 199 ms.  The ascending aorta was measured at 4.2 cm on his current echo and was measured at 3.8 cm on his prior echo in April 2021.  He is here today with his wife.  He denies any symptoms of exertional shortness of breath or fatigue.  He has not been exercising much.  He has had no chest pain or pressure.  He denies dizziness and syncope.  He said no orthopnea or PND.  Denies peripheral edema.  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  Past Medical History:  Diagnosis Date  . Allergy    ALLERGIC RHINITIS..WORSE SPRING/FALL  . Aortic valve regurgitation 07/10/14   ECHO @ [facility]  . GERD (gastroesophageal reflux disease)    CURRENTLY DIET CONTROLLED  . Hypertension   . Sinusitis, chronic     Past Surgical History:  Procedure Laterality Date  . KNEE ARTHROSCOPY Bilateral     Family History  Problem Relation Age of  Onset  . Hypertension Mother   . Cancer Father        STOMACH  . Diabetes Daughter   . Cancer Maternal Grandmother        STOMACH  . Heart disease Maternal Grandmother   . Cancer Brother        COLON  . Diabetes Brother     Social History   Socioeconomic History  . Marital status: Married    Spouse name: [name]  . Number of children: 3  . Years of education: 46  . Highest education level: Master's degree (e.g., MA, MS, MEng, MEd, MSW, MBA)  Occupational History  . Occupation: Art gallery manager  Tobacco Use  . Smoking status: Never Smoker  . Smokeless tobacco: Never Used  Vaping Use  . Vaping Use: Never used  Substance and Sexual Activity  . Alcohol use: Yes    Alcohol/week: 0.0 standard drinks    Comment: occasional  . Drug use: No  . Sexual activity: Yes  Other Topics Concern  . Not on file  Social History Narrative  . Not on file   Social Determinants of Health   Financial Resource Strain:   . Difficulty of Paying Living Expenses: Not on file  Food Insecurity:   . Worried About Programme researcher, broadcasting/film/video in the Last Year: Not on file  . Ran Out of Food in the Last Year: Not on file  Transportation Needs:   . Lack  of Transportation (Medical): Not on file  . Lack of Transportation (Non-Medical): Not on file  Physical Activity:   . Days of Exercise per Week: Not on file  . Minutes of Exercise per Session: Not on file  Stress:   . Feeling of Stress : Not on file  Social Connections:   . Frequency of Communication with Friends and Family: Not on file  . Frequency of Social Gatherings with Friends and Family: Not on file  . Attends Religious Services: Not on file  . Active Member of Clubs or Organizations: Not on file  . Attends BankerClub or Organization Meetings: Not on file  . Marital Status: Not on file  Intimate Partner Violence:   . Fear of Current or Ex-Partner: Not on file  . Emotionally Abused: Not on file  . Physically Abused: Not on file  . Sexually Abused: Not on file     Current Outpatient Medications  Medication Sig Dispense Refill  . acetaminophen (TYLENOL) 325 MG tablet Take 650 mg by mouth every 6 (six) hours as needed for mild pain or headache.    . allopurinol (ZYLOPRIM) 300 MG tablet Take 300 mg by mouth daily.  4  . cetirizine (ZYRTEC) 10 MG tablet Take 10 mg by mouth as needed for allergies.    . hydrochlorothiazide (HYDRODIURIL) 25 MG tablet TAKE 1/2 TABLET(12.5 MG) BY MOUTH DAILY 45 tablet 3  . rosuvastatin (CRESTOR) 10 MG tablet Take 1 tablet (10 mg total) by mouth daily. 90 tablet 3  . carvedilol (COREG) 3.125 MG tablet Take 1 tablet (3.125 mg total) by mouth 2 (two) times daily. 180 tablet 3   No current facility-administered medications for this visit.    Allergies  Allergen Reactions  . Amlodipine Cough      Review of Systems:   General:  normal appetite, normal energy, no weight gain, no weight loss, no fever  Cardiac:  no chest pain with exertion, no chest pain at rest, noSOB with  exertion, no resting SOB, no PND, no orthopnea, no palpitations, no arrhythmia, no atrial fibrillation, no LE edema, no dizzy spells, no syncope  Respiratory:  no shortness of breath, no home oxygen, no productive cough, no dry cough, no bronchitis, no wheezing, no hemoptysis, no asthma, no pain with inspiration or cough, no sleep apnea, no CPAP at night  GI:   no difficulty swallowing, no reflux, no frequent heartburn, no hiatal hernia, no abdominal pain, no constipation, no diarrhea, no hematochezia, no hematemesis, no melena  GU:   no dysuria,  no frequency, no urinary tract infection, no hematuria, no enlarged prostate, no kidney stones, + chronic kidney disease  Vascular:  no pain suggestive of claudication, no pain in feet, no leg cramps, no varicose veins, no DVT, no non-healing foot ulcer  Neuro:   no stroke, no TIA's, no seizures, no headaches, no temporary blindness one eye,  no slurred speech, no peripheral neuropathy, no chronic pain, no  instability of gait, no memory/cognitive dysfunction  Musculoskeletal: no arthritis, no joint swelling, no myalgias, no difficulty walking, normal mobility   Skin:   no rash, no itching, no skin infections, no pressure sores or ulcerations  Psych:   no anxiety, no depression, no nervousness, no unusual recent stress  Eyes:   no blurry vision, no floaters, no recent vision changes, does not wear glasses or contacts  ENT:   no hearing loss, no loose or painful teeth, no dentures, last saw dentist past year  Hematologic:  no easy  bruising, no abnormal bleeding, no clotting disorder, no frequent epistaxis  Endocrine:  no diabetes, does not check CBG's at home      Physical Exam:   BP (!) 162/69 (BP Location: Right Arm, Patient Position: Sitting)   Pulse 63   Resp 18   Ht  (1.778 m)   Wt 216 lb (98 kg)   SpO2 95% Comment: RA with mask on  BMI 30.99 kg/m   General:  well-appearing  HEENT:  Unremarkable, NCAT, PERLA, EOMI  Neck:   no JVD, no bruits, no adenopathy   Chest:   clear to auscultation, symmetrical breath sounds, no wheezes, no rhonchi   CV:   RRR, grade ll/VI crescendo/decrescendo murmur heard best at RSB, lll/IV diastolic murmur LLSB  Abdomen:  soft, non-tender, no masses   Extremities:  warm, well-perfused, pulses palpable at ankle, no LE edema  Rectal/GU  Deferred  Neuro:   Grossly non-focal and symmetrical throughout  Skin:   Clean and dry, no rashes, no breakdown   Diagnostic Tests:  ECHOCARDIOGRAM REPORT       Patient Name:  Eric [name] Date of Exam: 09/04/2020  Medical Rec #: [id number]   Height:    70.0 in  Accession #:  [id number]  Weight:    220.4 lb  Date of Birth: [birth year]   BSA:     2.175 m  Patient Age:  64 years   BP:      124/54 mmHg  Patient Gender: M       HR:      87 bpm.  Exam Location: [address]   Procedure: 2D Echo, 3D Echo, Cardiac Doppler, Color Doppler and Strain  Analysis    Indications:  I35.1 Aortic Regurgitation    History:    Patient has prior history of Echocardiogram examinations,  most         recent 02/28/2020. Risk Factors:Hypertension. History of  COVID 19.         Benign hypertensive heart and CKD.    Sonographer:  Garald Braver, RDCS  Referring Phys: [phone] [facility]   IMPRESSIONS    1. Left ventricular ejection fraction, by estimation, is 55 to 60%. The  left ventricle has normal function. The left ventricle has no regional  wall motion abnormalities. The left ventricular internal cavity size was  mildly dilated. There is moderate  left ventricular hypertrophy. Left ventricular diastolic parameters are  consistent with Grade II diastolic dysfunction (pseudonormalization).  2. Right ventricular systolic function is normal. The right ventricular  size is normal. Tricuspid regurgitation signal is inadequate for assessing  PA pressure.  3. The mitral valve is normal in structure. Mild mitral valve  regurgitation. No evidence of mitral stenosis.  4. The aortic valve is tricuspid. Aortic valve regurgitation is severe  with pressure halftime 199 msec. No aortic stenosis is present. Aortic  valve mean gradient measures 11.0 mmHg, suspect this is due to high flow  in setting of severe AI rather than  true AS.  5. Aortic dilatation noted. There is mild dilatation of the ascending  aorta, measuring 42 mm.  6. The inferior vena cava is normal in size with greater than 50%  respiratory variability, suggesting right atrial pressure of 3 mmHg.   Comparison(s): 02/28/20 EF 55-60%. Severe AI.   FINDINGS  Left Ventricle: Left ventricular ejection fraction, by estimation, is 55  to 60%. The left ventricle has normal function. The left ventricle has no  regional wall motion abnormalities. The  left ventricular internal cavity  size was mildly dilated. There is  moderate left ventricular hypertrophy. Left ventricular  diastolic  parameters are consistent with Grade II diastolic dysfunction  (pseudonormalization).   Right Ventricle: The right ventricular size is normal. No increase in  right ventricular wall thickness. Right ventricular systolic function is  normal. Tricuspid regurgitation signal is inadequate for assessing PA  pressure.   Left Atrium: Left atrial size was normal in size.   Right Atrium: Right atrial size was normal in size.   Pericardium: Trivial pericardial effusion is present.   Mitral Valve: The mitral valve is normal in structure. Mild mitral valve  regurgitation. No evidence of mitral valve stenosis.   Tricuspid Valve: The tricuspid valve is normal in structure. Tricuspid  valve regurgitation is trivial.   Aortic Valve: The aortic valve is tricuspid. Aortic valve regurgitation is  severe. Aortic regurgitation PHT measures 199 msec. No aortic stenosis is  present. Aortic valve mean gradient measures 11.0 mmHg. Aortic valve peak  gradient measures 24.6 mmHg.  Aortic valve area, by VTI measures 3.66 cm.   Pulmonic Valve: The pulmonic valve was normal in structure. Pulmonic valve  regurgitation is not visualized.   Aorta: Aortic dilatation noted. There is mild dilatation of the ascending  aorta, measuring 42 mm.   Venous: The inferior vena cava is normal in size with greater than 50%  respiratory variability, suggesting right atrial pressure of 3 mmHg.   IAS/Shunts: No atrial level shunt detected by color flow Doppler.     LEFT VENTRICLE  PLAX 2D  LVIDd:     5.80 cm Diastology  LVIDs:     4.10 cm LV e' medial:  5.22 cm/s  LV PW:     1.20 cm LV E/e' medial: 12.2  LV IVS:    1.20 cm LV e' lateral:  9.25 cm/s  LVOT diam:   2.60 cm LV E/e' lateral: 6.9  LV SV:     166  LV SV Index:  76    2D Longitudinal Strain  LVOT Area:   5.31 cm 2D Strain GLS (A2C):  -21.1 %             2D Strain GLS (A3C):  -16.5 %              2D Strain GLS (A4C):  -21.1 %             2D Strain GLS Avg:   -19.6 %               3D Volume EF:             3D EF:    55 %             LV EDV:    270 ml             LV ESV:    122 ml             LV SV:    149 ml   RIGHT VENTRICLE  RV Basal diam: 3.50 cm  RV S prime:   18.80 cm/s  TAPSE (M-mode): 2.7 cm   LEFT ATRIUM       Index    RIGHT ATRIUM      Index  LA diam:    4.40 cm 2.02 cm/m RA Area:   21.20 cm  LA Vol (A2C):  52.4 ml 24.09 ml/m RA Volume:  60.40 ml 27.77 ml/m  LA Vol (A4C):  64.6 ml 29.70  ml/m  LA Biplane Vol: 62.0 ml 28.50 ml/m  AORTIC VALVE  AV Area (Vmax):  2.93 cm  AV Area (Vmean):  3.29 cm  AV Area (VTI):   3.66 cm  AV Vmax:      248.00 cm/s  AV Vmean:     151.000 cm/s  AV VTI:      0.452 m  AV Peak Grad:   24.6 mmHg  AV Mean Grad:   11.0 mmHg  LVOT Vmax:     137.00 cm/s  LVOT Vmean:    93.500 cm/s  LVOT VTI:     0.312 m  LVOT/AV VTI ratio: 0.69  AI PHT:      199 msec    AORTA  Ao Root diam: 3.90 cm  Ao Asc diam: 4.05 cm   MITRAL VALVE  MV Area (PHT):       SHUNTS  MV Decel Time: 243 msec  Systemic VTI: 0.31 m  MV E velocity: 63.80 cm/s Systemic Diam: 2.60 cm  MV A velocity: 62.60 cm/s  MV E/A ratio: 1.02   Marca Ancona MD  Electronically signed by Marca Ancona MD  Signature Date/Time: 09/04/2020/3:26:39 PM      Final    Impression:  This 64 year old gentleman has stage C, asymptomatic, severe aortic insufficiency.  His echocardiogram shows normal left ventricular systolic function with moderate LVH and significant left ventricular dilatation.  His aortic valve looked trileaflet on his echocardiogram.  The ascending aorta was measured at 4.2 cm but I think it is difficult to tell how large the aorta is on this study.  His aortic  root does not look significantly enlarged to me.  I agree that aortic valve replacement is indicated in this patient with severe aortic insufficiency and significant left ventricular dilatation.  I think following this further role and result in progressive left ventricular deterioration.  I reviewed the echo images with the patient and his wife and answered their questions.  I recommended performing a gated cardiac CTA and CTA of the chest to evaluate the aortic valve morphology and coronary anatomy as well as the size of the thoracic aorta.  This will help in decision making about the need for aortic replacement and may obviate the need for cardiac catheterization.  He was hoping for a nonsurgical option but I explained to him why TAVR was not an option for aortic insufficiency.  I discussed the options of mechanical and bioprosthetic aortic valve replacement and the pros and cons of both.  He is inclined to use a bioprosthetic valve to avoid the need for anticoagulation with Coumadin.  I think that would be a reasonable choice at his age.  He has stage III chronic kidney disease which may related to hypertension and that will need to be kept in mind and make any decisions about contrast studies or surgery.   Plan:  He will be scheduled for a gated cardiac CTA to evaluate aortic valve morphology and coronary anatomy and CTA of the chest to evaluate the thoracic aorta.  I will see him back after that is completed to review the studies with him and his wife and make further surgical plans.  I spent 60 minutes performing this consultation and > 50% of this time was spent face to face counseling and coordinating the care of this patient's severe aortic insufficiency.    Alleen Borne, MD 09/30/2020

## 2020-10-07 ENCOUNTER — Other Ambulatory Visit: Payer: Self-pay | Admitting: Surgery

## 2020-10-07 DIAGNOSIS — I351 Nonrheumatic aortic (valve) insufficiency: Secondary | ICD-10-CM

## 2020-10-13 ENCOUNTER — Other Ambulatory Visit (HOSPITAL_COMMUNITY): Payer: Managed Care, Other (non HMO)

## 2020-10-13 ENCOUNTER — Ambulatory Visit (HOSPITAL_COMMUNITY): Admission: RE | Admit: 2020-10-13 | Payer: Managed Care, Other (non HMO) | Source: Ambulatory Visit

## 2020-10-16 ENCOUNTER — Ambulatory Visit (HOSPITAL_COMMUNITY)
Admission: RE | Admit: 2020-10-16 | Discharge: 2020-10-16 | Disposition: A | Discharge status: Home / Self Care | Payer: Managed Care, Other (non HMO) | Source: Ambulatory Visit | Attending: Surgery | Admitting: Surgery

## 2020-10-16 ENCOUNTER — Ambulatory Visit (HOSPITAL_COMMUNITY): Payer: Managed Care, Other (non HMO)

## 2020-10-20 ENCOUNTER — Ambulatory Visit (HOSPITAL_COMMUNITY): Payer: Managed Care, Other (non HMO)

## 2020-10-21 ENCOUNTER — Ambulatory Visit: Payer: Managed Care, Other (non HMO) | Admitting: Surgery

## 2020-10-22 ENCOUNTER — Telehealth (HOSPITAL_COMMUNITY): Payer: Self-pay | Admitting: Emergency Medicine

## 2020-10-22 LAB — BASIC METABOLIC PANEL
BUN/Creatinine Ratio: 13 (calc) (ref 6–22)
BUN: 24 mg/dL (ref 7–25)
CO2: 29 mmol/L (ref 20–32)
Calcium: 9.5 mg/dL (ref 8.6–10.3)
Chloride: 101 mmol/L (ref 98–110)
Creat: 1.85 mg/dL — ABNORMAL HIGH (ref 0.70–1.25)
Glucose, Bld: 92 mg/dL (ref 65–139)
Potassium: 3.9 mmol/L (ref 3.5–5.3)
Sodium: 137 mmol/L (ref 135–146)

## 2020-10-22 NOTE — Telephone Encounter (Signed)
Reaching out to patient to offer assistance regarding upcoming cardiac imaging study; pt verbalizes understanding of appt date/time, parking situation and where to check in, pre-test NPO status and medications ordered, and verified current allergies; name and call back number provided for further questions should they arise Rockwell Alexandria RN Navigator Cardiac Imaging [facility] [phone] office [phone] cell   Pt instructed to check in at admitting dept at 1 for infusion clinic/IV hydration appt.  Pt verbalized understanding. [place] Dec

## 2020-10-23 ENCOUNTER — Other Ambulatory Visit: Payer: Self-pay

## 2020-10-23 ENCOUNTER — Ambulatory Visit (HOSPITAL_COMMUNITY)
Admission: RE | Admit: 2020-10-23 | Discharge: 2020-10-23 | Disposition: A | Discharge status: Home / Self Care | Payer: Managed Care, Other (non HMO) | Source: Ambulatory Visit | Attending: Surgery | Admitting: Surgery

## 2020-10-23 DIAGNOSIS — I351 Nonrheumatic aortic (valve) insufficiency: Secondary | ICD-10-CM

## 2020-10-23 LAB — BASIC METABOLIC PANEL
Anion gap: 9 (ref 5–15)
BUN: 27 mg/dL — ABNORMAL HIGH (ref 8–23)
CO2: 26 mmol/L (ref 22–32)
Calcium: 9.2 mg/dL (ref 8.9–10.3)
Chloride: 101 mmol/L (ref 98–111)
Creatinine, Ser: 1.86 mg/dL — ABNORMAL HIGH (ref 0.61–1.24)
GFR, Estimated: 40 mL/min — ABNORMAL LOW (ref >60)
Glucose, Bld: 104 mg/dL — ABNORMAL HIGH (ref 70–99)
Potassium: 4.1 mmol/L (ref 3.5–5.1)
Sodium: 136 mmol/L (ref 135–145)

## 2020-10-23 MED ORDER — SODIUM CHLORIDE 0.9 % WEIGHT BASED INFUSION
3.0000 mL/kg/h | INTRAVENOUS | Status: AC
  Administered 2020-10-23: 3 mL/kg/h via INTRAVENOUS

## 2020-10-23 MED ORDER — SODIUM CHLORIDE 0.9 % WEIGHT BASED INFUSION: 1.0000 mL/kg/h | INTRAVENOUS | Status: DC

## 2020-10-23 MED ORDER — NITROGLYCERIN 0.4 MG SL SUBL
SUBLINGUAL_TABLET | SUBLINGUAL | Status: AC
  Filled 2020-10-23: qty 2

## 2020-10-23 MED ORDER — IOHEXOL 350 MG/ML SOLN
80.0000 mL | Freq: Once | INTRAVENOUS | Status: AC | PRN
  Administered 2020-10-23: 80 mL via INTRAVENOUS

## 2020-10-25 ENCOUNTER — Other Ambulatory Visit: Payer: Self-pay | Admitting: Interventional Cardiology

## 2020-10-28 ENCOUNTER — Encounter: Payer: Self-pay | Admitting: Surgery

## 2020-10-28 ENCOUNTER — Other Ambulatory Visit: Payer: Self-pay

## 2020-10-28 ENCOUNTER — Ambulatory Visit (INDEPENDENT_AMBULATORY_CARE_PROVIDER_SITE_OTHER): Payer: Managed Care, Other (non HMO) | Admitting: Surgery

## 2020-10-28 VITALS — BP 146/74 | HR 68 | Temp 97.7°F | Resp 18 | Wt 220.2 lb

## 2020-10-28 DIAGNOSIS — I351 Nonrheumatic aortic (valve) insufficiency: Secondary | ICD-10-CM | POA: Diagnosis not present

## 2020-10-29 ENCOUNTER — Other Ambulatory Visit: Payer: Self-pay | Admitting: *Deleted

## 2020-10-29 ENCOUNTER — Encounter: Payer: Self-pay | Admitting: *Deleted

## 2020-10-29 DIAGNOSIS — I351 Nonrheumatic aortic (valve) insufficiency: Secondary | ICD-10-CM

## 2020-10-29 NOTE — Progress Notes (Signed)
HPI:  The patient returns today to review his recent gated cardiac CTA with coronaries and CTA of the chest.  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  His gated cardiac CTA shows a trileaflet aortic valve with thickened leaflets and central lung coaptation.  There is normal coronary artery origins with no significant stenosis noted.  The maximum diameter of the ascending thoracic aorta was 40 mm with the sinuses measuring 36, 36, and and 37 mm.  The sinotubular junction was 34 x 34 mm.  The descending thoracic aorta measured 30 x 39 mm.  Current Outpatient Medications  Medication Sig Dispense Refill  . acetaminophen (TYLENOL) 325 MG tablet Take 650 mg by mouth every 6 (six) hours as needed for mild pain or headache.    . allopurinol (ZYLOPRIM) 300 MG tablet Take 300 mg by mouth daily.  4  . carvedilol (COREG) 3.125 MG tablet TAKE 1 TABLET(3.125 MG) BY MOUTH TWICE DAILY 180 tablet 3  . cetirizine (ZYRTEC) 10 MG tablet Take 10 mg by mouth as needed for allergies.    . hydrochlorothiazide (HYDRODIURIL) 25 MG tablet TAKE 1/2 TABLET(12.5 MG) BY MOUTH DAILY 45 tablet 3  . rosuvastatin (CRESTOR) 10 MG tablet Take 1 tablet (10 mg total) by mouth daily. 90 tablet 3   No current facility-administered medications for this visit.     Physical Exam: BP (!) 146/74 (BP Location: Right Arm, Patient Position: Sitting, Cuff Size: Normal)   Pulse 68   Temp 97.7 F (36.5 C) (Skin)   Resp 18   Wt 220 lb 3.2 oz (99.9 kg)   SpO2 96% Comment: RA  BMI 31.60 kg/m    Diagnostic Tests:  ADDENDUM REPORT: 10/28/2020 13:15  CLINICAL DATA:  64 year old male with h/o severe aortic insufficiency scheduled for AVR.  EXAM: Cardiac/Coronary  CTA  TECHNIQUE: The patient was scanned on a [employer].  FINDINGS: A 100 kV prospective scan was triggered in the descending thoracic aorta at 111 HU's. Axial non-contrast 3 mm slices were  carried out through the heart. The data set was analyzed on a dedicated work station and scored using the Agatson method. Gantry rotation speed was 250 msecs and collimation was .6 mm. No beta blockade and 0.8 mg of sl NTG was given. The 3D data set was reconstructed in 5% intervals of the 67-82 % of the R-R cycle. Diastolic phases were analyzed on a dedicated work station using MPR, MIP and VRT modes. The patient received 80 cc of contrast.  Aorta: Upper normal size of the ascending aorta with maximum diameter 40 mm. Minimal atherosclerotic plaque and calcifications. No dissection.  Sinotubular Junction: 34 x 34 mm  Ascending Thoracic Aorta: 40 x 39 mm  Aortic Arch: 31 x 29 mm  Descending Thoracic Aorta: 30 x 39 mm  Sinus of Valsalva Measurements:  Non-coronary: 37 mm  Right -coronary: 36 mm  Left -coronary: 36 mm  Aortic Valve: Trileaflet with thickened leaflets and central non-coaptation. No calcifications.  Coronary Arteries:  Normal coronary origin.  Right dominance.  RCA is a large dominant artery that gives rise to PDA and PLA. There are only minimal irregularities.  Left main is a large artery that gives rise to LAD and LCX arteries. Left main has only luminal irregularities.  LAD is a large vessel that gives rise to two large and one small diagonal arteries. Proximal LAD has minimal eccentric calcified plaque with stenosis 0-25%.  D1,2,3 have  no significant plaque.  LCX is a non-dominant artery that gives rise to one small OM1 branch. There is no plaque.  Other findings:  Normal pulmonary vein drainage into the left atrium.  Normal left atrial appendage without a thrombus.  Dilated pulmonary artery measuring 35 mm suggestive of pulmonary hypertension.  IMPRESSION: 1. Coronary calcium score of 32. This was 18 percentile for age and sex matched control.  2. Normal coronary origin with right dominance.  3. CAD-RADS 1.  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 Consider preventive therapy and risk factor modification.  4. Upper normal size of the ascending aorta with maximum diameter 40 mm. Minimal atherosclerotic plaque and calcifications. No dissection.  5. Aortic Valve: Trileaflet with thickened leaflets and central non-coaptation. No calcifications.   Electronically Signed   By: Tobias Alexander   On: 10/28/2020 13:15   Narrative & Impression  CLINICAL DATA:  64 year old male with history of severe aortic stenosis under preprocedural evaluation prior to potential transcatheter aortic valve replacement (TAVR) procedure.  EXAM: CT ANGIOGRAPHY CHEST WITH CONTRAST  TECHNIQUE: Multidetector CT imaging of the chest was performed using the standard protocol during bolus administration of intravenous contrast. Multiplanar CT image reconstructions and MIPs were obtained to evaluate the vascular anatomy.  CONTRAST:  65mL OMNIPAQUE IOHEXOL 350 MG/ML SOLN  COMPARISON:  No priors.  FINDINGS: Cardiovascular: Heart size is borderline enlarged with some concentric left ventricular hypertrophy. There is no significant pericardial fluid, thickening or pericardial calcification. There is aortic atherosclerosis, as well as atherosclerosis of the great vessels of the mediastinum and the coronary arteries, including calcified atherosclerotic plaque in the left anterior descending coronary artery. 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  Mediastinum/Nodes: No pathologically enlarged mediastinal or hilar lymph nodes. Esophagus is unremarkable in appearance. No axillary lymphadenopathy.  Lungs/Pleura: Patchy areas of ground-glass attenuation and mild interlobular septal thickening noted in the lungs bilaterally, nonspecific. No confluent consolidative airspace disease. No pleural effusions. A few scattered tiny 1-3 mm  pulmonary nodules are noted in the lungs, nonspecific, but statistically likely benign. No other larger more suspicious appearing pulmonary nodules or masses are noted.  Upper Abdomen: Exophytic low-attenuation lesion in the upper pole of the right kidney measuring 2.7 cm in diameter, compatible with a simple cyst.  Musculoskeletal: There are no aggressive appearing lytic or blastic lesions noted in the visualized portions of the skeleton.  Review of the MIP images confirms the above findings.  IMPRESSION: 1. 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 Recommend annual imaging followup by CTA or MRA. This recommendation follows 2010 ACCF/AHA/AATS/ACR/ASA/SCA/SCAI/SIR/STS/SVM Guidelines for the Diagnosis and Management of Patients with Thoracic Aortic Disease. Circulation. 2010; 121: W546-E703. Aortic aneurysm NOS (ICD10-I71.9). 2. Borderline cardiomegaly with concentric left ventricular hypertrophy. 3. Patchy areas of ground-glass attenuation and interlobular septal thickening in the lungs bilaterally, nonspecific. This could potentially reflect a background of mild interstitial pulmonary edema, or could be indicative of early interstitial lung disease. 4. Multiple tiny 1-3 mm pulmonary nodules scattered throughout the lungs bilaterally, nonspecific, but statistically likely benign. No follow-up needed if patient is low-risk (and has no known or suspected primary neoplasm). Non-contrast chest CT can be considered in 12 months if patient is high-risk. This recommendation follows the consensus statement: Guidelines for Management of Incidental Pulmonary Nodules Detected on CT Images: From the Fleischner Society 2017; Radiology 2017; 284:228-243.  Aortic Atherosclerosis (ICD10-I70.0).   Electronically Signed   By: Reuel Boom  Entrikin M.D.   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  The aortic root and ascending aorta are minimally enlarged with a maximum diameter of the ascending aorta at 40 mm.  I do not think there is any indication for replacing his aorta especially with a trileaflet aortic valve.  I recommended that he undergo aortic valve replacement.  This should be performed to prevent progressive left ventricular deterioration and development of symptoms of congestive heart failure.  I reviewed the CTA study with the patient and his wife and answered their questions.  We discussed the alternatives of mechanical and bioprosthetic valves and decided on using a bioprosthetic bovine pericardial valve.  Plan:  He will think about the timing of surgery and will call us to schedule aortic valve replacement.  I spent 15 minutes performing this established patient evaluation and > 50% of this time was spent face to face counseling and coordinating the care of this patient's aortic insufficiency.    Eric Borne, MD [facility] [phone]

## 2020-11-19 ENCOUNTER — Other Ambulatory Visit (HOSPITAL_COMMUNITY)
Admission: RE | Admit: 2020-11-19 | Discharge: 2020-11-19 | Disposition: A | Discharge status: Home / Self Care | Payer: Managed Care, Other (non HMO) | Source: Ambulatory Visit | Attending: Surgery | Admitting: Surgery

## 2020-11-19 ENCOUNTER — Encounter (HOSPITAL_COMMUNITY)
Admission: RE | Admit: 2020-11-19 | Discharge: 2020-11-19 | Disposition: A | Discharge status: Home / Self Care | Payer: Managed Care, Other (non HMO) | Source: Ambulatory Visit | Attending: Surgery | Admitting: Surgery

## 2020-11-19 ENCOUNTER — Other Ambulatory Visit: Payer: Self-pay

## 2020-11-19 ENCOUNTER — Ambulatory Visit (HOSPITAL_COMMUNITY)
Admission: RE | Admit: 2020-11-19 | Discharge: 2020-11-19 | Disposition: A | Discharge status: Home / Self Care | Payer: Managed Care, Other (non HMO) | Source: Ambulatory Visit | Attending: Surgery | Admitting: Surgery

## 2020-11-19 ENCOUNTER — Encounter (HOSPITAL_COMMUNITY): Payer: Self-pay

## 2020-11-19 DIAGNOSIS — I10 Essential (primary) hypertension: Secondary | ICD-10-CM | POA: Diagnosis not present

## 2020-11-19 DIAGNOSIS — I351 Nonrheumatic aortic (valve) insufficiency: Secondary | ICD-10-CM | POA: Diagnosis not present

## 2020-11-19 DIAGNOSIS — Z01818 Encounter for other preprocedural examination: Secondary | ICD-10-CM | POA: Insufficient documentation

## 2020-11-19 DIAGNOSIS — Z20822 Contact with and (suspected) exposure to covid-19: Secondary | ICD-10-CM | POA: Insufficient documentation

## 2020-11-19 HISTORY — DX: Cardiac murmur, unspecified: R01.1

## 2020-11-19 HISTORY — DX: Chronic kidney disease, unspecified: N18.9

## 2020-11-19 HISTORY — DX: Unspecified osteoarthritis, unspecified site: M19.90

## 2020-11-19 LAB — CBC
HCT: 37.2 % — ABNORMAL LOW (ref 39.0–52.0)
Hemoglobin: 12.7 g/dL — ABNORMAL LOW (ref 13.0–17.0)
MCH: 25.3 pg — ABNORMAL LOW (ref 26.0–34.0)
MCHC: 34.1 g/dL (ref 30.0–36.0)
MCV: 74.1 fL — ABNORMAL LOW (ref 80.0–100.0)
Platelets: 207 10*3/uL (ref 150–400)
RBC: 5.02 MIL/uL (ref 4.22–5.81)
RDW: 13.7 % (ref 11.5–15.5)
WBC: 7.1 10*3/uL (ref 4.0–10.5)
nRBC: 0 % (ref 0.0–0.2)

## 2020-11-19 LAB — URINALYSIS, ROUTINE W REFLEX MICROSCOPIC
Bilirubin Urine: NEGATIVE
Glucose, UA: NEGATIVE mg/dL
Hgb urine dipstick: NEGATIVE
Ketones, ur: NEGATIVE mg/dL
Leukocytes,Ua: NEGATIVE
Nitrite: NEGATIVE
Protein, ur: NEGATIVE mg/dL
Specific Gravity, Urine: 1.014 (ref 1.005–1.030)
pH: 6 (ref 5.0–8.0)

## 2020-11-19 LAB — COMPREHENSIVE METABOLIC PANEL
ALT: 28 U/L (ref 0–44)
AST: 28 U/L (ref 15–41)
Albumin: 3.9 g/dL (ref 3.5–5.0)
Alkaline Phosphatase: 71 U/L (ref 38–126)
Anion gap: 9 (ref 5–15)
BUN: 21 mg/dL (ref 8–23)
CO2: 24 mmol/L (ref 22–32)
Calcium: 9.4 mg/dL (ref 8.9–10.3)
Chloride: 105 mmol/L (ref 98–111)
Creatinine, Ser: 1.72 mg/dL — ABNORMAL HIGH (ref 0.61–1.24)
GFR, Estimated: 44 mL/min — ABNORMAL LOW (ref >60)
Glucose, Bld: 93 mg/dL (ref 70–99)
Potassium: 4.2 mmol/L (ref 3.5–5.1)
Sodium: 138 mmol/L (ref 135–145)
Total Bilirubin: 1 mg/dL (ref 0.3–1.2)
Total Protein: 7.1 g/dL (ref 6.5–8.1)

## 2020-11-19 LAB — SURGICAL PCR SCREEN
MRSA, PCR: NEGATIVE
Staphylococcus aureus: NEGATIVE

## 2020-11-19 LAB — PROTIME-INR
INR: 1 (ref 0.8–1.2)
Prothrombin Time: 12.9 seconds (ref 11.4–15.2)

## 2020-11-19 LAB — APTT: aPTT: 31 seconds (ref 24–36)

## 2020-11-19 LAB — SARS CORONAVIRUS 2 (TAT 6-24 HRS): SARS Coronavirus 2: NEGATIVE

## 2020-11-19 LAB — HEMOGLOBIN A1C
Hgb A1c MFr Bld: 5.6 % (ref 4.8–5.6)
Mean Plasma Glucose: 114.02 mg/dL

## 2020-11-19 LAB — TYPE AND SCREEN
ABO/RH(D): B POS
Antibody Screen: NEGATIVE

## 2020-11-19 MED ORDER — MILRINONE LACTATE IN DEXTROSE 20-5 MG/100ML-% IV SOLN
0.3000 ug/kg/min | INTRAVENOUS | Status: DC
  Filled 2020-11-19: qty 100

## 2020-11-19 MED ORDER — NITROGLYCERIN IN D5W 200-5 MCG/ML-% IV SOLN
2.0000 ug/min | INTRAVENOUS | Status: AC
  Filled 2020-11-19: qty 250

## 2020-11-19 MED ORDER — POTASSIUM CHLORIDE 2 MEQ/ML IV SOLN
80.0000 meq | INTRAVENOUS | Status: DC
  Filled 2020-11-19: qty 40

## 2020-11-19 MED ORDER — INSULIN REGULAR(HUMAN) IN NACL 100-0.9 UT/100ML-% IV SOLN
INTRAVENOUS | Status: AC
  Administered 2020-11-23: .9 [IU]/h via INTRAVENOUS
  Filled 2020-11-19: qty 100

## 2020-11-19 MED ORDER — SODIUM CHLORIDE 0.9 % IV SOLN
1.5000 g | INTRAVENOUS | Status: AC
  Administered 2020-11-23: 1.5 g via INTRAVENOUS
  Filled 2020-11-19: qty 1.5

## 2020-11-19 MED ORDER — DEXMEDETOMIDINE HCL IN NACL 400 MCG/100ML IV SOLN
0.1000 ug/kg/h | INTRAVENOUS | Status: AC
  Administered 2020-11-23: .5 ug/kg/h via INTRAVENOUS
  Filled 2020-11-19: qty 100

## 2020-11-19 MED ORDER — PHENYLEPHRINE HCL-NACL 20-0.9 MG/250ML-% IV SOLN
30.0000 ug/min | INTRAVENOUS | Status: DC
  Filled 2020-11-19: qty 250

## 2020-11-19 MED ORDER — TRANEXAMIC ACID (OHS) BOLUS VIA INFUSION
15.0000 mg/kg | INTRAVENOUS | Status: AC
  Administered 2020-11-23: 1500 mg via INTRAVENOUS
  Filled 2020-11-19: qty 1500

## 2020-11-19 MED ORDER — EPINEPHRINE HCL 5 MG/250ML IV SOLN IN NS
0.0000 ug/min | INTRAVENOUS | Status: DC
  Filled 2020-11-19: qty 250

## 2020-11-19 MED ORDER — TRANEXAMIC ACID 1000 MG/10ML IV SOLN
1.5000 mg/kg/h | INTRAVENOUS | Status: AC
  Filled 2020-11-19: qty 25

## 2020-11-19 MED ORDER — SODIUM CHLORIDE 0.9 % IV SOLN
INTRAVENOUS | Status: DC
  Filled 2020-11-19: qty 30

## 2020-11-19 MED ORDER — PLASMA-LYTE 148 IV SOLN
INTRAVENOUS | Status: DC
  Filled 2020-11-19: qty 2.5

## 2020-11-19 MED ORDER — TRANEXAMIC ACID (OHS) PUMP PRIME SOLUTION
2.0000 mg/kg | INTRAVENOUS | Status: DC
  Filled 2020-11-19: qty 2

## 2020-11-19 MED ORDER — MAGNESIUM SULFATE 50 % IJ SOLN
40.0000 meq | INTRAMUSCULAR | Status: DC
  Filled 2020-11-19: qty 9.85

## 2020-11-19 MED ORDER — VANCOMYCIN HCL 1500 MG/300ML IV SOLN
1500.0000 mg | INTRAVENOUS | Status: AC
  Administered 2020-11-23: 1500 mg via INTRAVENOUS
  Filled 2020-11-19: qty 300

## 2020-11-19 MED ORDER — NOREPINEPHRINE 4 MG/250ML-% IV SOLN
0.0000 ug/min | INTRAVENOUS | Status: DC
  Filled 2020-11-19: qty 250

## 2020-11-19 MED ORDER — SODIUM CHLORIDE 0.9 % IV SOLN
750.0000 mg | INTRAVENOUS | Status: AC
  Administered 2020-11-23: 750 mg via INTRAVENOUS
  Filled 2020-11-19: qty 750

## 2020-11-19 NOTE — Progress Notes (Signed)
PCP - Dr. Fulton Mole  Cardiologist - Dr.Henry Katrinka Blazing  Chest x-ray - 11/19/20  EKG - 11/19/20  Stress Test -   ECHO - 09/04/20  Cardiac Cath -   Sleep Study - NO CPAP - NO  LABS-CBC, CMP, PT, PTT, ABG, T/S, UA, PCR  ASA-na  ERAS-no  HA1C-11/19/20 Fasting Blood Sugar - na Checks Blood Sugar ___0__ times a day  Anesthesia-  Pt denies having chest pain, sob, or fever at this time. All instructions explained to the pt, with a verbal understanding of the material. Pt agrees to go over the instructions while at home for a better understanding. Pt also instructed to self quarantine after being tested for COVID-19. The opportunity to ask questions was provided.

## 2020-11-19 NOTE — Pre-Procedure Instructions (Signed)
Eric [name]  11/19/2020     Your procedure is scheduled on Monday, January 3.  Report to [facility], Main Entrance or Entrance "A" at :.5:30 AM                Your surgery or procedure is scheduled to begin at  8:30 AM   Call this number if you have problems the morning of surgery: [phone]  This is the number for the Pre- Surgical Desk.                For any other questions, please call [phone], Monday - Friday 8 AM - 4 PM.   Remember:  Do not eat or drink after midnight.  Take these medicines the morning of surgery with A SIP OF WATER: allpurinol (ZYLOPRIM) rosuvastatin (CRESTOR)  carvedilol (COREG)  If Needed:  acetaminophen (TYLENOL)  cetirizine (ZYRTEC)  STOP taking Aspirin, Aspirin Products (Goody Powder, Excedrin Migraine), Ibuprofen (Advil), Naproxen (Aleve), Vitamins and Herbal Products (ie Fish Oil).    Special instructions:    [facility]- Preparing For Surgery  Before surgery, you can play an important role. Because skin is not sterile, your skin needs to be as free of germs as possible. You can reduce the number of germs on your skin by washing with CHG (chlorahexidine gluconate) Soap before surgery.  CHG is an antiseptic cleaner which kills germs and bonds with the skin to continue killing germs even after washing.    Oral Hygiene is also important to reduce your risk of infection.  Remember - BRUSH YOUR TEETH THE MORNING OF SURGERY WITH YOUR REGULAR TOOTHPASTE  Please do not use if you have an allergy to CHG or antibacterial soaps. If your skin becomes reddened/irritated stop using the CHG.  Do no t shave (including legs and underarms) for at least 48 hours prior to first CHG shower. It is OK to shave your face.  Please follow these instructions carefully.   1. Shower the NIGHT BEFORE SURGERY and the MORNING OF SURGERY with CHG.   2. If you chose to wash your hair, wash your hair first as usual with your normal shampoo.  3. After  you shampoo, wash your face and private area with the soap you use at home, then rinse your hair and body thoroughly to remove the shampoo and soap.  4. Use CHG as you would any other liquid soap. You can apply CHG directly to the skin and wash gently with a scrungie or a clean washcloth.   5. Apply the CHG Soap to your body ONLY FROM THE NECK DOWN.  Do not use on open wounds or open sores. Avoid contact with your eyes, ears, mouth and genitals (private parts).   6. Wash thoroughly, paying special attention to the area where your surgery will be performed.  7. Thoroughly rinse your body with warm water from the neck down.  8. DO NOT shower/wash with your normal soap after using and rinsing off the CHG Soap.  9. Pat yourself dry with a CLEAN TOWEL.  10. Wear CLEAN PAJAMAS to bed the night before surgery, wear comfortable clothes the morning of surgery  11. Place CLEAN SHEETS on your bed the night of your first shower and DO NOT SLEEP WITH PETS.  Day of Surgery: Shower as instructed above. Do not apply any deodorants/lotions, powders or colognes.  Please wear clean clothes to the hospital/surgery center.   Remember to brush your teeth WITH YOUR REGULAR TOOTHPASTE.  Do not wear jewelry, make-up or nail polish.  Do not shave 48 hours prior to surgery.  Men may shave face and neck.  Do not bring valuables to the hospital.  [facility] is not responsible for any belongings or valuables.  Contacts, dentures or bridgework may not be worn into surgery.  Leave your suitcase in the car.  After surgery it may be brought to your room.  For patients admitted to the hospital, discharge time will be determined by your treatment team.  Patients discharged the day of surgery will not be allowed to drive home.   Please read over the fact sheets that you were given.

## 2020-11-19 NOTE — Progress Notes (Signed)
Pre-CABG (AVR) Dopplers completed. Refer to "CV Proc" under chart review to view preliminary results.  11/19/2020 3:06 PM Eula Fried., MHA, RVT, RDCS, RDMS

## 2020-11-22 NOTE — Anesthesia Preprocedure Evaluation (Addendum)
Anesthesia Evaluation  Patient identified by MRN, date of birth, ID band Patient awake    Reviewed: Allergy & Precautions, NPO status , Patient's Chart, lab work & pertinent test results, reviewed documented beta blocker date and time   History of Anesthesia Complications Negative for: history of anesthetic complications  Airway Mallampati: II  TM Distance: >3 FB Neck ROM: Full    Dental  (+) Dental Advisory Given   Pulmonary neg pulmonary ROS,  11/19/2020 SARS coronavirus NEG   breath sounds clear to auscultation       Cardiovascular hypertension, Pt. on medications and Pt. on home beta blockers (-) angina+CHF  + Valvular Problems/Murmurs (severe) AI  Rhythm:Regular Rate:Normal + Systolic murmurs and + Diastolic murmurs 87/5643 ECHO: EF 55-60%, mild LV dilation, mod LVH, grade 2 DD, mild MR, severe AI   Neuro/Psych negative neurological ROS     GI/Hepatic Neg liver ROS, GERD  Controlled,  Endo/Other  obese  Renal/GU Renal InsufficiencyRenal disease     Musculoskeletal   Abdominal (+) + obese,   Peds  Hematology negative hematology ROS (+)   Anesthesia Other Findings   Reproductive/Obstetrics                            Anesthesia Physical Anesthesia Plan  ASA: III  Anesthesia Plan: General   Post-op Pain Management:    Induction: Intravenous  PONV Risk Score and Plan: 2 and Treatment may vary due to age or medical condition  Airway Management Planned: Oral ETT  Additional Equipment: Arterial line, CVP, PA Cath and Ultrasound Guidance Line Placement  Intra-op Plan:   Post-operative Plan: Post-operative intubation/ventilation  Informed Consent: I have reviewed the patients History and Physical, chart, labs and discussed the procedure including the risks, benefits and alternatives for the proposed anesthesia with the patient or authorized representative who has indicated his/her  understanding and acceptance.     Dental advisory given  Plan Discussed with: CRNA and Surgeon  Anesthesia Plan Comments:        Anesthesia Quick Evaluation

## 2020-11-23 ENCOUNTER — Other Ambulatory Visit: Payer: Self-pay

## 2020-11-23 ENCOUNTER — Inpatient Hospital Stay (HOSPITAL_COMMUNITY): Payer: Managed Care, Other (non HMO) | Admitting: Vascular Surgery

## 2020-11-23 ENCOUNTER — Inpatient Hospital Stay (HOSPITAL_COMMUNITY): Payer: Managed Care, Other (non HMO)

## 2020-11-23 ENCOUNTER — Inpatient Hospital Stay (HOSPITAL_COMMUNITY): Payer: Managed Care, Other (non HMO) | Admitting: Anesthesiology

## 2020-11-23 ENCOUNTER — Encounter (HOSPITAL_COMMUNITY)
Admission: RE | Disposition: A | Discharge status: Home / Self Care | Payer: Self-pay | Source: Home / Self Care | Attending: Surgery

## 2020-11-23 ENCOUNTER — Inpatient Hospital Stay (HOSPITAL_COMMUNITY)
Admission: RE | Admit: 2020-11-23 | Discharge: 2020-11-28 | DRG: 220 | Disposition: A | Discharge status: Home / Self Care | Payer: Managed Care, Other (non HMO) | Attending: Surgery | Admitting: Surgery

## 2020-11-23 ENCOUNTER — Encounter (HOSPITAL_COMMUNITY): Payer: Self-pay | Admitting: Surgery

## 2020-11-23 DIAGNOSIS — Z79899 Other long term (current) drug therapy: Secondary | ICD-10-CM | POA: Diagnosis not present

## 2020-11-23 DIAGNOSIS — Z8249 Family history of ischemic heart disease and other diseases of the circulatory system: Secondary | ICD-10-CM

## 2020-11-23 DIAGNOSIS — I351 Nonrheumatic aortic (valve) insufficiency: Secondary | ICD-10-CM | POA: Diagnosis present

## 2020-11-23 DIAGNOSIS — N183 Chronic kidney disease, stage 3 unspecified: Secondary | ICD-10-CM | POA: Diagnosis present

## 2020-11-23 DIAGNOSIS — R609 Edema, unspecified: Secondary | ICD-10-CM | POA: Diagnosis present

## 2020-11-23 DIAGNOSIS — J302 Other seasonal allergic rhinitis: Secondary | ICD-10-CM | POA: Diagnosis present

## 2020-11-23 DIAGNOSIS — E876 Hypokalemia: Secondary | ICD-10-CM | POA: Diagnosis not present

## 2020-11-23 DIAGNOSIS — D696 Thrombocytopenia, unspecified: Secondary | ICD-10-CM | POA: Diagnosis not present

## 2020-11-23 DIAGNOSIS — I1310 Hypertensive heart and chronic kidney disease without heart failure, with stage 1 through stage 4 chronic kidney disease, or unspecified chronic kidney disease: Secondary | ICD-10-CM | POA: Diagnosis present

## 2020-11-23 DIAGNOSIS — Z09 Encounter for follow-up examination after completed treatment for conditions other than malignant neoplasm: Secondary | ICD-10-CM

## 2020-11-23 DIAGNOSIS — Z888 Allergy status to other drugs, medicaments and biological substances status: Secondary | ICD-10-CM | POA: Diagnosis not present

## 2020-11-23 DIAGNOSIS — K219 Gastro-esophageal reflux disease without esophagitis: Secondary | ICD-10-CM | POA: Diagnosis present

## 2020-11-23 DIAGNOSIS — D62 Acute posthemorrhagic anemia: Secondary | ICD-10-CM | POA: Diagnosis not present

## 2020-11-23 DIAGNOSIS — I350 Nonrheumatic aortic (valve) stenosis: Secondary | ICD-10-CM | POA: Diagnosis not present

## 2020-11-23 DIAGNOSIS — Z952 Presence of prosthetic heart valve: Secondary | ICD-10-CM

## 2020-11-23 HISTORY — PX: AORTIC VALVE REPLACEMENT: SHX41

## 2020-11-23 HISTORY — PX: TEE WITHOUT CARDIOVERSION: SHX5443

## 2020-11-23 LAB — POCT I-STAT 7, (LYTES, BLD GAS, ICA,H+H)
Acid-Base Excess: 1 mmol/L (ref 0.0–2.0)
Acid-base deficit: 3 mmol/L — ABNORMAL HIGH (ref 0.0–2.0)
Acid-base deficit: 3 mmol/L — ABNORMAL HIGH (ref 0.0–2.0)
Acid-base deficit: 3 mmol/L — ABNORMAL HIGH (ref 0.0–2.0)
Bicarbonate: 26.9 mmol/L (ref 20.0–28.0)
Bicarbonate: 22.7 mmol/L (ref 20.0–28.0)
Bicarbonate: 23.1 mmol/L (ref 20.0–28.0)
Bicarbonate: 23.3 mmol/L (ref 20.0–28.0)
Calcium, Ion: 1.07 mmol/L — ABNORMAL LOW (ref 1.15–1.40)
Calcium, Ion: 1.12 mmol/L — ABNORMAL LOW (ref 1.15–1.40)
Calcium, Ion: 1.2 mmol/L (ref 1.15–1.40)
Calcium, Ion: 1.16 mmol/L (ref 1.15–1.40)
HCT: 26 % — ABNORMAL LOW (ref 39.0–52.0)
HCT: 35 % — ABNORMAL LOW (ref 39.0–52.0)
HCT: 35 % — ABNORMAL LOW (ref 39.0–52.0)
HCT: 35 % — ABNORMAL LOW (ref 39.0–52.0)
Hemoglobin: 8.8 g/dL — ABNORMAL LOW (ref 13.0–17.0)
Hemoglobin: 11.9 g/dL — ABNORMAL LOW (ref 13.0–17.0)
Hemoglobin: 11.9 g/dL — ABNORMAL LOW (ref 13.0–17.0)
Hemoglobin: 11.9 g/dL — ABNORMAL LOW (ref 13.0–17.0)
O2 Saturation: 100 %
O2 Saturation: 96 %
O2 Saturation: 97 %
O2 Saturation: 96 %
Patient temperature: 35.8
Patient temperature: 36.7
Patient temperature: 36.7
Potassium: 3.9 mmol/L (ref 3.5–5.1)
Potassium: 4.5 mmol/L (ref 3.5–5.1)
Potassium: 4.9 mmol/L (ref 3.5–5.1)
Potassium: 4.9 mmol/L (ref 3.5–5.1)
Sodium: 137 mmol/L (ref 135–145)
Sodium: 139 mmol/L (ref 135–145)
Sodium: 139 mmol/L (ref 135–145)
Sodium: 138 mmol/L (ref 135–145)
TCO2: 28 mmol/L (ref 22–32)
TCO2: 24 mmol/L (ref 22–32)
TCO2: 24 mmol/L (ref 22–32)
TCO2: 25 mmol/L (ref 22–32)
pCO2 arterial: 46.4 mmHg (ref 32.0–48.0)
pCO2 arterial: 39.1 mmHg (ref 32.0–48.0)
pCO2 arterial: 43.8 mmHg (ref 32.0–48.0)
pCO2 arterial: 44.3 mmHg (ref 32.0–48.0)
pH, Arterial: 7.371 (ref 7.350–7.450)
pH, Arterial: 7.366 (ref 7.350–7.450)
pH, Arterial: 7.328 — ABNORMAL LOW (ref 7.350–7.450)
pH, Arterial: 7.328 — ABNORMAL LOW (ref 7.350–7.450)
pO2, Arterial: 295 mmHg — ABNORMAL HIGH (ref 83.0–108.0)
pO2, Arterial: 82 mmHg — ABNORMAL LOW (ref 83.0–108.0)
pO2, Arterial: 95 mmHg (ref 83.0–108.0)
pO2, Arterial: 86 mmHg (ref 83.0–108.0)

## 2020-11-23 LAB — POCT I-STAT, CHEM 8
BUN: 23 mg/dL (ref 8–23)
BUN: 24 mg/dL — ABNORMAL HIGH (ref 8–23)
BUN: 24 mg/dL — ABNORMAL HIGH (ref 8–23)
BUN: 23 mg/dL (ref 8–23)
BUN: 24 mg/dL — ABNORMAL HIGH (ref 8–23)
Calcium, Ion: 1.23 mmol/L (ref 1.15–1.40)
Calcium, Ion: 1.25 mmol/L (ref 1.15–1.40)
Calcium, Ion: 1.13 mmol/L — ABNORMAL LOW (ref 1.15–1.40)
Calcium, Ion: 1.06 mmol/L — ABNORMAL LOW (ref 1.15–1.40)
Calcium, Ion: 1.13 mmol/L — ABNORMAL LOW (ref 1.15–1.40)
Chloride: 104 mmol/L (ref 98–111)
Chloride: 104 mmol/L (ref 98–111)
Chloride: 101 mmol/L (ref 98–111)
Chloride: 101 mmol/L (ref 98–111)
Chloride: 102 mmol/L (ref 98–111)
Creatinine, Ser: 1.6 mg/dL — ABNORMAL HIGH (ref 0.61–1.24)
Creatinine, Ser: 1.6 mg/dL — ABNORMAL HIGH (ref 0.61–1.24)
Creatinine, Ser: 1.4 mg/dL — ABNORMAL HIGH (ref 0.61–1.24)
Creatinine, Ser: 1.4 mg/dL — ABNORMAL HIGH (ref 0.61–1.24)
Creatinine, Ser: 1.4 mg/dL — ABNORMAL HIGH (ref 0.61–1.24)
Glucose, Bld: 102 mg/dL — ABNORMAL HIGH (ref 70–99)
Glucose, Bld: 122 mg/dL — ABNORMAL HIGH (ref 70–99)
Glucose, Bld: 172 mg/dL — ABNORMAL HIGH (ref 70–99)
Glucose, Bld: 160 mg/dL — ABNORMAL HIGH (ref 70–99)
Glucose, Bld: 182 mg/dL — ABNORMAL HIGH (ref 70–99)
HCT: 30 % — ABNORMAL LOW (ref 39.0–52.0)
HCT: 31 % — ABNORMAL LOW (ref 39.0–52.0)
HCT: 29 % — ABNORMAL LOW (ref 39.0–52.0)
HCT: 26 % — ABNORMAL LOW (ref 39.0–52.0)
HCT: 32 % — ABNORMAL LOW (ref 39.0–52.0)
Hemoglobin: 10.2 g/dL — ABNORMAL LOW (ref 13.0–17.0)
Hemoglobin: 10.5 g/dL — ABNORMAL LOW (ref 13.0–17.0)
Hemoglobin: 9.9 g/dL — ABNORMAL LOW (ref 13.0–17.0)
Hemoglobin: 10.9 g/dL — ABNORMAL LOW (ref 13.0–17.0)
Hemoglobin: 8.8 g/dL — ABNORMAL LOW (ref 13.0–17.0)
Potassium: 3.8 mmol/L (ref 3.5–5.1)
Potassium: 3.9 mmol/L (ref 3.5–5.1)
Potassium: 4.9 mmol/L (ref 3.5–5.1)
Potassium: 5 mmol/L (ref 3.5–5.1)
Potassium: 5.3 mmol/L — ABNORMAL HIGH (ref 3.5–5.1)
Sodium: 139 mmol/L (ref 135–145)
Sodium: 141 mmol/L (ref 135–145)
Sodium: 136 mmol/L (ref 135–145)
Sodium: 136 mmol/L (ref 135–145)
Sodium: 138 mmol/L (ref 135–145)
TCO2: 24 mmol/L (ref 22–32)
TCO2: 25 mmol/L (ref 22–32)
TCO2: 26 mmol/L (ref 22–32)
TCO2: 24 mmol/L (ref 22–32)
TCO2: 24 mmol/L (ref 22–32)

## 2020-11-23 LAB — BASIC METABOLIC PANEL
Anion gap: 8 (ref 5–15)
BUN: 22 mg/dL (ref 8–23)
CO2: 22 mmol/L (ref 22–32)
Calcium: 8 mg/dL — ABNORMAL LOW (ref 8.9–10.3)
Chloride: 105 mmol/L (ref 98–111)
Creatinine, Ser: 1.59 mg/dL — ABNORMAL HIGH (ref 0.61–1.24)
GFR, Estimated: 48 mL/min — ABNORMAL LOW (ref >60)
Glucose, Bld: 128 mg/dL — ABNORMAL HIGH (ref 70–99)
Potassium: 4.9 mmol/L (ref 3.5–5.1)
Sodium: 135 mmol/L (ref 135–145)

## 2020-11-23 LAB — BLOOD GAS, ARTERIAL
Acid-base deficit: 0.7 mmol/L (ref 0.0–2.0)
Bicarbonate: 24.1 mmol/L (ref 20.0–28.0)
FIO2: 21
O2 Saturation: 97 %
Patient temperature: 37
pCO2 arterial: 44.1 mmHg (ref 32.0–48.0)
pH, Arterial: 7.356 (ref 7.350–7.450)
pO2, Arterial: 97.9 mmHg (ref 83.0–108.0)

## 2020-11-23 LAB — GLUCOSE, CAPILLARY
Glucose-Capillary: 120 mg/dL — ABNORMAL HIGH (ref 70–99)
Glucose-Capillary: 124 mg/dL — ABNORMAL HIGH (ref 70–99)
Glucose-Capillary: 124 mg/dL — ABNORMAL HIGH (ref 70–99)
Glucose-Capillary: 127 mg/dL — ABNORMAL HIGH (ref 70–99)
Glucose-Capillary: 138 mg/dL — ABNORMAL HIGH (ref 70–99)
Glucose-Capillary: 142 mg/dL — ABNORMAL HIGH (ref 70–99)
Glucose-Capillary: 144 mg/dL — ABNORMAL HIGH (ref 70–99)
Glucose-Capillary: 150 mg/dL — ABNORMAL HIGH (ref 70–99)
Glucose-Capillary: 163 mg/dL — ABNORMAL HIGH (ref 70–99)

## 2020-11-23 LAB — PLATELET COUNT: Platelets: 153 10*3/uL (ref 150–400)

## 2020-11-23 LAB — CBC
HCT: 34.7 % — ABNORMAL LOW (ref 39.0–52.0)
HCT: 35.4 % — ABNORMAL LOW (ref 39.0–52.0)
Hemoglobin: 12.1 g/dL — ABNORMAL LOW (ref 13.0–17.0)
Hemoglobin: 12.3 g/dL — ABNORMAL LOW (ref 13.0–17.0)
MCH: 25.5 pg — ABNORMAL LOW (ref 26.0–34.0)
MCH: 25.5 pg — ABNORMAL LOW (ref 26.0–34.0)
MCHC: 34.9 g/dL (ref 30.0–36.0)
MCHC: 34.7 g/dL (ref 30.0–36.0)
MCV: 73.2 fL — ABNORMAL LOW (ref 80.0–100.0)
MCV: 73.3 fL — ABNORMAL LOW (ref 80.0–100.0)
Platelets: 160 10*3/uL (ref 150–400)
Platelets: 158 10*3/uL (ref 150–400)
RBC: 4.74 MIL/uL (ref 4.22–5.81)
RBC: 4.83 MIL/uL (ref 4.22–5.81)
RDW: 13.7 % (ref 11.5–15.5)
RDW: 13.6 % (ref 11.5–15.5)
WBC: 16.8 10*3/uL — ABNORMAL HIGH (ref 4.0–10.5)
WBC: 16.9 10*3/uL — ABNORMAL HIGH (ref 4.0–10.5)
nRBC: 0 % (ref 0.0–0.2)
nRBC: 0 % (ref 0.0–0.2)

## 2020-11-23 LAB — POCT I-STAT EG7
Acid-Base Excess: 0 mmol/L (ref 0.0–2.0)
Bicarbonate: 25.3 mmol/L (ref 20.0–28.0)
Calcium, Ion: 1.1 mmol/L — ABNORMAL LOW (ref 1.15–1.40)
HCT: 26 % — ABNORMAL LOW (ref 39.0–52.0)
Hemoglobin: 8.8 g/dL — ABNORMAL LOW (ref 13.0–17.0)
O2 Saturation: 84 %
Potassium: 5.4 mmol/L — ABNORMAL HIGH (ref 3.5–5.1)
Sodium: 136 mmol/L (ref 135–145)
TCO2: 27 mmol/L (ref 22–32)
pCO2, Ven: 44.9 mmHg (ref 44.0–60.0)
pH, Ven: 7.359 (ref 7.250–7.430)
pO2, Ven: 51 mmHg — ABNORMAL HIGH (ref 32.0–45.0)

## 2020-11-23 LAB — HEMOGLOBIN AND HEMATOCRIT, BLOOD
HCT: 27.4 % — ABNORMAL LOW (ref 39.0–52.0)
Hemoglobin: 9.5 g/dL — ABNORMAL LOW (ref 13.0–17.0)

## 2020-11-23 LAB — PROTIME-INR
INR: 1.4 — ABNORMAL HIGH (ref 0.8–1.2)
Prothrombin Time: 16.8 seconds — ABNORMAL HIGH (ref 11.4–15.2)

## 2020-11-23 LAB — ECHO INTRAOPERATIVE TEE
AV Mean grad: 3 mmHg
Height: 70 in
Weight: 3527.99 oz

## 2020-11-23 LAB — APTT: aPTT: 31 seconds (ref 24–36)

## 2020-11-23 LAB — MAGNESIUM: Magnesium: 3.3 mg/dL — ABNORMAL HIGH (ref 1.7–2.4)

## 2020-11-23 SURGERY — REPLACEMENT, AORTIC VALVE, OPEN
Anesthesia: General | Site: Chest

## 2020-11-23 MED ORDER — PROPOFOL 10 MG/ML IV BOLUS
INTRAVENOUS | Status: DC | PRN
  Administered 2020-11-23: 40 mg via INTRAVENOUS
  Administered 2020-11-23: 20 mg via INTRAVENOUS
  Administered 2020-11-23: 50 mg via INTRAVENOUS
  Administered 2020-11-23: 30 mg via INTRAVENOUS

## 2020-11-23 MED ORDER — TRANEXAMIC ACID 1000 MG/10ML IV SOLN: INTRAVENOUS | Status: DC | PRN

## 2020-11-23 MED ORDER — MIDAZOLAM HCL (PF) 10 MG/2ML IJ SOLN
INTRAMUSCULAR | Status: AC
  Filled 2020-11-23: qty 2

## 2020-11-23 MED ORDER — SODIUM CHLORIDE 0.9% FLUSH
10.0000 mL | INTRAVENOUS | Status: DC | PRN
Start: 2020-11-23 — End: 2020-11-25

## 2020-11-23 MED ORDER — ACETAMINOPHEN 160 MG/5ML PO SOLN: 650.0000 mg | Freq: Once | ORAL | Status: AC

## 2020-11-23 MED ORDER — TRANEXAMIC ACID 1000 MG/10ML IV SOLN
INTRAVENOUS | Status: DC | PRN
  Administered 2020-11-23: 1.5 mg/kg/h via INTRAVENOUS

## 2020-11-23 MED ORDER — DOPAMINE-DEXTROSE 3.2-5 MG/ML-% IV SOLN: 2.0000 ug/kg/min | INTRAVENOUS | Status: DC

## 2020-11-23 MED ORDER — ROCURONIUM BROMIDE 10 MG/ML (PF) SYRINGE
PREFILLED_SYRINGE | INTRAVENOUS | Status: AC
  Filled 2020-11-23: qty 10

## 2020-11-23 MED ORDER — PHENYLEPHRINE HCL-NACL 20-0.9 MG/250ML-% IV SOLN
INTRAVENOUS | Status: DC | PRN
  Administered 2020-11-23 (×2): 15 ug/min via INTRAVENOUS

## 2020-11-23 MED ORDER — LACTATED RINGERS IV SOLN: 500.0000 mL | Freq: Once | INTRAVENOUS | Status: DC | PRN

## 2020-11-23 MED ORDER — SODIUM CHLORIDE 0.9 % IV SOLN
1.5000 g | Freq: Two times a day (BID) | INTRAVENOUS | Status: AC
  Administered 2020-11-23 – 2020-11-25 (×4): 1.5 g via INTRAVENOUS
  Filled 2020-11-23 (×3): qty 1.5

## 2020-11-23 MED ORDER — OXYCODONE HCL 5 MG PO TABS
5.0000 mg | ORAL_TABLET | ORAL | Status: DC | PRN
  Administered 2020-11-24: 10 mg via ORAL
  Administered 2020-11-24: 5 mg via ORAL
  Filled 2020-11-23: qty 2
  Filled 2020-11-23: qty 1

## 2020-11-23 MED ORDER — METOPROLOL TARTRATE 25 MG/10 ML ORAL SUSPENSION: 12.5000 mg | Freq: Two times a day (BID) | ORAL | Status: DC

## 2020-11-23 MED ORDER — ACETAMINOPHEN 160 MG/5ML PO SOLN
1000.0000 mg | Freq: Four times a day (QID) | ORAL | Status: DC
  Filled 2020-11-23: qty 40.6

## 2020-11-23 MED ORDER — SODIUM CHLORIDE 0.9% FLUSH
10.0000 mL | Freq: Two times a day (BID) | INTRAVENOUS | Status: DC
  Administered 2020-11-23 – 2020-11-24 (×3): 10 mL

## 2020-11-23 MED ORDER — INSULIN REGULAR(HUMAN) IN NACL 100-0.9 UT/100ML-% IV SOLN: INTRAVENOUS | Status: DC

## 2020-11-23 MED ORDER — ORAL CARE MOUTH RINSE
15.0000 mL | OROMUCOSAL | Status: DC
  Administered 2020-11-23: 15 mL via OROMUCOSAL

## 2020-11-23 MED ORDER — MAGNESIUM SULFATE 4 GM/100ML IV SOLN
4.0000 g | Freq: Once | INTRAVENOUS | Status: AC
  Administered 2020-11-23: 4 g via INTRAVENOUS
  Filled 2020-11-23: qty 100

## 2020-11-23 MED ORDER — MIDAZOLAM HCL 2 MG/2ML IJ SOLN
INTRAMUSCULAR | Status: AC
  Filled 2020-11-23: qty 2

## 2020-11-23 MED ORDER — CHLORHEXIDINE GLUCONATE 0.12% ORAL RINSE (MEDLINE KIT): 15.0000 mL | Freq: Two times a day (BID) | OROMUCOSAL | Status: DC

## 2020-11-23 MED ORDER — SODIUM CHLORIDE 0.9 % IV SOLN: INTRAVENOUS | Status: DC

## 2020-11-23 MED ORDER — CHLORHEXIDINE GLUCONATE CLOTH 2 % EX PADS
6.0000 | MEDICATED_PAD | Freq: Every day | CUTANEOUS | Status: DC
  Administered 2020-11-24: 6 via TOPICAL

## 2020-11-23 MED ORDER — DEXMEDETOMIDINE HCL IN NACL 400 MCG/100ML IV SOLN
0.0000 ug/kg/h | INTRAVENOUS | Status: DC
  Filled 2020-11-23: qty 100

## 2020-11-23 MED ORDER — LACTATED RINGERS IV SOLN: INTRAVENOUS | Status: DC

## 2020-11-23 MED ORDER — PROTAMINE SULFATE 10 MG/ML IV SOLN
INTRAVENOUS | Status: DC | PRN
  Administered 2020-11-23: 30 mg via INTRAVENOUS
  Administered 2020-11-23: 260 mg via INTRAVENOUS

## 2020-11-23 MED ORDER — BISACODYL 5 MG PO TBEC
10.0000 mg | DELAYED_RELEASE_TABLET | Freq: Every day | ORAL | Status: DC
  Administered 2020-11-24 – 2020-11-25 (×2): 10 mg via ORAL
  Filled 2020-11-23 (×2): qty 2

## 2020-11-23 MED ORDER — MIDAZOLAM HCL 2 MG/2ML IJ SOLN: 2.0000 mg | INTRAMUSCULAR | Status: DC | PRN

## 2020-11-23 MED ORDER — FAMOTIDINE IN NACL 20-0.9 MG/50ML-% IV SOLN
20.0000 mg | Freq: Two times a day (BID) | INTRAVENOUS | Status: AC
  Administered 2020-11-23 (×2): 20 mg via INTRAVENOUS
  Filled 2020-11-23 (×2): qty 50

## 2020-11-23 MED ORDER — PROTAMINE SULFATE 10 MG/ML IV SOLN
INTRAVENOUS | Status: AC
  Filled 2020-11-23: qty 25

## 2020-11-23 MED ORDER — ROSUVASTATIN CALCIUM 5 MG PO TABS
10.0000 mg | ORAL_TABLET | Freq: Every day | ORAL | Status: DC
  Administered 2020-11-24 – 2020-11-28 (×5): 10 mg via ORAL
  Filled 2020-11-23 (×5): qty 2

## 2020-11-23 MED ORDER — ORAL CARE MOUTH RINSE
15.0000 mL | Freq: Two times a day (BID) | OROMUCOSAL | Status: DC
  Administered 2020-11-23 – 2020-11-27 (×9): 15 mL via OROMUCOSAL

## 2020-11-23 MED ORDER — POTASSIUM CHLORIDE 10 MEQ/50ML IV SOLN
10.0000 meq | INTRAVENOUS | Status: AC
Start: 2020-11-23 — End: 2020-11-23

## 2020-11-23 MED ORDER — FENTANYL CITRATE (PF) 250 MCG/5ML IJ SOLN
INTRAMUSCULAR | Status: AC
  Filled 2020-11-23: qty 25

## 2020-11-23 MED ORDER — ACETAMINOPHEN 500 MG PO TABS
1000.0000 mg | ORAL_TABLET | Freq: Four times a day (QID) | ORAL | Status: DC
  Administered 2020-11-23 – 2020-11-25 (×6): 1000 mg via ORAL
  Filled 2020-11-23 (×6): qty 2

## 2020-11-23 MED ORDER — ASPIRIN 81 MG PO CHEW: 324.0000 mg | CHEWABLE_TABLET | Freq: Every day | ORAL | Status: DC

## 2020-11-23 MED ORDER — SODIUM CHLORIDE 0.9 % IV SOLN: 250.0000 mL | INTRAVENOUS | Status: DC

## 2020-11-23 MED ORDER — MORPHINE SULFATE (PF) 2 MG/ML IV SOLN
1.0000 mg | INTRAVENOUS | Status: DC | PRN
  Administered 2020-11-23: 1 mg via INTRAVENOUS
  Filled 2020-11-23: qty 1

## 2020-11-23 MED ORDER — SODIUM CHLORIDE 0.9 % IV SOLN: INTRAVENOUS | Status: DC | PRN

## 2020-11-23 MED ORDER — LACTATED RINGERS IV SOLN: INTRAVENOUS | Status: DC | PRN

## 2020-11-23 MED ORDER — DOCUSATE SODIUM 100 MG PO CAPS
200.0000 mg | ORAL_CAPSULE | Freq: Every day | ORAL | Status: DC
  Administered 2020-11-24 – 2020-11-25 (×2): 200 mg via ORAL
  Filled 2020-11-23 (×2): qty 2

## 2020-11-23 MED ORDER — THROMBIN 20000 UNITS EX KIT
PACK | CUTANEOUS | Status: DC | PRN
  Administered 2020-11-23: 20000 [IU] via TOPICAL

## 2020-11-23 MED ORDER — ROCURONIUM BROMIDE 10 MG/ML (PF) SYRINGE
PREFILLED_SYRINGE | INTRAVENOUS | Status: DC | PRN
  Administered 2020-11-23: 20 mg via INTRAVENOUS
  Administered 2020-11-23: 30 mg via INTRAVENOUS
  Administered 2020-11-23: 80 mg via INTRAVENOUS
  Administered 2020-11-23: 50 mg via INTRAVENOUS
  Administered 2020-11-23: 20 mg via INTRAVENOUS

## 2020-11-23 MED ORDER — ONDANSETRON HCL 4 MG/2ML IJ SOLN: 4.0000 mg | Freq: Four times a day (QID) | INTRAMUSCULAR | Status: DC | PRN

## 2020-11-23 MED ORDER — DEXTROSE 50 % IV SOLN
0.0000 mL | INTRAVENOUS | Status: DC | PRN
Start: 2020-11-23 — End: 2020-11-24

## 2020-11-23 MED ORDER — MIDAZOLAM HCL 5 MG/5ML IJ SOLN
INTRAMUSCULAR | Status: DC | PRN
  Administered 2020-11-23: 1 mg via INTRAVENOUS
  Administered 2020-11-23 (×6): 2 mg via INTRAVENOUS
  Administered 2020-11-23: 1 mg via INTRAVENOUS

## 2020-11-23 MED ORDER — CHLORHEXIDINE GLUCONATE 0.12 % MT SOLN
15.0000 mL | OROMUCOSAL | Status: AC
  Administered 2020-11-23: 15 mL via OROMUCOSAL

## 2020-11-23 MED ORDER — THROMBIN (RECOMBINANT) 20000 UNITS EX SOLR
CUTANEOUS | Status: AC
  Filled 2020-11-23: qty 20000

## 2020-11-23 MED ORDER — HEMOSTATIC AGENTS (NO CHARGE) OPTIME
TOPICAL | Status: DC | PRN
  Administered 2020-11-23 (×4): 1 via TOPICAL

## 2020-11-23 MED ORDER — DOPAMINE-DEXTROSE 3.2-5 MG/ML-% IV SOLN
INTRAVENOUS | Status: DC | PRN
  Administered 2020-11-23: 2 ug/kg/min via INTRAVENOUS

## 2020-11-23 MED ORDER — CHLORHEXIDINE GLUCONATE 0.12 % MT SOLN
15.0000 mL | Freq: Once | OROMUCOSAL | Status: AC
  Administered 2020-11-23: 15 mL via OROMUCOSAL
  Filled 2020-11-23: qty 15

## 2020-11-23 MED ORDER — SODIUM CHLORIDE 0.9% FLUSH: 3.0000 mL | INTRAVENOUS | Status: DC | PRN

## 2020-11-23 MED ORDER — HEPARIN SODIUM (PORCINE) 1000 UNIT/ML IJ SOLN
INTRAMUSCULAR | Status: AC
  Filled 2020-11-23: qty 1

## 2020-11-23 MED ORDER — PANTOPRAZOLE SODIUM 40 MG PO TBEC
40.0000 mg | DELAYED_RELEASE_TABLET | Freq: Every day | ORAL | Status: DC
  Administered 2020-11-25: 40 mg via ORAL
  Filled 2020-11-23: qty 1

## 2020-11-23 MED ORDER — METOPROLOL TARTRATE 12.5 MG HALF TABLET: 12.5000 mg | ORAL_TABLET | Freq: Once | ORAL | Status: DC

## 2020-11-23 MED ORDER — METOPROLOL TARTRATE 5 MG/5ML IV SOLN
2.5000 mg | INTRAVENOUS | Status: DC | PRN
Start: 2020-11-23 — End: 2020-11-25
  Administered 2020-11-23: 5 mg via INTRAVENOUS
  Filled 2020-11-23: qty 5

## 2020-11-23 MED ORDER — PROPOFOL 10 MG/ML IV BOLUS
INTRAVENOUS | Status: AC
  Filled 2020-11-23: qty 20

## 2020-11-23 MED ORDER — PLASMA-LYTE 148 IV SOLN
INTRAVENOUS | Status: DC | PRN
  Administered 2020-11-23: 500 mL via INTRAVASCULAR

## 2020-11-23 MED ORDER — EPHEDRINE 5 MG/ML INJ
INTRAVENOUS | Status: AC
  Filled 2020-11-23: qty 20

## 2020-11-23 MED ORDER — LIDOCAINE 2% (20 MG/ML) 5 ML SYRINGE
INTRAMUSCULAR | Status: AC
  Filled 2020-11-23: qty 5

## 2020-11-23 MED ORDER — NITROGLYCERIN 0.2 MG/ML ON CALL CATH LAB
INTRAVENOUS | Status: DC | PRN
  Administered 2020-11-23: 40 ug via INTRAVENOUS
  Administered 2020-11-23 (×2): 20 ug via INTRAVENOUS
  Administered 2020-11-23 (×3): 40 ug via INTRAVENOUS

## 2020-11-23 MED ORDER — PHENYLEPHRINE 40 MCG/ML (10ML) SYRINGE FOR IV PUSH (FOR BLOOD PRESSURE SUPPORT)
PREFILLED_SYRINGE | INTRAVENOUS | Status: AC
  Filled 2020-11-23: qty 20

## 2020-11-23 MED ORDER — PROTAMINE SULFATE 10 MG/ML IV SOLN
INTRAVENOUS | Status: AC
  Filled 2020-11-23: qty 5

## 2020-11-23 MED ORDER — ASPIRIN EC 325 MG PO TBEC
325.0000 mg | DELAYED_RELEASE_TABLET | Freq: Every day | ORAL | Status: DC
  Administered 2020-11-24 – 2020-11-25 (×2): 325 mg via ORAL
  Filled 2020-11-23 (×2): qty 1

## 2020-11-23 MED ORDER — PHENYLEPHRINE HCL-NACL 20-0.9 MG/250ML-% IV SOLN: 0.0000 ug/min | INTRAVENOUS | Status: DC

## 2020-11-23 MED ORDER — HEPARIN SODIUM (PORCINE) 1000 UNIT/ML IJ SOLN
INTRAMUSCULAR | Status: DC | PRN
  Administered 2020-11-23: 31000 [IU] via INTRAVENOUS

## 2020-11-23 MED ORDER — ALBUMIN HUMAN 5 % IV SOLN: 250.0000 mL | INTRAVENOUS | Status: DC | PRN

## 2020-11-23 MED ORDER — FENTANYL CITRATE (PF) 100 MCG/2ML IJ SOLN
INTRAMUSCULAR | Status: DC | PRN
  Administered 2020-11-23: 100 ug via INTRAVENOUS
  Administered 2020-11-23: 450 ug via INTRAVENOUS
  Administered 2020-11-23: 100 ug via INTRAVENOUS
  Administered 2020-11-23: 50 ug via INTRAVENOUS
  Administered 2020-11-23: 200 ug via INTRAVENOUS
  Administered 2020-11-23: 50 ug via INTRAVENOUS
  Administered 2020-11-23: 100 ug via INTRAVENOUS
  Administered 2020-11-23: 200 ug via INTRAVENOUS

## 2020-11-23 MED ORDER — CHLORHEXIDINE GLUCONATE 4 % EX LIQD: 30.0000 mL | CUTANEOUS | Status: DC

## 2020-11-23 MED ORDER — METOPROLOL TARTRATE 12.5 MG HALF TABLET
12.5000 mg | ORAL_TABLET | Freq: Two times a day (BID) | ORAL | Status: DC
  Administered 2020-11-23: 12.5 mg via ORAL
  Filled 2020-11-23: qty 1

## 2020-11-23 MED ORDER — CHLORHEXIDINE GLUCONATE CLOTH 2 % EX PADS
6.0000 | MEDICATED_PAD | Freq: Every day | CUTANEOUS | Status: DC
  Administered 2020-11-23: 6 via TOPICAL

## 2020-11-23 MED ORDER — PHENYLEPHRINE HCL-NACL 10-0.9 MG/250ML-% IV SOLN: INTRAVENOUS | Status: DC | PRN

## 2020-11-23 MED ORDER — NITROGLYCERIN IN D5W 200-5 MCG/ML-% IV SOLN
0.0000 ug/min | INTRAVENOUS | Status: DC
  Administered 2020-11-23: 5 ug/min via INTRAVENOUS

## 2020-11-23 MED ORDER — ACETAMINOPHEN 650 MG RE SUPP
650.0000 mg | Freq: Once | RECTAL | Status: AC
  Administered 2020-11-23: 650 mg via RECTAL

## 2020-11-23 MED ORDER — TRAMADOL HCL 50 MG PO TABS
50.0000 mg | ORAL_TABLET | ORAL | Status: DC | PRN
  Administered 2020-11-23 – 2020-11-24 (×2): 100 mg via ORAL
  Filled 2020-11-23 (×2): qty 2

## 2020-11-23 MED ORDER — SODIUM CHLORIDE 0.45 % IV SOLN: INTRAVENOUS | Status: DC | PRN

## 2020-11-23 MED ORDER — SODIUM CHLORIDE 0.9% FLUSH
3.0000 mL | Freq: Two times a day (BID) | INTRAVENOUS | Status: DC
  Administered 2020-11-24 – 2020-11-25 (×2): 3 mL via INTRAVENOUS

## 2020-11-23 MED ORDER — NITROGLYCERIN IN D5W 200-5 MCG/ML-% IV SOLN
INTRAVENOUS | Status: DC | PRN
  Administered 2020-11-23: 5 ug/min via INTRAVENOUS

## 2020-11-23 MED ORDER — BISACODYL 10 MG RE SUPP: 10.0000 mg | Freq: Every day | RECTAL | Status: DC

## 2020-11-23 MED ORDER — 0.9 % SODIUM CHLORIDE (POUR BTL) OPTIME
TOPICAL | Status: DC | PRN
  Administered 2020-11-23: 5000 mL

## 2020-11-23 MED ORDER — PHENYLEPHRINE 40 MCG/ML (10ML) SYRINGE FOR IV PUSH (FOR BLOOD PRESSURE SUPPORT)
PREFILLED_SYRINGE | INTRAVENOUS | Status: DC | PRN
  Administered 2020-11-23 (×4): 80 ug via INTRAVENOUS

## 2020-11-23 MED ORDER — VANCOMYCIN HCL IN DEXTROSE 1-5 GM/200ML-% IV SOLN
1000.0000 mg | Freq: Once | INTRAVENOUS | Status: AC
  Administered 2020-11-23: 1000 mg via INTRAVENOUS
  Filled 2020-11-23: qty 200

## 2020-11-23 SURGICAL SUPPLY — 76 items
ADAPTER CARDIO PERF ANTE/RETRO (ADAPTER) ×3 IMPLANT
BAG DECANTER FOR FLEXI CONT (MISCELLANEOUS) ×3 IMPLANT
BLADE CLIPPER SURG (BLADE) IMPLANT
BLADE STERNUM SYSTEM 6 (BLADE) ×3 IMPLANT
BLADE SURG 15 STRL LF DISP TIS (BLADE) ×2 IMPLANT
BLADE SURG 15 STRL SS (BLADE) ×3
CANISTER SUCT 3000ML PPV (MISCELLANEOUS) ×3 IMPLANT
CANNULA GUNDRY RCSP 15FR (MISCELLANEOUS) ×3 IMPLANT
CATH HEART VENT LEFT (CATHETERS) ×2 IMPLANT
CATH ROBINSON RED A/P 18FR (CATHETERS) ×9 IMPLANT
CATH THORACIC 36FR (CATHETERS) IMPLANT
CATH THORACIC 36FR RT ANG (CATHETERS) IMPLANT
CNTNR URN SCR LID CUP LEK RST (MISCELLANEOUS) ×2 IMPLANT
CONT SPEC 4OZ STRL OR WHT (MISCELLANEOUS) ×3
COVER SURGICAL LIGHT HANDLE (MISCELLANEOUS) IMPLANT
DRAPE CARDIOVASCULAR INCISE (DRAPES) ×3
DRAPE SLUSH/WARMER DISC (DRAPES) ×3 IMPLANT
DRAPE SRG 135X102X78XABS (DRAPES) ×2 IMPLANT
DRSG COVADERM 4X14 (GAUZE/BANDAGES/DRESSINGS) ×3 IMPLANT
ELECT CAUTERY BLADE 6.4 (BLADE) ×3 IMPLANT
ELECT REM PT RETURN 9FT ADLT (ELECTROSURGICAL) ×6
ELECTRODE REM PT RTRN 9FT ADLT (ELECTROSURGICAL) ×4 IMPLANT
FELT TEFLON 1X6 (MISCELLANEOUS) ×6 IMPLANT
GAUZE SPONGE 4X4 12PLY STRL (GAUZE/BANDAGES/DRESSINGS) ×3 IMPLANT
GAUZE SPONGE 4X4 12PLY STRL LF (GAUZE/BANDAGES/DRESSINGS) ×3 IMPLANT
GLOVE BIO SURGEON STRL SZ 6 (GLOVE) IMPLANT
GLOVE BIO SURGEON STRL SZ 6.5 (GLOVE) ×3 IMPLANT
GLOVE BIO SURGEON STRL SZ7 (GLOVE) IMPLANT
GLOVE BIO SURGEON STRL SZ7.5 (GLOVE) ×3 IMPLANT
GLOVE BIOGEL PI IND STRL 7.5 (GLOVE) ×2 IMPLANT
GLOVE BIOGEL PI INDICATOR 7.5 (GLOVE) ×1
GLOVE ECLIPSE 6.5 STRL STRAW (GLOVE) ×3 IMPLANT
GLOVE SURG SS PI 6.5 STRL IVOR (GLOVE) ×3 IMPLANT
GLOVE SURG UNDER POLY LF SZ6.5 (GLOVE) ×3 IMPLANT
GLOVE TRIUMPH SURG SIZE 7.0 (KITS) ×6 IMPLANT
GOWN STRL REUS W/ TWL LRG LVL3 (GOWN DISPOSABLE) ×10 IMPLANT
GOWN STRL REUS W/ TWL XL LVL3 (GOWN DISPOSABLE) ×6 IMPLANT
GOWN STRL REUS W/TWL LRG LVL3 (GOWN DISPOSABLE) ×15
GOWN STRL REUS W/TWL XL LVL3 (GOWN DISPOSABLE) ×9
HEMOSTAT POWDER SURGIFOAM 1G (HEMOSTASIS) ×9 IMPLANT
HEMOSTAT SURGICEL 2X14 (HEMOSTASIS) ×3 IMPLANT
KIT BASIN OR (CUSTOM PROCEDURE TRAY) ×3 IMPLANT
KIT CATH CPB BARTLE (MISCELLANEOUS) ×3 IMPLANT
KIT SUCTION CATH 14FR (SUCTIONS) ×3 IMPLANT
KIT TURNOVER KIT B (KITS) ×3 IMPLANT
LINE VENT (MISCELLANEOUS) ×3 IMPLANT
NS IRRIG 1000ML POUR BTL (IV SOLUTION) ×15 IMPLANT
PACK E OPEN HEART (SUTURE) ×3 IMPLANT
PACK OPEN HEART (CUSTOM PROCEDURE TRAY) ×3 IMPLANT
PAD ARMBOARD 7.5X6 YLW CONV (MISCELLANEOUS) ×6 IMPLANT
POSITIONER HEAD DONUT 9IN (MISCELLANEOUS) ×3 IMPLANT
SET CARDIOPLEGIA MPS 5001102 (MISCELLANEOUS) ×3 IMPLANT
SUT BONE WAX W31G (SUTURE) ×3 IMPLANT
SUT EB EXC GRN/WHT 2-0 V-5 (SUTURE) ×12 IMPLANT
SUT ETHIBON EXCEL 2-0 V-5 (SUTURE) ×6 IMPLANT
SUT ETHIBOND 2 0 SH (SUTURE) ×3
SUT ETHIBOND 2 0 SH 36X2 (SUTURE) ×2 IMPLANT
SUT ETHIBOND V-5 VALVE (SUTURE) ×9 IMPLANT
SUT PROLENE 3 0 SH DA (SUTURE) IMPLANT
SUT PROLENE 3 0 SH1 36 (SUTURE) ×3 IMPLANT
SUT PROLENE 4 0 RB 1 (SUTURE) ×9
SUT PROLENE 4-0 RB1 .5 CRCL 36 (SUTURE) ×6 IMPLANT
SUT SILK 2 0 SH CR/8 (SUTURE) ×3 IMPLANT
SUT STEEL 6MS V (SUTURE) IMPLANT
SUT VIC AB 1 CTX 36 (SUTURE) ×6
SUT VIC AB 1 CTX36XBRD ANBCTR (SUTURE) ×4 IMPLANT
SYSTEM SAHARA CHEST DRAIN ATS (WOUND CARE) ×3 IMPLANT
TAPE CLOTH SURG 4X10 WHT LF (GAUZE/BANDAGES/DRESSINGS) ×3 IMPLANT
TAPE PAPER 2X10 WHT MICROPORE (GAUZE/BANDAGES/DRESSINGS) ×3 IMPLANT
TOWEL GREEN STERILE (TOWEL DISPOSABLE) ×3 IMPLANT
TOWEL GREEN STERILE FF (TOWEL DISPOSABLE) ×3 IMPLANT
TRAY FOLEY SLVR 16FR TEMP STAT (SET/KITS/TRAYS/PACK) ×3 IMPLANT
UNDERPAD 30X36 HEAVY ABSORB (UNDERPADS AND DIAPERS) ×3 IMPLANT
VALVE AORTIC SZ25 INSP/RESIL (Prosthesis & Implant Heart) ×3 IMPLANT
VENT LEFT HEART 12002 (CATHETERS) ×3
WATER STERILE IRR 1000ML POUR (IV SOLUTION) ×6 IMPLANT

## 2020-11-23 NOTE — Anesthesia Postprocedure Evaluation (Signed)
Anesthesia Post Note  Patient: RJAY REVOLORIO  Procedure(s) Performed: AORTIC VALVE REPLACEMENT (AVR) USING EDWARDS INSPIRIS RESILIA 25 MM AORTIC VALVE (N/A Chest) TRANSESOPHAGEAL ECHOCARDIOGRAM (TEE) (N/A )     Patient location during evaluation: SICU Anesthesia Type: General Level of consciousness: sedated and patient remains intubated per anesthesia plan Pain management: pain level controlled Vital Signs Assessment: post-procedure vital signs reviewed and stable Respiratory status: patient remains intubated per anesthesia plan and patient on ventilator - see flowsheet for VS Cardiovascular status: stable Postop Assessment: no apparent nausea or vomiting Anesthetic complications: no   No complications documented.  Last Vitals:  Vitals:   11/23/20 1307 11/23/20 1315  BP:  101/76  Pulse: 80 80  Resp: 12 (!) 5  Temp: (!) 35.7 C (!) 35.8 C  SpO2: 100% 100%    Last Pain:  Vitals:   11/23/20 0628  TempSrc: Oral  PainSc:                  Nic Lampe,E. Charne Mcbrien

## 2020-11-23 NOTE — Progress Notes (Signed)
TCTS Evening Rounds  DOS s/p AVR Extubated Hemodyn stable BP 124/83   Pulse 80   Temp 98.24 F (36.8 C)   Resp 13   Ht 5\' 10"  (1.778 m)   Wt 100 kg   SpO2 96%   BMI 31.64 kg/m  arousable Little CT output  Intake/Output Summary (Last 24 hours) at 11/23/2020 1730 Last data filed at 11/23/2020 1700 Gross per 24 hour  Intake 3641.7 ml  Output 3936 ml  Net -294.3 ml    A/p: continue early routine postoperative care Krystofer Hevener Z. 01/21/2021, MD [phone]

## 2020-11-23 NOTE — Anesthesia Procedure Notes (Signed)
Central Venous Catheter Insertion Performed by: Kipp Brood, MD, anesthesiologist Start/End1/01/2021 7:20 AM, 11/23/2020 7:30 AM Patient location: Pre-op. Preanesthetic checklist: patient identified, IV checked, site marked, risks and benefits discussed, surgical consent, monitors and equipment checked, pre-op evaluation, timeout performed and anesthesia consent Hand hygiene performed  and maximum sterile barriers used  PA cath was placed.Swan type:thermodilution Procedure performed without using ultrasound guided technique. Attempts: 1 Following insertion, line sutured, dressing applied and Biopatch. Patient tolerated the procedure well with no immediate complications. Additional procedure comments: Inserted to 48 cm at skin.

## 2020-11-23 NOTE — Anesthesia Procedure Notes (Signed)
Procedure Name: Intubation Date/Time: 11/23/2020 8:29 AM Performed by: Rande Brunt, CRNA Pre-anesthesia Checklist: Patient identified, Emergency Drugs available, Suction available and Patient being monitored Patient Re-evaluated:Patient Re-evaluated prior to induction Oxygen Delivery Method: Circle System Utilized Preoxygenation: Pre-oxygenation with 100% oxygen Induction Type: IV induction Ventilation: Mask ventilation without difficulty Laryngoscope Size: Mac and 4 Grade View: Grade I Tube type: Oral Tube size: 8.0 mm Number of attempts: 1 Airway Equipment and Method: Stylet and Oral airway Placement Confirmation: ETT inserted through vocal cords under direct vision,  positive ETCO2 and breath sounds checked- equal and bilateral Secured at: 24 cm Tube secured with: Tape Dental Injury: Teeth and Oropharynx as per pre-operative assessment

## 2020-11-23 NOTE — Procedures (Signed)
Extubation Procedure Note  Patient Details:   Name: Eric [name] DOB: [birth year] MRN: [id number]   Airway Documentation:    Vent end date: 11/23/20 Vent end time: 9672   Evaluation  O2 sats: stable throughout Complications: No apparent complications Patient did tolerate procedure well. Bilateral Breath Sounds: Clear,Diminished   Patient extubated per SICU protocol & placed on 4L [place]. Patient met all weaning parameters prior to extubation. NIF -30, VC- 1.2L, positive cuff leak. Patient able to speak & cough post extubation. IS instructed 750x5.  Kathie Dike 11/23/2020, 5:22 PM

## 2020-11-23 NOTE — Transfer of Care (Signed)
Immediate Anesthesia Transfer of Care Note  Patient: Eric [name]  Procedure(s) Performed: AORTIC VALVE REPLACEMENT (AVR) USING EDWARDS INSPIRIS RESILIA 25 MM AORTIC VALVE (N/A Chest) TRANSESOPHAGEAL ECHOCARDIOGRAM (TEE) (N/A )  Patient Location: ICU  Anesthesia Type:General  Level of Consciousness: Patient remains intubated per anesthesia plan  Airway & Oxygen Therapy: Patient remains intubated per anesthesia plan and Patient placed on Ventilator (see vital sign flow sheet for setting)  Post-op Assessment: Report given to RN and Post -op Vital signs reviewed and stable  Post vital signs: Reviewed and stable  Last Vitals:  Vitals Value Taken Time  BP 102/54   Temp    Pulse 80   Resp 14   SpO2 97     Last Pain:  Vitals:   11/23/20 0628  TempSrc: Oral  PainSc:       Patients Stated Pain Goal: 4 (11/23/20 7616)  Complications: No complications documented.

## 2020-11-23 NOTE — Op Note (Signed)
CARDIOVASCULAR SURGERY OPERATIVE NOTE  11/23/2020 Eric [name] [id number]  Surgeon:  Alleen Borne, MD  First Assistant: Gershon Crane,  PA-C   Preoperative Diagnosis:  Severe aortic insufficiency   Postoperative Diagnosis:  Same   Procedure:  1. Median Sternotomy 2. Extracorporeal circulation 3.   Aortic valve replacement using a 25 mm Edwards INSPIRIS RESILIA pericardial valve.  Anesthesia:  General Endotracheal   Clinical History/Surgical Indication:  This 65 year old gentleman has stage C, asymptomatic, severe aortic insufficiency with a trileaflet aortic valve with thickened leaflets and leaflet noncoaptation. The aortic root and ascending aorta are minimally enlarged with a maximum diameter of the ascending aorta at 40 mm. I do not think there is any indication for replacing his aorta especially with a trileaflet aortic valve. I recommended that he undergo aortic valve replacement. This should be performed to prevent progressive left ventricular deterioration and development of symptoms of congestive heart failure. I reviewed the CTA study with the patient and his wife and answered their questions. We discussed the alternatives of mechanical and bioprosthetic valves and decided on using a bioprosthetic bovine pericardial valve. I discussed the operative procedure with the patient and his wife including alternatives, benefits and risks; including but not limited to bleeding, blood transfusion, infection, stroke, myocardial infarction, graft failure, heart block requiring a permanent pacemaker, organ dysfunction, and death.  Eric [name] understands and agrees to proceed.    Preparation:  The patient was seen in the preoperative holding area and the correct patient, correct operation were confirmed with the patient after reviewing the medical record and catheterization. The consent was signed by me. Preoperative antibiotics were given. A pulmonary arterial line and radial  arterial line were placed by the anesthesia team. The patient was taken back to the operating room and positioned supine on the operating room table. After being placed under general endotracheal anesthesia by the anesthesia team a foley catheter was placed. The neck, chest, abdomen, and both legs were prepped with betadine soap and solution and draped in the usual sterile manner. A surgical time-out was taken and the correct patient and operative procedure were confirmed with the nursing and anesthesia staff.   Pre-bypass TEE:   Complete TEE assessment was performed by Dr. Jairo Ben. This showed severe AI with lack of central coaptation. LVEF 40-45% with some LV dilatation.    [name]-bypass TEE:   Normal functioning prosthetic aortic valve with no perivalvular leak or regurgitation through the valve. Mean gradient 2 mm Hg, peak 4 mm Hg.  Left ventricular function unchanged but cavity smaller. Trivial mitral regurgitation.    Cardiopulmonary Bypass:  A median sternotomy was performed. The pericardium was opened in the midline. Right ventricular function appeared normal. The ascending aorta was of normal size and had no palpable plaque. There were no contraindications to aortic cannulation or cross-clamping. The patient was fully systemically heparinized and the ACT was maintained > 400 sec. The proximal aortic arch was cannulated with a 20 F aortic cannula for arterial inflow. Venous cannulation was performed via the right atrial appendage using a two-staged venous cannula. An antegrade cardioplegia/vent cannula was inserted into the mid-ascending aorta. A left ventricular vent was placed via the right superior pulmonary vein. A retrograde cardioplegia cannnula was placed into the coronary sinus via the right atrium. Aortic occlusion was performed with a single cross-clamp. Systemic cooling to 32 degrees Centigrade and topical cooling of the heart with iced saline were used. Hyperkalemic  retrograde cold blood cardioplegia was used to  induce diastolic arrest and then given at about 20 minute intervals throughout the period of arrest to maintain myocardial temperature at or below 10 degrees centigrade. Antegrade was not given due to severe AI. A temperature probe was inserted into the interventricular septum and an insulating pad was placed in the pericardium. Carbon dioxide was insufflated into the pericardium at 5L/min throughout the procedure to minimize intracardiac air.   Aortic Valve Replacement:   A transverse aortotomy was performed 1 cm above the take-off of the right coronary artery. The native valve was trileaflet with thickened leaflets, malformation of the leaflets particularly at the edges and no annular calcification. It looked like a rheumatic valve. The ostia of the coronary arteries were in normal position and were not obstructed. The native valve leaflets were excised. Care was taken to remove all particulate debris. The left ventricle was directly inspected for debris and then irrigated with ice saline solution. The annulus was sized and a size 25 mm Edwards INSPIRIS RESILIA valve was chosen. The model number was [id number] and the serial number was [id number]. While the valve was being prepared 2-0 Ethibond pledgeted horizontal mattress sutures were placed around the annulus with the pledgets in a sub-annular position. The sutures were placed through the sewing ring and the valve lowered into place. The sutures were tied sequentially. The valve seated nicely and the coronary ostia were not obstructed. The prosthetic valve leaflets moved normally and there was no sub-valvular obstruction. The aortotomy was closed using 4-0 Prolene suture in 2 layers with felt strips to reinforce the closure.  Completion:  The patient was rewarmed to 37 degrees Centigrade. De-airing maneuvers were performed and the head placed in trendelenburg position. The crossclamp was removed with a time of 73  minutes. There was spontaneous return of sinus rhythm. The aortotomy was checked for hemostasis. Two temporary epicardial pacing wires were placed on the right atrium and two on the right ventricle. The left ventricular vent and retrograde cardioplegia cannulas were removed. The patient was weaned from CPB without difficulty on dopamine 2 mcg which had been started on pump for CKD.  CPB time was 95 minutes. Cardiac output was 5.5 LPM. Heparin was fully reversed with protamine and the aortic and venous cannulas removed. Hemostasis was achieved. Mediastinal drainage tubes were placed. The sternum was closed with double #6 stainless steel wires. The fascia was closed with continuous # 1 vicryl suture. The subcutaneous tissue was closed with 2-0 vicryl continuous suture. The skin was closed with 3-0 vicryl subcuticular suture. All sponge, needle, and instrument counts were reported correct at the end of the case. Dry sterile dressings were placed over the incisions and around the chest tubes which were connected to pleurevac suction. The patient was then transported to the surgical intensive care unit in stable condition.

## 2020-11-23 NOTE — Progress Notes (Signed)
ET tube pulled back by [facility] in regards to chest x-ray. Bartle MD aware.

## 2020-11-23 NOTE — H&P (Signed)
[address]       Eric [name],[place] [id number]             [phone]      Cardiothoracic Surgery History and Physical   Referring Provider is Eric RecordsSmith, Henry W, MD Primary Cardiologist is Eric NoeHenry [name] Smith III, MD PCP is Eric Elseeade, Robert, MD      Chief Complaint  Patient presents with  . Aortic Insuffiency        HPI:  The patient is a 65 year old gentleman with history of hypertension, a long history of stage [name] chronic kidney disease with a creatinine of 1.67 in 2016 and 1.86 a year ago, left ventricular hypertrophy, and severe aortic insufficiency that has been followed by Dr. Katrinka [name] with periodic echocardiograms.  His most recent echocardiogram on 09/04/2020 showed a left ventricular ejection fraction of 55 to 60% with moderate LVH.  There is grade 2 diastolic dysfunction.  Left ventricular diastolic internal diameter was 5.8 cm and systolic was 4.1 cm.  This has been stable over the past year.  The aortic valve appears trileaflet with severe regurgitation and a pressure half-time of 199 ms.  The ascending aorta was measured at 4.2 cm on his current echo and was measured at 3.8 cm on his prior echo in April 2021.  He is married and lives with his wife.  He denies any symptoms of exertional shortness of breath or fatigue.  He has not been exercising much.  He has had no chest pain or pressure.  He denies dizziness and syncope.  He said no orthopnea or PND.  Denies peripheral edema.  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      Past Medical History:  Diagnosis Date  . Allergy    ALLERGIC RHINITIS..WORSE SPRING/FALL  . Aortic valve regurgitation 07/10/14   ECHO @ [facility]  . GERD (gastroesophageal reflux disease)    CURRENTLY DIET CONTROLLED  . Hypertension   . Sinusitis, chronic          Past Surgical History:  Procedure Laterality Date  . KNEE ARTHROSCOPY Bilateral          Family History  Problem Relation Age of Onset   . Hypertension Mother   . Cancer Father        STOMACH  . Diabetes Daughter   . Cancer Maternal Grandmother        STOMACH  . Heart disease Maternal Grandmother   . Cancer Brother        COLON  . Diabetes Brother     Social History        Socioeconomic History  . Marital status: Married    Spouse name: Eric CoasterGina  . Number of children: 3  . Years of education: 3518  . Highest education level: Master's degree (e.g., MA, MS, MEng, MEd, MSW, MBA)  Occupational History  . Occupation: Art gallery managerengineer  Tobacco Use  . Smoking status: Never Smoker  . Smokeless tobacco: Never Used  Vaping Use  . Vaping Use: Never used  Substance and Sexual Activity  . Alcohol use: Yes    Alcohol/week: 0.0 standard drinks    Comment: occasional  . Drug use: No  . Sexual activity: Yes  Other Topics Concern  . Not on file  Social History Narrative  . Not on file   Social Determinants of Health      Financial Resource Strain:   . Difficulty of Paying Living Expenses: Not on file  Food Insecurity:   .  Worried About Programme researcher, broadcasting/film/video in the Last Year: Not on file  . Ran Out of Food in the Last Year: Not on file  Transportation Needs:   . Lack of Transportation (Medical): Not on file  . Lack of Transportation (Non-Medical): Not on file  Physical Activity:   . Days of Exercise per Week: Not on file  . Minutes of Exercise per Session: Not on file  Stress:   . Feeling of Stress : Not on file  Social Connections:   . Frequency of Communication with Friends and Family: Not on file  . Frequency of Social Gatherings with Friends and Family: Not on file  . Attends Religious Services: Not on file  . Active Member of Clubs or Organizations: Not on file  . Attends Banker Meetings: Not on file  . Marital Status: Not on file  Intimate Partner Violence:   . Fear of Current or Ex-Partner: Not on file  . Emotionally Abused: Not on file  . Physically Abused: Not on file  .  Sexually Abused: Not on file          Current Outpatient Medications  Medication Sig Dispense Refill  . acetaminophen (TYLENOL) 325 MG tablet Take 650 mg by mouth every 6 (six) hours as needed for mild pain or headache.    . allopurinol (ZYLOPRIM) 300 MG tablet Take 300 mg by mouth daily.  4  . cetirizine (ZYRTEC) 10 MG tablet Take 10 mg by mouth as needed for allergies.    . hydrochlorothiazide (HYDRODIURIL) 25 MG tablet TAKE 1/2 TABLET(12.5 MG) BY MOUTH DAILY 45 tablet 3  . rosuvastatin (CRESTOR) 10 MG tablet Take 1 tablet (10 mg total) by mouth daily. 90 tablet 3  . carvedilol (COREG) 3.125 MG tablet Take 1 tablet (3.125 mg total) by mouth 2 (two) times daily. 180 tablet 3   No current facility-administered medications for this visit.        Allergies  Allergen Reactions  . Amlodipine Cough      Review of Systems:              General:                      normal appetite, normal energy, no weight gain, no weight loss, no fever             Cardiac:                       no chest pain with exertion, no chest pain at rest, noSOB with  exertion, no resting SOB, no PND, no orthopnea, no palpitations, no arrhythmia, no atrial fibrillation, no LE edema, no dizzy spells, no syncope             Respiratory:                 no shortness of breath, no home oxygen, no productive cough, no dry cough, no bronchitis, no wheezing, no hemoptysis, no asthma, no pain with inspiration or cough, no sleep apnea, no CPAP at night             GI:                               no difficulty swallowing, no reflux, no frequent heartburn, no hiatal hernia, no abdominal pain, no constipation, no diarrhea, no hematochezia, no hematemesis, no melena  GU:                              no dysuria,  no frequency, no urinary tract infection, no hematuria, no enlarged prostate, no kidney stones, + chronic kidney disease             Vascular:                     no pain suggestive of  claudication, no pain in feet, no leg cramps, no varicose veins, no DVT, no non-healing foot ulcer             Neuro:                         no stroke, no TIA's, no seizures, no headaches, no temporary blindness one eye,  no slurred speech, no peripheral neuropathy, no chronic pain, no instability of gait, no memory/cognitive dysfunction             Musculoskeletal:         no arthritis, no joint swelling, no myalgias, no difficulty walking, normal mobility              Skin:                            no rash, no itching, no skin infections, no pressure sores or ulcerations             Psych:                         no anxiety, no depression, no nervousness, no unusual recent stress             Eyes:                           no blurry vision, no floaters, no recent vision changes, does not wear glasses or contacts             ENT:                            no hearing loss, no loose or painful teeth, no dentures, last saw dentist past year             Hematologic:               no easy bruising, no abnormal bleeding, no clotting disorder, no frequent epistaxis             Endocrine:                   no diabetes, does not check CBG's at home                            Physical Exam:              BP (!) 162/69 (BP Location: Right Arm, Patient Position: Sitting)   Pulse 63   Resp 18   Ht 5\' 10"  (1.778 m)   Wt 216 lb (98 kg)   SpO2 95% Comment: RA with mask on  BMI 30.99 kg/m              General:  well-appearing             HEENT:                       Unremarkable, NCAT, PERLA, EOMI             Neck:                           no JVD, no bruits, no adenopathy              Chest:                          clear to auscultation, symmetrical breath sounds, no wheezes, no rhonchi              CV:                              RRR, grade ll/VI crescendo/decrescendo murmur heard best at RSB, lll/IV diastolic murmur LLSB             Abdomen:                    soft,  non-tender, no masses              Extremities:                 warm, well-perfused, pulses palpable at ankle, no LE edema             Rectal/GU                   Deferred             Neuro:                         Grossly non-focal and symmetrical throughout             Skin:                            Clean and dry, no rashes, no breakdown   Diagnostic Tests:  ECHOCARDIOGRAM REPORT       Patient Name:  ALEKSA CATTERTON Date of Exam: 09/04/2020  Medical Rec #: [id number]   Height:    70.0 in  Accession #:  [id number]  Weight:    220.4 lb  Date of Birth: 23-Dec-1955   BSA:     2.175 m  Patient Age:  64 years   BP:      124/54 mmHg  Patient Gender: M       HR:      87 bpm.  Exam Location: [address]   Procedure: 2D Echo, 3D Echo, Cardiac Doppler, Color Doppler and Strain  Analysis   Indications:  I35.1 Aortic Regurgitation    History:    Patient has prior history of Echocardiogram examinations,  most         recent 02/28/2020. Risk Factors:Hypertension. History of  COVID 19.         Benign hypertensive heart and CKD.    Sonographer:  Garald Braver, RDCS  Referring Phys: [phone] Barry Dienes [facility]   IMPRESSIONS    1. Left ventricular ejection fraction, by estimation, is 55 to 60%. The  left ventricle has normal function. The left ventricle has  no regional  wall motion abnormalities. The left ventricular internal cavity size was  mildly dilated. There is moderate  left ventricular hypertrophy. Left ventricular diastolic parameters are  consistent with Grade II diastolic dysfunction (pseudonormalization).  2. Right ventricular systolic function is normal. The right ventricular  size is normal. Tricuspid regurgitation signal is inadequate for assessing  PA pressure.  3. The mitral valve is normal in structure. Mild mitral valve  regurgitation. No evidence of mitral stenosis.  4. The aortic valve is  tricuspid. Aortic valve regurgitation is severe  with pressure halftime 199 msec. No aortic stenosis is present. Aortic  valve mean gradient measures 11.0 mmHg, suspect this is due to high flow  in setting of severe AI rather than  true AS.  5. Aortic dilatation noted. There is mild dilatation of the ascending  aorta, measuring 42 mm.  6. The inferior vena cava is normal in size with greater than 50%  respiratory variability, suggesting right atrial pressure of 3 mmHg.   Comparison(s): 02/28/20 EF 55-60%. Severe AI.   FINDINGS  Left Ventricle: Left ventricular ejection fraction, by estimation, is 55  to 60%. The left ventricle has normal function. The left ventricle has no  regional wall motion abnormalities. The left ventricular internal cavity  size was mildly dilated. There is  moderate left ventricular hypertrophy. Left ventricular diastolic  parameters are consistent with Grade II diastolic dysfunction  (pseudonormalization).   Right Ventricle: The right ventricular size is normal. No increase in  right ventricular wall thickness. Right ventricular systolic function is  normal. Tricuspid regurgitation signal is inadequate for assessing PA  pressure.   Left Atrium: Left atrial size was normal in size.   Right Atrium: Right atrial size was normal in size.   Pericardium: Trivial pericardial effusion is present.   Mitral Valve: The mitral valve is normal in structure. Mild mitral valve  regurgitation. No evidence of mitral valve stenosis.   Tricuspid Valve: The tricuspid valve is normal in structure. Tricuspid  valve regurgitation is trivial.   Aortic Valve: The aortic valve is tricuspid. Aortic valve regurgitation is  severe. Aortic regurgitation PHT measures 199 msec. No aortic stenosis is  present. Aortic valve mean gradient measures 11.0 mmHg. Aortic valve peak  gradient measures 24.6 mmHg.  Aortic valve area, by VTI measures 3.66 cm.   Pulmonic Valve: The pulmonic  valve was normal in structure. Pulmonic valve  regurgitation is not visualized.   Aorta: Aortic dilatation noted. There is mild dilatation of the ascending  aorta, measuring 42 mm.   Venous: The inferior vena cava is normal in size with greater than 50%  respiratory variability, suggesting right atrial pressure of 3 mmHg.   IAS/Shunts: No atrial level shunt detected by color flow Doppler.     LEFT VENTRICLE  PLAX 2D  LVIDd:     5.80 cm Diastology  LVIDs:     4.10 cm LV e' medial:  5.22 cm/s  LV PW:     1.20 cm LV E/e' medial: 12.2  LV IVS:    1.20 cm LV e' lateral:  9.25 cm/s  LVOT diam:   2.60 cm LV E/e' lateral: 6.9  LV SV:     166  LV SV Index:  76    2D Longitudinal Strain  LVOT Area:   5.31 cm 2D Strain GLS (A2C):  -21.1 %             2D Strain GLS (A3C):  -16.5 %  2D Strain GLS (A4C):  -21.1 %             2D Strain GLS Avg:   -19.6 %               3D Volume EF:             3D EF:    55 %             LV EDV:    270 ml             LV ESV:    122 ml             LV SV:    149 ml   RIGHT VENTRICLE  RV Basal diam: 3.50 cm  RV S prime:   18.80 cm/s  TAPSE (M-mode): 2.7 cm   LEFT ATRIUM       Index    RIGHT ATRIUM      Index  LA diam:    4.40 cm 2.02 cm/m RA Area:   21.20 cm  LA Vol (A2C):  52.4 ml 24.09 ml/m RA Volume:  60.40 ml 27.77 ml/m  LA Vol (A4C):  64.6 ml 29.70 ml/m  LA Biplane Vol: 62.0 ml 28.50 ml/m  AORTIC VALVE  AV Area (Vmax):  2.93 cm  AV Area (Vmean):  3.29 cm  AV Area (VTI):   3.66 cm  AV Vmax:      248.00 cm/s  AV Vmean:     151.000 cm/s  AV VTI:      0.452 m  AV Peak Grad:   24.6 mmHg  AV Mean Grad:   11.0 mmHg  LVOT Vmax:     137.00 cm/s  LVOT Vmean:    93.500 cm/s  LVOT VTI:     0.312 m  LVOT/AV  VTI ratio: 0.69  AI PHT:      199 msec    AORTA  Ao Root diam: 3.90 cm  Ao Asc diam: 4.05 cm   MITRAL VALVE  MV Area (PHT):       SHUNTS  MV Decel Time: 243 msec  Systemic VTI: 0.31 m  MV E velocity: 63.80 cm/s Systemic Diam: 2.60 cm  MV A velocity: 62.60 cm/s  MV E/A ratio: 1.02   Marca Ancona MD  Electronically signed by Marca Ancona MD  Signature Date/Time: 09/04/2020/3:26:39 PM      Final    ADDENDUM REPORT: 10/28/2020 13:15  CLINICAL DATA:  65 year old male with h/o severe aortic insufficiency scheduled for AVR.  EXAM: Cardiac/Coronary  CTA  TECHNIQUE: The patient was scanned on a [employer].  FINDINGS: A 100 kV prospective scan was triggered in the descending thoracic aorta at 111 HU's. Axial non-contrast 3 mm slices were carried out through the heart. The data set was analyzed on a dedicated work station and scored using the Agatson method. Gantry rotation speed was 250 msecs and collimation was .6 mm. No beta blockade and 0.8 mg of sl NTG was given. The 3D data set was reconstructed in 5% intervals of the 67-82 % of the R-R cycle. Diastolic phases were analyzed on a dedicated work station using MPR, MIP and VRT modes. The patient received 80 cc of contrast.  Aorta: Upper normal size of the ascending aorta with maximum diameter 40 mm. Minimal atherosclerotic plaque and calcifications. No dissection.  Sinotubular Junction: 34 x 34 mm  Ascending Thoracic Aorta: 40 x 39 mm  Aortic Arch: 31 x 29 mm  Descending Thoracic  Aorta: 30 x 39 mm  Sinus of Valsalva Measurements:  Non-coronary: 37 mm  Right -coronary: 36 mm  Left -coronary: 36 mm  Aortic Valve: Trileaflet with thickened leaflets and central non-coaptation. No calcifications.  Coronary Arteries:  Normal coronary origin.  Right dominance.  RCA is a large dominant artery that gives rise to PDA and PLA. There are only minimal  irregularities.  Left main is a large artery that gives rise to LAD and LCX arteries. Left main has only luminal irregularities.  LAD is a large vessel that gives rise to two large and one small diagonal arteries. Proximal LAD has minimal eccentric calcified plaque with stenosis 0-25%.  D1,2,3 have no significant plaque.  LCX is a non-dominant artery that gives rise to one small OM1 branch. There is no plaque.  Other findings:  Normal pulmonary vein drainage into the left atrium.  Normal left atrial appendage without a thrombus.  Dilated pulmonary artery measuring 35 mm suggestive of pulmonary hypertension.  IMPRESSION: 1. Coronary calcium score of 32. This was 5166 percentile for age and sex matched control.  2. Normal coronary origin with right dominance.  3. CAD-RADS 1. 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 Consider preventive therapy and risk factor modification.  4. Upper normal size of the ascending aorta with maximum diameter 40 mm. Minimal atherosclerotic plaque and calcifications. No dissection.  5. Aortic Valve: Trileaflet with thickened leaflets and central non-coaptation. No calcifications.   Electronically Signed   By: Tobias AlexanderKatarina  Nelson   On: 10/28/2020 13:15   CLINICAL DATA:  65 year old male with history of severe aortic stenosis under preprocedural evaluation prior to potential transcatheter aortic valve replacement (TAVR) procedure.  EXAM: CT ANGIOGRAPHY CHEST WITH CONTRAST  TECHNIQUE: Multidetector CT imaging of the chest was performed using the standard protocol during bolus administration of intravenous contrast. Multiplanar CT image reconstructions and MIPs were obtained to evaluate the vascular anatomy.  CONTRAST:  80mL OMNIPAQUE IOHEXOL 350 MG/ML SOLN  COMPARISON:  No priors.  FINDINGS: Cardiovascular: Heart size is borderline enlarged with  some concentric left ventricular hypertrophy. There is no significant pericardial fluid, thickening or pericardial calcification. There is aortic atherosclerosis, as well as atherosclerosis of the great vessels of the mediastinum and the coronary arteries, including calcified atherosclerotic plaque in the left anterior descending coronary artery. 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  Mediastinum/Nodes: No pathologically enlarged mediastinal or hilar lymph nodes. Esophagus is unremarkable in appearance. No axillary lymphadenopathy.  Lungs/Pleura: Patchy areas of ground-glass attenuation and mild interlobular septal thickening noted in the lungs bilaterally, nonspecific. No confluent consolidative airspace disease. No pleural effusions. A few scattered tiny 1-3 mm pulmonary nodules are noted in the lungs, nonspecific, but statistically likely benign. No other larger more suspicious appearing pulmonary nodules or masses are noted.  Upper Abdomen: Exophytic low-attenuation lesion in the upper pole of the right kidney measuring 2.7 cm in diameter, compatible with a simple cyst.  Musculoskeletal: There are no aggressive appearing lytic or blastic lesions noted in the visualized portions of the skeleton.  Review of the MIP images confirms the above findings.  IMPRESSION: 1. 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 Recommend annual imaging followup by CTA or MRA. This recommendation follows 2010 ACCF/AHA/AATS/ACR/ASA/SCA/SCAI/SIR/STS/SVM Guidelines for the Diagnosis and Management of Patients with Thoracic Aortic Disease. Circulation. 2010; 121: J478-G956: E266-e369. Aortic aneurysm NOS (ICD10-I71.9). 2. Borderline cardiomegaly with concentric left ventricular hypertrophy. 3. Patchy areas of ground-glass attenuation  and interlobular septal thickening in the lungs bilaterally, nonspecific. This could potentially reflect  a background of mild interstitial pulmonary edema, or could be indicative of early interstitial lung disease. 4. Multiple tiny 1-3 mm pulmonary nodules scattered throughout the lungs bilaterally, nonspecific, but statistically likely benign. No follow-up needed if patient is low-risk (and has no known or suspected primary neoplasm). Non-contrast chest CT can be considered in 12 months if patient is high-risk. This recommendation follows the consensus statement: Guidelines for Management of Incidental Pulmonary Nodules Detected on CT Images: From the Fleischner Society 2017; Radiology 2017; 284:228-243.  Aortic Atherosclerosis (ICD10-I70.0).   Electronically Signed   By: Trudie Reed M.D.   On: 10/24/2020 06:46  Impression:  This 65 year old gentleman has stage C, asymptomatic, severe aortic insufficiency with a trileaflet aortic valve with thickened leaflets and leaflet noncoaptation.  The aortic root and ascending aorta are minimally enlarged with a maximum diameter of the ascending aorta at 40 mm.  I do not think there is any indication for replacing his aorta especially with a trileaflet aortic valve.  I recommended that he undergo aortic valve replacement.  This should be performed to prevent progressive left ventricular deterioration and development of symptoms of congestive heart failure.  I reviewed the CTA study with the patient and his wife and answered their questions.  We discussed the alternatives of mechanical and bioprosthetic valves and decided on using a bioprosthetic bovine pericardial valve.  Plan:  Aortic valve replacement using a bioprosthetic valve.  Alleen Borne, MD

## 2020-11-23 NOTE — Brief Op Note (Signed)
11/23/2020  1:05 PM  PATIENT:  Eric [name]  65 y.o. male  PRE-OPERATIVE DIAGNOSIS:  SEVERE AORTIC INSUFFICIENCY  [name]-OPERATIVE DIAGNOSIS:  SEVERE AORTIC INSUFFICIENCY  PROCEDURE:  Procedure(s): AORTIC VALVE REPLACEMENT (AVR) USING EDWARDS INSPIRIS RESILIA 25 MM AORTIC VALVE (N/A) TRANSESOPHAGEAL ECHOCARDIOGRAM (TEE) (N/A)  SURGEON:  Surgeon(s) and Role:    * Bartle, Payton Doughty, MD - Primary  PHYSICIAN ASSISTANT: Ellee Wawrzyniak PA-C  ASSISTANTS: STAFF   ANESTHESIA:   general  EBL:  1481 mL   BLOOD ADMINISTERED:none  DRAINS: MEDIASTINAL CHEST TUBES   LOCAL MEDICATIONS USED:  NONE  SPECIMEN:  Source of Specimen:  AORTIC VALVE LEAFLETS  DISPOSITION OF SPECIMEN:  PATHOLOGY  COUNTS:  YES  TOURNIQUET:  * No tourniquets in log *  DICTATION: .Dragon Dictation  PLAN OF CARE: Admit to inpatient   PATIENT DISPOSITION:  ICU - intubated and hemodynamically stable.   Delay start of Pharmacological VTE agent (>24hrs) due to surgical blood loss or risk of bleeding: yes  COMPLICATIONS: NO KNOWN

## 2020-11-23 NOTE — Interval H&P Note (Signed)
History and Physical Interval Note:  11/23/2020 7:15 AM  Eric [name]  has presented today for surgery, with the diagnosis of SEVERE AI.  The various methods of treatment have been discussed with the patient and family. After consideration of risks, benefits and other options for treatment, the patient has consented to  Procedure(s): AORTIC VALVE REPLACEMENT (AVR) (N/A) TRANSESOPHAGEAL ECHOCARDIOGRAM (TEE) (N/A) as a surgical intervention.  The patient's history has been reviewed, patient examined, no change in status, stable for surgery.  I have reviewed the patient's chart and labs.  Questions were answered to the patient's satisfaction.     Alleen Borne

## 2020-11-23 NOTE — Anesthesia Procedure Notes (Signed)
Central Venous Catheter Insertion Performed by: Kipp Brood, MD, anesthesiologist Start/End1/01/2021 7:20 AM, 11/23/2020 7:30 AM Patient location: Pre-op. Preanesthetic checklist: patient identified, IV checked, site marked, risks and benefits discussed, surgical consent, monitors and equipment checked, pre-op evaluation, timeout performed and anesthesia consent Lidocaine 1% used for infiltration and patient sedated Hand hygiene performed  and maximum sterile barriers used  Catheter size: 9 Fr Sheath introducer Procedure performed using ultrasound guided technique. Ultrasound Notes:anatomy identified, needle tip was noted to be adjacent to the nerve/plexus identified, no ultrasound evidence of intravascular and/or intraneural injection and image(s) printed for medical record Attempts: 1 Following insertion, line sutured and dressing applied. Post procedure assessment: blood return through all ports, free fluid flow and no air  Patient tolerated the procedure well with no immediate complications.

## 2020-11-23 NOTE — Anesthesia Procedure Notes (Signed)
Arterial Line Insertion Start/End1/01/2021 7:52 AM, 11/23/2020 7:52 AM Performed by: Modena Morrow, CRNA, CRNA  Patient location: Pre-op. Preanesthetic checklist: patient identified, IV checked, site marked, risks and benefits discussed, surgical consent, monitors and equipment checked, pre-op evaluation, timeout performed and anesthesia consent Lidocaine 1% used for infiltration Left, radial was placed Catheter size: 20 Fr Hand hygiene performed  and maximum sterile barriers used   Attempts: 2 Procedure performed without using ultrasound guided technique. Following insertion, dressing applied and Biopatch. Post procedure assessment: normal and unchanged  Patient tolerated the procedure well with no immediate complications.

## 2020-11-23 NOTE — Progress Notes (Signed)
  Echocardiogram Echocardiogram Transesophageal has been performed.  Eric [name] 11/23/2020, 10:00 AM

## 2020-11-24 ENCOUNTER — Encounter (HOSPITAL_COMMUNITY): Payer: Self-pay | Admitting: Surgery

## 2020-11-24 ENCOUNTER — Inpatient Hospital Stay (HOSPITAL_COMMUNITY): Payer: Managed Care, Other (non HMO)

## 2020-11-24 LAB — CBC
HCT: 32.9 % — ABNORMAL LOW (ref 39.0–52.0)
HCT: 32.5 % — ABNORMAL LOW (ref 39.0–52.0)
Hemoglobin: 11.5 g/dL — ABNORMAL LOW (ref 13.0–17.0)
Hemoglobin: 11.3 g/dL — ABNORMAL LOW (ref 13.0–17.0)
MCH: 25.6 pg — ABNORMAL LOW (ref 26.0–34.0)
MCH: 25.6 pg — ABNORMAL LOW (ref 26.0–34.0)
MCHC: 35 g/dL (ref 30.0–36.0)
MCHC: 34.8 g/dL (ref 30.0–36.0)
MCV: 73.3 fL — ABNORMAL LOW (ref 80.0–100.0)
MCV: 73.5 fL — ABNORMAL LOW (ref 80.0–100.0)
Platelets: 163 10*3/uL (ref 150–400)
Platelets: 145 10*3/uL — ABNORMAL LOW (ref 150–400)
RBC: 4.49 MIL/uL (ref 4.22–5.81)
RBC: 4.42 MIL/uL (ref 4.22–5.81)
RDW: 13.6 % (ref 11.5–15.5)
RDW: 13.7 % (ref 11.5–15.5)
WBC: 17 10*3/uL — ABNORMAL HIGH (ref 4.0–10.5)
WBC: 19.6 10*3/uL — ABNORMAL HIGH (ref 4.0–10.5)
nRBC: 0 % (ref 0.0–0.2)
nRBC: 0 % (ref 0.0–0.2)

## 2020-11-24 LAB — BASIC METABOLIC PANEL
Anion gap: 9 (ref 5–15)
Anion gap: 7 (ref 5–15)
BUN: 20 mg/dL (ref 8–23)
BUN: 20 mg/dL (ref 8–23)
CO2: 22 mmol/L (ref 22–32)
CO2: 24 mmol/L (ref 22–32)
Calcium: 7.9 mg/dL — ABNORMAL LOW (ref 8.9–10.3)
Calcium: 8 mg/dL — ABNORMAL LOW (ref 8.9–10.3)
Chloride: 103 mmol/L (ref 98–111)
Chloride: 100 mmol/L (ref 98–111)
Creatinine, Ser: 1.56 mg/dL — ABNORMAL HIGH (ref 0.61–1.24)
Creatinine, Ser: 1.63 mg/dL — ABNORMAL HIGH (ref 0.61–1.24)
GFR, Estimated: 50 mL/min — ABNORMAL LOW (ref >60)
GFR, Estimated: 47 mL/min — ABNORMAL LOW (ref >60)
Glucose, Bld: 151 mg/dL — ABNORMAL HIGH (ref 70–99)
Glucose, Bld: 127 mg/dL — ABNORMAL HIGH (ref 70–99)
Potassium: 4.4 mmol/L (ref 3.5–5.1)
Potassium: 4.2 mmol/L (ref 3.5–5.1)
Sodium: 134 mmol/L — ABNORMAL LOW (ref 135–145)
Sodium: 131 mmol/L — ABNORMAL LOW (ref 135–145)

## 2020-11-24 LAB — SURGICAL PATHOLOGY

## 2020-11-24 LAB — GLUCOSE, CAPILLARY
Glucose-Capillary: 116 mg/dL — ABNORMAL HIGH (ref 70–99)
Glucose-Capillary: 123 mg/dL — ABNORMAL HIGH (ref 70–99)
Glucose-Capillary: 124 mg/dL — ABNORMAL HIGH (ref 70–99)
Glucose-Capillary: 131 mg/dL — ABNORMAL HIGH (ref 70–99)
Glucose-Capillary: 134 mg/dL — ABNORMAL HIGH (ref 70–99)
Glucose-Capillary: 137 mg/dL — ABNORMAL HIGH (ref 70–99)
Glucose-Capillary: 137 mg/dL — ABNORMAL HIGH (ref 70–99)
Glucose-Capillary: 144 mg/dL — ABNORMAL HIGH (ref 70–99)
Glucose-Capillary: 145 mg/dL — ABNORMAL HIGH (ref 70–99)
Glucose-Capillary: 145 mg/dL — ABNORMAL HIGH (ref 70–99)
Glucose-Capillary: 149 mg/dL — ABNORMAL HIGH (ref 70–99)
Glucose-Capillary: 161 mg/dL — ABNORMAL HIGH (ref 70–99)

## 2020-11-24 LAB — MAGNESIUM
Magnesium: 2.3 mg/dL (ref 1.7–2.4)
Magnesium: 2.3 mg/dL (ref 1.7–2.4)

## 2020-11-24 MED ORDER — PHENOL 1.4 % MT LIQD
1.0000 | OROMUCOSAL | Status: DC | PRN
  Administered 2020-11-24: 1 via OROMUCOSAL
  Filled 2020-11-24: qty 177

## 2020-11-24 MED ORDER — ALLOPURINOL 300 MG PO TABS
300.0000 mg | ORAL_TABLET | Freq: Every day | ORAL | Status: DC
  Administered 2020-11-24 – 2020-11-28 (×5): 300 mg via ORAL
  Filled 2020-11-24 (×5): qty 1

## 2020-11-24 MED ORDER — CARVEDILOL 3.125 MG PO TABS
3.1250 mg | ORAL_TABLET | Freq: Two times a day (BID) | ORAL | Status: DC
  Administered 2020-11-24 – 2020-11-25 (×4): 3.125 mg via ORAL
  Filled 2020-11-24 (×5): qty 1

## 2020-11-24 MED ORDER — INSULIN ASPART 100 UNIT/ML ~~LOC~~ SOLN
0.0000 [IU] | SUBCUTANEOUS | Status: DC
  Administered 2020-11-24 – 2020-11-25 (×4): 2 [IU] via SUBCUTANEOUS

## 2020-11-24 MED ORDER — INSULIN DETEMIR 100 UNIT/ML ~~LOC~~ SOLN
15.0000 [IU] | Freq: Every day | SUBCUTANEOUS | Status: DC
  Administered 2020-11-24 – 2020-11-25 (×2): 15 [IU] via SUBCUTANEOUS
  Filled 2020-11-24 (×2): qty 0.15

## 2020-11-24 MED ORDER — ENOXAPARIN SODIUM 40 MG/0.4ML ~~LOC~~ SOLN
40.0000 mg | Freq: Every day | SUBCUTANEOUS | Status: DC
  Administered 2020-11-24 – 2020-11-27 (×4): 40 mg via SUBCUTANEOUS
  Filled 2020-11-24 (×4): qty 0.4

## 2020-11-24 MED ORDER — TRAMADOL HCL 50 MG PO TABS
50.0000 mg | ORAL_TABLET | ORAL | Status: DC | PRN
  Administered 2020-11-24 – 2020-11-25 (×2): 50 mg via ORAL
  Filled 2020-11-24 (×2): qty 1

## 2020-11-24 MED FILL — Thrombin (Recombinant) For Soln 20000 Unit: CUTANEOUS | Qty: 1 | Status: AC

## 2020-11-24 MED FILL — Heparin Sodium (Porcine) Inj 1000 Unit/ML: INTRAMUSCULAR | Qty: 30 | Status: AC

## 2020-11-24 MED FILL — Potassium Chloride Inj 2 mEq/ML: INTRAVENOUS | Qty: 40 | Status: AC

## 2020-11-24 MED FILL — Magnesium Sulfate Inj 50%: INTRAMUSCULAR | Qty: 10 | Status: AC

## 2020-11-24 NOTE — Progress Notes (Signed)
      [address]       [place] [address]             [phone]      POD # 1 AVR  Up in chair  BP (!) 148/107   Pulse 82   Temp 98.4 F (36.9 C) (Oral)   Resp (!) 23   Ht 5\' 10"  (1.778 m)   Wt 99.4 kg   SpO2 91%   BMI 31.44 kg/m  2L [place] 90% sat  Intake/Output Summary (Last 24 hours) at 11/24/2020 1748 Last data filed at 11/24/2020 1700 Gross per 24 hour  Intake 1858.51 ml  Output 1810 ml  Net 48.51 ml   Creatinine 1.62(baseline 1.72), K= 4.2 Hct= 32  Doing well POD # 1  Tekesha Almgren C. 01/22/2021, MD [facility] [phone]

## 2020-11-24 NOTE — Plan of Care (Signed)

## 2020-11-24 NOTE — Progress Notes (Signed)
1 Day Post-Op Procedure(s) (LRB): AORTIC VALVE REPLACEMENT (AVR) USING EDWARDS INSPIRIS RESILIA 25 MM AORTIC VALVE (N/A) TRANSESOPHAGEAL ECHOCARDIOGRAM (TEE) (N/A) Subjective: No complaints. Stood without difficulty this am.  Objective: Vital signs in last 24 hours: Temp:  [96.26 F (35.7 C)-99.5 F (37.5 C)] 98.24 F (36.8 C) (01/04 0700) Pulse Rate:  [76-96] 83 (01/04 0700) Cardiac Rhythm: Normal sinus rhythm (01/04 0400) Resp:  [0-34] 30 (01/04 0700) BP: (87-149)/(67-101) 114/70 (01/04 0630) SpO2:  [93 %-100 %] 94 % (01/04 0700) Arterial Line BP: (89-158)/(62-96) 138/85 (01/04 0700) FiO2 (%):  [40 %-50 %] 40 % (01/03 1656) Weight:  [99.4 kg] 99.4 kg (01/04 0500)  Hemodynamic parameters for last 24 hours: PAP: (19-51)/(5-24) 33/18 CO:  [3.5 L/min-5.4 L/min] 4.7 L/min CI:  [1.6 L/min/m2-2.5 L/min/m2] 2.2 L/min/m2  Intake/Output from previous day: 01/03 0701 - 01/04 0700 In: 5300.2 [P.O.:600; I.V.:2937.2; Blood:765; IV Piggyback:998] Out: 5351 [Urine:3540; Blood:1481; Chest Tube:330] Intake/Output this shift: No intake/output data recorded.  General appearance: alert and cooperative Neurologic: intact Heart: regular rate and rhythm, S1, S2 normal, no murmur Lungs: clear to auscultation bilaterally Extremities: edema mild Wound: dressing dry  Lab Results: Recent Labs    11/23/20 1809 11/23/20 1811 11/24/20 0302  WBC 16.9*  --  17.0*  HGB 12.3* 11.9* 11.5*  HCT 35.4* 35.0* 32.9*  PLT 158  --  163   BMET:  Recent Labs    11/23/20 1809 11/23/20 1811 11/24/20 0302  NA 135 138 134*  K 4.9 4.9 4.4  CL 105  --  103  CO2 22  --  22  GLUCOSE 128*  --  151*  BUN 22  --  20  CREATININE 1.59*  --  1.56*  CALCIUM 8.0*  --  7.9*    PT/INR:  Recent Labs    11/23/20 1233  LABPROT 16.8*  INR 1.4*   ABG    Component Value Date/Time   PHART 7.328 (L) 11/23/2020 1811   HCO3 23.3 11/23/2020 1811   TCO2 25 11/23/2020 1811   ACIDBASEDEF 3.0 (H) 11/23/2020 1811    O2SAT 96.0 11/23/2020 1811   CBG (last 3)  Recent Labs    11/24/20 0522 11/24/20 0611 11/24/20 0659  GLUCAP 144* 145* 137*   CXR: ok  ECG: sinus, no acute changes.  Assessment/Plan: S/P Procedure(s) (LRB): AORTIC VALVE REPLACEMENT (AVR) USING EDWARDS INSPIRIS RESILIA 25 MM AORTIC VALVE (N/A) TRANSESOPHAGEAL ECHOCARDIOGRAM (TEE) (N/A)  POD 1 Hemodynamically stable in sinus rhythm. Resume Coreg. Preop EF 40-45%.  DC chest tubes, swan, arterial line.  Stage 3 CKD with baseline creat 1.7-1.8. Lower this am. Will DC renal dopamine. Follow creatinine. Wt is about at preop so will reevaluate tomorrow to decide on need for diuresis.  DC insulin drip and give Levemir this am to transition. Preop Hgb A1c normal.  IS, OOB, ambulate.   LOS: 1 day    Alleen Borne 11/24/2020

## 2020-11-25 ENCOUNTER — Encounter (HOSPITAL_COMMUNITY): Payer: Self-pay | Admitting: Surgery

## 2020-11-25 ENCOUNTER — Inpatient Hospital Stay (HOSPITAL_COMMUNITY): Payer: Managed Care, Other (non HMO)

## 2020-11-25 ENCOUNTER — Other Ambulatory Visit: Payer: Self-pay | Admitting: Cardiology

## 2020-11-25 DIAGNOSIS — I350 Nonrheumatic aortic (valve) stenosis: Secondary | ICD-10-CM

## 2020-11-25 LAB — BASIC METABOLIC PANEL
Anion gap: 7 (ref 5–15)
BUN: 18 mg/dL (ref 8–23)
CO2: 26 mmol/L (ref 22–32)
Calcium: 8 mg/dL — ABNORMAL LOW (ref 8.9–10.3)
Chloride: 98 mmol/L (ref 98–111)
Creatinine, Ser: 1.54 mg/dL — ABNORMAL HIGH (ref 0.61–1.24)
GFR, Estimated: 50 mL/min — ABNORMAL LOW (ref >60)
Glucose, Bld: 129 mg/dL — ABNORMAL HIGH (ref 70–99)
Potassium: 3.9 mmol/L (ref 3.5–5.1)
Sodium: 131 mmol/L — ABNORMAL LOW (ref 135–145)

## 2020-11-25 LAB — CBC
HCT: 28.5 % — ABNORMAL LOW (ref 39.0–52.0)
Hemoglobin: 10 g/dL — ABNORMAL LOW (ref 13.0–17.0)
MCH: 25.7 pg — ABNORMAL LOW (ref 26.0–34.0)
MCHC: 35.1 g/dL (ref 30.0–36.0)
MCV: 73.3 fL — ABNORMAL LOW (ref 80.0–100.0)
Platelets: 113 10*3/uL — ABNORMAL LOW (ref 150–400)
RBC: 3.89 MIL/uL — ABNORMAL LOW (ref 4.22–5.81)
RDW: 13.8 % (ref 11.5–15.5)
WBC: 17.7 10*3/uL — ABNORMAL HIGH (ref 4.0–10.5)
nRBC: 0 % (ref 0.0–0.2)

## 2020-11-25 LAB — GLUCOSE, CAPILLARY
Glucose-Capillary: 122 mg/dL — ABNORMAL HIGH (ref 70–99)
Glucose-Capillary: 100 mg/dL — ABNORMAL HIGH (ref 70–99)

## 2020-11-25 MED ORDER — HYDROCHLOROTHIAZIDE 12.5 MG PO CAPS
12.5000 mg | ORAL_CAPSULE | Freq: Every day | ORAL | Status: DC
Start: 2020-11-25 — End: 2020-11-27
  Administered 2020-11-25 – 2020-11-26 (×2): 12.5 mg via ORAL
  Filled 2020-11-25 (×2): qty 1

## 2020-11-25 MED ORDER — SENNOSIDES-DOCUSATE SODIUM 8.6-50 MG PO TABS
1.0000 | ORAL_TABLET | Freq: Two times a day (BID) | ORAL | Status: DC | PRN
  Administered 2020-11-25: 1 via ORAL
  Filled 2020-11-25: qty 1

## 2020-11-25 MED ORDER — SODIUM CHLORIDE 0.9 % IV SOLN: 250.0000 mL | INTRAVENOUS | Status: DC | PRN

## 2020-11-25 MED ORDER — SODIUM CHLORIDE 0.9% FLUSH
3.0000 mL | Freq: Two times a day (BID) | INTRAVENOUS | Status: DC
  Administered 2020-11-25 – 2020-11-27 (×6): 3 mL via INTRAVENOUS

## 2020-11-25 MED ORDER — ASPIRIN EC 325 MG PO TBEC
325.0000 mg | DELAYED_RELEASE_TABLET | Freq: Every day | ORAL | Status: DC
  Administered 2020-11-26 – 2020-11-28 (×3): 325 mg via ORAL
  Filled 2020-11-25 (×3): qty 1

## 2020-11-25 MED ORDER — ACETAMINOPHEN 325 MG PO TABS: 650.0000 mg | ORAL_TABLET | Freq: Four times a day (QID) | ORAL | Status: DC | PRN

## 2020-11-25 MED ORDER — ~~LOC~~ CARDIAC SURGERY, PATIENT & FAMILY EDUCATION: Freq: Once | Status: AC

## 2020-11-25 MED ORDER — TRAMADOL HCL 50 MG PO TABS
50.0000 mg | ORAL_TABLET | Freq: Four times a day (QID) | ORAL | Status: DC | PRN
  Administered 2020-11-26 – 2020-11-27 (×3): 50 mg via ORAL
  Filled 2020-11-25 (×3): qty 1

## 2020-11-25 MED ORDER — OXYCODONE HCL 5 MG PO TABS: 5.0000 mg | ORAL_TABLET | ORAL | Status: DC | PRN

## 2020-11-25 MED ORDER — ONDANSETRON HCL 4 MG PO TABS: 4.0000 mg | ORAL_TABLET | Freq: Four times a day (QID) | ORAL | Status: DC | PRN

## 2020-11-25 MED ORDER — INSULIN ASPART 100 UNIT/ML ~~LOC~~ SOLN: 0.0000 [IU] | Freq: Three times a day (TID) | SUBCUTANEOUS | Status: DC

## 2020-11-25 MED ORDER — SODIUM CHLORIDE 0.9% FLUSH: 3.0000 mL | INTRAVENOUS | Status: DC | PRN

## 2020-11-25 MED ORDER — ONDANSETRON HCL 4 MG/2ML IJ SOLN: 4.0000 mg | Freq: Four times a day (QID) | INTRAMUSCULAR | Status: DC | PRN

## 2020-11-25 MED ORDER — PANTOPRAZOLE SODIUM 40 MG PO TBEC
40.0000 mg | DELAYED_RELEASE_TABLET | Freq: Every day | ORAL | Status: DC
  Administered 2020-11-26 – 2020-11-28 (×3): 40 mg via ORAL
  Filled 2020-11-25 (×3): qty 1

## 2020-11-25 MED FILL — Mannitol IV Soln 20%: INTRAVENOUS | Qty: 500 | Status: AC

## 2020-11-25 MED FILL — Sodium Bicarbonate IV Soln 8.4%: INTRAVENOUS | Qty: 50 | Status: AC

## 2020-11-25 MED FILL — Lidocaine HCl Local Soln Prefilled Syringe 100 MG/5ML (2%): INTRAMUSCULAR | Qty: 5 | Status: AC

## 2020-11-25 MED FILL — Sodium Chloride IV Soln 0.9%: INTRAVENOUS | Qty: 2000 | Status: AC

## 2020-11-25 MED FILL — Electrolyte-R (PH 7.4) Solution: INTRAVENOUS | Qty: 4000 | Status: AC

## 2020-11-25 MED FILL — Heparin Sodium (Porcine) Inj 1000 Unit/ML: INTRAMUSCULAR | Qty: 10 | Status: AC

## 2020-11-25 NOTE — Progress Notes (Signed)
Pt received from 2H to 4e14. Oriented x4. CHG bath complete. CCMD called, VSS. Call bell in reach. Will continue to monitor.  Hazle Nordmann, RN

## 2020-11-25 NOTE — Hospital Course (Signed)
    HPI:   The patient is a 65 year old gentleman with history of hypertension, a long history of stage III chronic kidney disease with a creatinine of 1.67 in 2016 and 1.86 a year ago, left ventricular hypertrophy, and severe aortic insufficiency that has been followed by Dr. Katrinka Blazing with periodic echocardiograms.  His most recent echocardiogram on 09/04/2020 showed a left ventricular ejection fraction of 55 to 60% with moderate LVH.  There is grade 2 diastolic dysfunction.  Left ventricular diastolic internal diameter was 5.8 cm and systolic was 4.1 cm.  This has been stable over the past year.  The aortic valve appears trileaflet with severe regurgitation and a pressure half-time of 199 ms.  The ascending aorta was measured at 4.2 cm on his current echo and was measured at 3.8 cm on his prior echo in April 2021.  He is married and lives with his wife.  He denies any symptoms of exertional shortness of breath or fatigue.  He has not been exercising much.  He has had no chest pain or pressure.  He denies dizziness and syncope.  He said no orthopnea or PND.  Denies peripheral edema.   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   Patient was admitted electively and on 11/23/2020 he was taken to the operating room where he underwent aortic valve replacement.  He tolerated the procedure well and was taken to the surgical intensive care unit in stable condition.  Postoperative hospital course:  The patient has done well.  He has remained hemodynamically stable.  Sinus rhythm.  Coreg has been resumed.  On postoperative day #1 his chest tubes Swan and arterial lines were all discontinued.  We are following his renal function closely.  He does have stage III CKD and so his creatinine is below baseline.  He does have a expected acute blood loss anemia which is stabilized. On postoperative day #2 he is transferred to 4 E. for continued cardiac rehabilitation and monitoring.

## 2020-11-25 NOTE — Discharge Summary (Signed)
Physician Discharge Summary  Patient ID: AMANUEL SINKFIELD MRN: [id number] DOB/AGE: 06-15-1956 65 y.o.  Admit date: 11/23/2020 Discharge date: 11/28/2020  Admission Diagnoses:  Discharge Diagnoses:  Active Problems:   S/P AVR (aortic valve replacement)  Patient Active Problem List   Diagnosis Date Noted  . S/P AVR (aortic valve replacement) 11/23/2020  . Acute respiratory failure with hypoxia (HCC) 07/09/2019  . CKD (chronic kidney disease), stage III (HCC) 07/09/2019  . AKI (acute kidney injury) (HCC) 07/09/2019  . COVID-19 virus infection 07/08/2019  . Essential hypertension 08/31/2017  . 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 GERD (gastroesophageal reflux disease)   . Allergy   . Sinusitis, chronic   . Aortic valve regurgitation 07/10/2014   HPI:   The patient is a 65 year old gentleman with history of hypertension, a long history of stage III chronic kidney disease with a creatinine of 1.67 in 2016 and 1.86 a year ago, left ventricular hypertrophy, and severe aortic insufficiency that has been followed by Dr. Katrinka Blazing with periodic echocardiograms.  His most recent echocardiogram on 09/04/2020 showed a left ventricular ejection fraction of 55 to 60% with moderate LVH.  There is grade 2 diastolic dysfunction.  Left ventricular diastolic internal diameter was 5.8 cm and systolic was 4.1 cm.  This has been stable over the past year.  The aortic valve appears trileaflet with severe regurgitation and a pressure half-time of 199 ms.  The ascending aorta was measured at 4.2 cm on his current echo and was measured at 3.8 cm on his prior echo in April 2021.  He is married and lives with his wife.  He denies any symptoms of exertional shortness of breath or fatigue.  He has not been exercising much.  He has had no chest pain or pressure.  He denies dizziness and syncope.  He said no orthopnea or PND.  Denies peripheral edema.   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   Patient was admitted electively and on 11/23/2020 he was taken to the operating room where he underwent aortic valve replacement.  He tolerated the procedure well and was taken to the surgical intensive care unit in stable condition.  Postoperative hospital course:  The patient has done well.  He has remained hemodynamically stable.  Sinus rhythm.  Coreg has been resumed.  On postoperative day #1 his chest tubes Swan and arterial lines were all discontinued.  We are following his renal function closely.  He does have stage III CKD and so his creatinine is below baseline.  He does have a expected acute blood loss anemia which is stabilized. On postoperative day #2 he is transferred to 4 E. for continued cardiac rehabilitation and monitoring.  He has continued to progress well.  He has had some sinus tachycardia and we have uptitrated his beta-blocker dose.  Blood pressure and heart rate have improved over time.  Incisions are noted to be healing well without evidence of infection.  He is tolerating routine cardiac rehabilitation phase 1 modalities using standard protocols.  Oxygen has been weaned and he maintains good saturations on room air.  He is tolerating diet and having bowel movements.  Overall at the time of discharge the patient is felt to be quite stable.  Discharged Condition: good  Consults: None  Significant Diagnostic Studies: routine post-op labs and serial CXR's  Treatments: surgery:  11/23/2020 [facility] [id number]  Surgeon:  Alleen Borne, MD  First  Assistant: Gershon Crane,  PA-C   Preoperative Diagnosis:  Severe aortic insufficiency   Postoperative Diagnosis:  Same   Procedure:  1. Median Sternotomy 2. Extracorporeal circulation 3.   Aortic valve replacement using a 25 mm Edwards INSPIRIS RESILIA pericardial valve.  Anesthesia:  General Endotracheal  Discharge Exam: Blood pressure (!) 147/107, pulse 100, temperature 98 F  (36.7 C), temperature source Oral, resp. rate 18, height 5\' 10"  (1.778 m), weight 95.7 kg, SpO2 98 %.   General appearance: alert, cooperative and no distress Heart: regular rate and rhythm Lungs: clear to auscultation bilaterally Abdomen: benign Extremities: no edema Wound: incis healing well   Disposition: Discharge disposition: 01-Home or Self Care       Discharge Instructions    Discharge patient   Complete by: As directed    Discharge disposition: 01-Home or Self Care   Discharge patient date: 11/28/2020     Allergies as of 11/28/2020      Reactions   Amlodipine Cough      Medication List    STOP taking these medications   hydrochlorothiazide 25 MG tablet Commonly known as: HYDRODIURIL     TAKE these medications   acetaminophen 500 MG tablet Commonly known as: TYLENOL Take 1,000 mg by mouth every 6 (six) hours as needed for moderate pain.   allopurinol 300 MG tablet Commonly known as: ZYLOPRIM Take 300 mg by mouth daily.   aspirin 325 MG EC tablet Take 1 tablet (325 mg total) by mouth daily.   carvedilol 12.5 MG tablet Commonly known as: COREG Take 1 tablet (12.5 mg total) by mouth 2 (two) times daily with a meal. What changed:   medication strength  See the new instructions.   cetirizine 10 MG tablet Commonly known as: ZYRTEC Take 10 mg by mouth as needed for allergies.   MULTIVITAMIN/IRON PO Take 1 tablet by mouth daily.   rosuvastatin 10 MG tablet Commonly known as: CRESTOR Take 1 tablet (10 mg total) by mouth daily.   traMADol 50 MG tablet Commonly known as: ULTRAM Take 1 tablet (50 mg total) by mouth every 6 (six) hours as needed for moderate pain.       Follow-up Information    01/26/2021, MD Follow up.   Specialty: Cardiothoracic Surgery Why: Please see discharge paperwork for follow-up appointment with surgeon as well as suture removal.  Obtain a chest x-ray 1/2-hour prior to appointment with surgeon at [facility] imaging,  located in the same office complex. Contact information: [address] [place] [id number]        [phone], MD Follow up.   Specialty: Cardiology Why: See discharge paperwork for follow-up appointment with cardiology Contact information: [address] [place] [phone]               Signed: [phone] PA-C 11/28/2020, 7:54 AM

## 2020-11-25 NOTE — Discharge Instructions (Signed)
[facility] office number [phone]    Surgical Aortic Valve Replacement, Care After This sheet gives you information about how to care for yourself after your procedure. Your health care provider may also give you more specific instructions. If you have problems or questions, contact your health care provider. What can I expect after the procedure? After the procedure, it is common to have pain around your incision area. Follow these instructions at home: Medicines  Take over-the-counter and prescription medicines only as told by your health care provider.  If you were prescribed an antibiotic medicine, take it as told by your health care provider. Do not stop taking the antibiotic even if you start to feel better.  If you have a mechanical prosthesis, you may be given a blood thinner called warfarin. Follow instructions carefully on how to take this medicine.  Ask your health care provider if the medicine prescribed to you: ? Requires you to avoid driving or using heavy machinery. ? Can cause constipation. You may need to take actions to prevent or treat constipation, such as:  Take over-the-counter or prescription medicines.  Eat foods that are high in fiber, such as beans, whole grains, and fresh fruits and vegetables.  Limit foods that are high in fat and processed sugars, such as fried or sweet foods. Eating and drinking      Limit how much caffeine you drink. Caffeine can affect your heart's rate and rhythm.  Do not drink alcohol if: ? Your health care provider tells you not to drink. ? You are pregnant, may be pregnant, or are planning to become pregnant.  Drink enough fluid to keep your urine pale yellow.  Eat a heart-healthy diet that includes fruits, vegetables, whole grains, low-fat dairy products, and lean proteins like poultry and eggs. Incision care   Follow instructions from your health care provider about how to take care of your incision. Make sure  you: ? Wash your hands with soap and water before and after you change your bandage (dressing). If soap and water are not available, use hand sanitizer. ? Change your dressing as told by your health care provider. ? Leave stitches (sutures), skin glue, or adhesive strips in place. These skin closures may need to stay in place for 2 weeks or longer. If adhesive strip edges start to loosen and curl up, you may trim the loose edges. Do not remove adhesive strips completely unless your health care provider tells you to do that.  Check your incision area every day for signs of infection. Check for: ? Redness, swelling, or increasing pain. ? Fluid or blood. ? Warmth. ? Pus or a bad smell. Activity  Return to your normal activities as told by your health care provider. Most patients will need to limit any lifting or strenuous activity for 4-6 weeks.  Avoid sitting for a long time without moving. Get up to take short walks every 1-2 hours.  Do exercises as told by your health care provider.  Do not lift anything that is heavier than 10 lb (4.5 kg), or the limit that you are told, until your health care provider says that it is safe.  Avoid pushing or pulling things with your arms until your health care provider approves. This includes pulling on handrails to help you climb stairs. Lifestyle  Do not use any products that contain nicotine or tobacco, such as cigarettes, e-cigarettes, and chewing tobacco. These can delay incision healing after surgery. If you need help quitting, ask your health  care provider.  Resume sexual activity as told by your health care provider. If you have erectile dysfunction, do not use medicines to treat this condition unless your health care provider approves.  Work with your health care provider to: ? Keep your blood pressure and cholesterol under control. ? Manage any other heart conditions that you have. ? Maintain a healthy weight. Driving and travel  Do not  drive until your health care provider approves. Ask your health care provider when it is safe for you to drive.  Avoid airplane travel for as long as told by your health care provider.  When you travel, bring a list of your medicines and a record of your medical history with you. Carry your medicines with you. General instructions  Do not take baths, swim, or use a hot tub until your health care provider approves. You may shower.  Do not strain to have a bowel movement.  Avoid crossing your legs while sitting down.  Check your temperature every day for a fever. A fever may be a sign of infection.  If you are a woman and you plan to become pregnant, talk with your health care provider before you become pregnant.  Wear compression stockings as told by your health care provider. These stockings help to prevent blood clots and reduce swelling in your legs.  Tell all health care providers who care for you that you have an artificial (prosthetic) aortic valve. Also, tell them if you have or have had heart disease or endocarditis.  You will be given a card at discharge. The card indicates the type of prosthetic valve that you have. Keep this card in your wallet or purse for quick reference in case of an emergency.  Keep all follow-up visits as told by your health care provider. This is important. Contact a health care provider if:  You develop a skin rash.  You experience sudden, unexplained changes in your weight.  You have redness, swelling, or increasing pain around your incision.  You have fluid or blood coming from your incision.  Your incision feels warm to the touch.  You have pus or a bad smell coming from your incision.  You have a fever. Get help right away if you:  Develop chest pain that is different from the pain coming from your incision.  Develop shortness of breath or difficulty breathing.  Start to feel light-headed. These symptoms may represent a serious  problem that is an emergency. Do not wait to see if the symptoms will go away. Get medical help right away. Call your local emergency services (911 in the [place] Do not drive yourself to the hospital. Summary  After this procedure, it is common to have pain in the incision area.  Eat a heart-healthy diet. Follow instructions about alcohol use.  Get up to walk often. Avoid pushing and pulling with your arms. Ask what activities are safe for you.  Care for your incision as told by your health care provider. Check it daily for signs of infection.  Get help right away if you have chest pain that is different from your incisions, develop shortness of breath or difficulty breathing, or start to feel light-headed. This information is not intended to replace advice given to you by your health care provider. Make sure you discuss any questions you have with your health care provider. Document Revised: 08/02/2018 Document Reviewed: 08/02/2018 Elsevier Patient Education  2020 [employer].

## 2020-11-25 NOTE — Progress Notes (Signed)
2 Days Post-Op Procedure(s) (LRB): AORTIC VALVE REPLACEMENT (AVR) USING EDWARDS INSPIRIS RESILIA 25 MM AORTIC VALVE (N/A) TRANSESOPHAGEAL ECHOCARDIOGRAM (TEE) (N/A) Subjective: No complaints. Slept well. Ambulated. Pain under good control  Objective: Vital signs in last 24 hours: Temp:  [98 F (36.7 C)-99.4 F (37.4 C)] 98.8 F (37.1 C) (01/05 0517) Pulse Rate:  [78-94] 94 (01/05 0700) Cardiac Rhythm: Normal sinus rhythm (01/04 2000) Resp:  [12-24] 16 (01/05 0700) BP: (125-154)/(77-115) 138/105 (01/05 0700) SpO2:  [89 %-98 %] 91 % (01/05 0700) Weight:  [100.2 kg] 100.2 kg (01/05 0544)  Hemodynamic parameters for last 24 hours:    Intake/Output from previous day: 01/04 0701 - 01/05 0700 In: 315.1 [I.V.:100.1; IV Piggyback:215.1] Out: 1075 [Urine:1075] Intake/Output this shift: No intake/output data recorded.  General appearance: alert and cooperative Neurologic: intact Heart: regular rate and rhythm, S1, S2 normal, no murmur Lungs: diminished breath sounds bibasilar Extremities: edema minimal Wound: dressing dry  Lab Results: Recent Labs    11/24/20 1617 11/25/20 0423  WBC 19.6* 17.7*  HGB 11.3* 10.0*  HCT 32.5* 28.5*  PLT 145* 113*   BMET:  Recent Labs    11/24/20 1617 11/25/20 0423  NA 131* 131*  K 4.2 3.9  CL 100 98  CO2 24 26  GLUCOSE 127* 129*  BUN 20 18  CREATININE 1.63* 1.54*  CALCIUM 8.0* 8.0*    PT/INR:  Recent Labs    11/23/20 1233  LABPROT 16.8*  INR 1.4*   ABG    Component Value Date/Time   PHART 7.328 (L) 11/23/2020 1811   HCO3 23.3 11/23/2020 1811   TCO2 25 11/23/2020 1811   ACIDBASEDEF 3.0 (H) 11/23/2020 1811   O2SAT 96.0 11/23/2020 1811   CBG (last 3)  Recent Labs    11/24/20 2350 11/25/20 0417 11/25/20 0644  GLUCAP 116* 122* 100*   CXR: left base atelectasis  Assessment/Plan: S/P Procedure(s) (LRB): AORTIC VALVE REPLACEMENT (AVR) USING EDWARDS INSPIRIS RESILIA 25 MM AORTIC VALVE (N/A) TRANSESOPHAGEAL  ECHOCARDIOGRAM (TEE) (N/A)  POD 2 Hemodynamically stable in sinus rhythm. Continue Coreg.  Mild volume excess: wt is only 1 lb above preop wt measured at 220 but he says he usually weighs 212 at home. He has mild edema so will resume the HCTZ he was on at home.  Stage 3 CKD: creat is below baseline preop of 1.7.   DC sleeve, foley.   Transfer to 4E, IS, ambulation.   LOS: 2 days    Eric [name] 11/25/2020

## 2020-11-25 NOTE — Progress Notes (Signed)
CARDIAC REHAB PHASE I   PRE:  Rate/Rhythm: 94 SR    BP: lying 134/95    SaO2: 96 RA  MODE:  Ambulation: 470 ft   POST:  Rate/Rhythm: 115 ST    BP: sitting 155/115, recheck 159/105     SaO2: 97 RA   Pt with min assist to get out of bed, ambulated independently in hall. No c/o, steady gait. HR up to 115 ST, BP elevated after walk. To recliner to eat. Encouraged IS. This was his third walks today, progressing well. [id number]  Harriet Masson CES, ACSM 11/25/2020 12:54 PM

## 2020-11-25 NOTE — Progress Notes (Signed)
Mobility Specialist - Progress Note   11/25/20 1515  Mobility  Activity Ambulated in hall  Level of Assistance Standby assist, set-up cues, supervision of patient - no hands on  Assistive Device None  Distance Ambulated (ft) 500 ft  Mobility Response Tolerated well  Mobility performed by Mobility specialist  $Mobility charge 1 Mobility    Pre-mobility: 92 HR During mobility: 112 HR Post-mobility: 95 HR  Asx throughout ambulation. Pt back in recliner after walk.   Mamie Levers Mobility Specialist Mobility Specialist Phone: [phone]

## 2020-11-25 NOTE — Addendum Note (Signed)
Addendum  created 11/25/20 0654 by Adair Laundry, CRNA   Order list changed, Pharmacy for encounter modified

## 2020-11-26 MED ORDER — DM-GUAIFENESIN ER 30-600 MG PO TB12
1.0000 | ORAL_TABLET | Freq: Two times a day (BID) | ORAL | Status: DC
  Administered 2020-11-26 – 2020-11-28 (×5): 1 via ORAL
  Filled 2020-11-26 (×5): qty 1

## 2020-11-26 MED ORDER — CARVEDILOL 12.5 MG PO TABS
12.5000 mg | ORAL_TABLET | Freq: Two times a day (BID) | ORAL | Status: DC
  Administered 2020-11-26 – 2020-11-28 (×4): 12.5 mg via ORAL
  Filled 2020-11-26 (×4): qty 1

## 2020-11-26 MED ORDER — CARVEDILOL 6.25 MG PO TABS
6.2500 mg | ORAL_TABLET | Freq: Two times a day (BID) | ORAL | Status: DC
Start: 2020-11-26 — End: 2020-11-26
  Administered 2020-11-26: 6.25 mg via ORAL
  Filled 2020-11-26: qty 1

## 2020-11-26 NOTE — Progress Notes (Addendum)
Plan of care reviewed. Pt's been progressing. Denied pain,able to rest well tonight.   CCMD notified that  EKG was accelerated junctional rhythm, HR 120s. when Pt ambulated to bathroom  Pt's asymptomatic. We rechecked with 12 lead EKG result posted in chart showed sinus tachy with inferior infract with age undetermined. Otherwise he's hemodynamically stable. Pacer wises intact, rolled and taped. Sternal wound dressing dry and clean. No immediate distress noted. We will continue to monitor.    Filiberto Pinks, RN

## 2020-11-26 NOTE — Progress Notes (Addendum)
[address]       [place] [address]             [phone]      3 Days Post-Op Procedure(s) (LRB): AORTIC VALVE REPLACEMENT (AVR) USING EDWARDS INSPIRIS RESILIA 25 MM AORTIC VALVE (N/A) TRANSESOPHAGEAL ECHOCARDIOGRAM (TEE) (N/A) Subjective: Feels pretty well overall, some sputum/pulm congestion  Objective: Vital signs in last 24 hours: Temp:  [98 F (36.7 C)-99.6 F (37.6 C)] 99.1 F (37.3 C) (01/06 0606) Pulse Rate:  [78-116] 105 (01/06 0606) Cardiac Rhythm: Junctional rhythm (01/06 0310) Resp:  [13-24] 19 (01/06 0606) BP: (108-147)/(79-104) 133/104 (01/06 0606) SpO2:  [91 %-99 %] 93 % (01/06 0606) Weight:  [97.1 kg] 97.1 kg (01/06 0332)  Hemodynamic parameters for last 24 hours:    Intake/Output from previous day: 01/05 0701 - 01/06 0700 In: 840 [P.O.:740; IV Piggyback:100] Out: 1250 [Urine:1250] Intake/Output this shift: No intake/output data recorded.  General appearance: alert, cooperative and no distress Heart: regular rate and rhythm and tachy Lungs: some right lower lobe ronchi Abdomen: benign Extremities: no edema Wound: incis dressed  Lab Results: Recent Labs    11/24/20 1617 11/25/20 0423  WBC 19.6* 17.7*  HGB 11.3* 10.0*  HCT 32.5* 28.5*  PLT 145* 113*   BMET:  Recent Labs    11/24/20 1617 11/25/20 0423  NA 131* 131*  K 4.2 3.9  CL 100 98  CO2 24 26  GLUCOSE 127* 129*  BUN 20 18  CREATININE 1.63* 1.54*  CALCIUM 8.0* 8.0*    PT/INR:  Recent Labs    11/23/20 1233  LABPROT 16.8*  INR 1.4*   ABG    Component Value Date/Time   PHART 7.328 (L) 11/23/2020 1811   HCO3 23.3 11/23/2020 1811   TCO2 25 11/23/2020 1811   ACIDBASEDEF 3.0 (H) 11/23/2020 1811   O2SAT 96.0 11/23/2020 1811   CBG (last 3)  Recent Labs    11/24/20 2350 11/25/20 0417 11/25/20 0644  GLUCAP 116* 122* 100*    Meds Scheduled Meds: . allopurinol  300 mg Oral Daily  . aspirin EC  325 mg Oral Daily  . carvedilol  3.125 mg Oral  BID WC  . enoxaparin (LOVENOX) injection  40 mg Subcutaneous QHS  . hydrochlorothiazide  12.5 mg Oral Daily  . mouth rinse  15 mL Mouth Rinse BID  . pantoprazole  40 mg Oral QAC breakfast  . rosuvastatin  10 mg Oral Daily  . sodium chloride flush  3 mL Intravenous Q12H   Continuous Infusions: . sodium chloride     PRN Meds:.sodium chloride, acetaminophen, ondansetron **OR** ondansetron (ZOFRAN) IV, oxyCODONE, phenol, senna-docusate, sodium chloride flush, traMADol  Xrays DG Chest Port 1 View  Result Date: 11/25/2020 CLINICAL DATA:  Status post aortic valve replacement EXAM: PORTABLE CHEST 1 VIEW COMPARISON:  11/24/2020 FINDINGS: Lung volumes are small with elevation of the right hemidiaphragm again noted. Pulmonary insufflation, however, appears stable. Discoid atelectasis within the right upper lung zone. No pneumothorax. Small left pleural effusion may be present, unchanged. Cordis introducer is unchanged overlying the expected innominate vein. Swan-Ganz catheter, however, has been removed. Both mediastinal drains have been removed. Surgical changes of aortic valve replacement are again identified. Moderate cardiomegaly is stable. IMPRESSION: Stable pulmonary hypoinflation and elevation of the right hemidiaphragm. Probable small left pleural effusion. Stable moderate cardiomegaly. Electronically Signed   By: Helyn Numbers MD   On: 11/25/2020 06:32    Assessment/Plan: S/P Procedure(s) (LRB): AORTIC VALVE REPLACEMENT (AVR)  USING EDWARDS INSPIRIS RESILIA 25 MM AORTIC VALVE (N/A) TRANSESOPHAGEAL ECHOCARDIOGRAM (TEE) (N/A)   1 tmax 99.6, VSS SBP in 140's at times, diastolic in 90's at times as well. SR, Sinus tachy- will increase coreg 2 sats good on RA, add mucinex and flutter valve 3 BS adeq controlled 4 no new labs- will f/u in am 5 leave wires for now as adjusting beta blocker 6 does not appear volume overloaded 7 routine cardiac rehab  LOS: 3 days    Rowe Clack PA-C Pager [phone]  [phone] 11/26/2020   Chart reviewed, patient examined, agree with above. Hypertensive with resting tachycardia. Will titrate Coreg upwards.

## 2020-11-26 NOTE — Progress Notes (Signed)
CARDIAC REHAB PHASE I   PRE:  Rate/Rhythm: 101 ST with PVC    BP: sitting 136/101    SaO2: 95 RA  MODE:  Ambulation: 470 ft   POST:  Rate/Rhythm: 120 ST with PVC occ    BP: sitting 160/105     SaO2: 95 RA   Pt more tired today but able to stand and walk independently. More SOB with elevated HR, BP still elevated. Return to recliner. Encouraged IS and walking. [phone]  Eric [name] CES, ACSM 11/26/2020 9:08 AM

## 2020-11-26 NOTE — Progress Notes (Signed)
Mobility Specialist - Progress Note   11/26/20 1125  Mobility  Activity Ambulated in hall  Level of Assistance Independent  Assistive Device None  Distance Ambulated (ft) 500 ft  Mobility Response Tolerated well  Mobility performed by Mobility specialist  $Mobility charge 1 Mobility    Pre-mobility: 104 HR, 95% SpO2 During mobility: 114 HR Post-mobility: 105 HR 98% SpO2  Asx throughout ambulation. Pt in recliner after walk.   Mamie Levers Mobility Specialist Mobility Specialist Phone: [phone]

## 2020-11-27 LAB — CBC
HCT: 27.4 % — ABNORMAL LOW (ref 39.0–52.0)
Hemoglobin: 9.6 g/dL — ABNORMAL LOW (ref 13.0–17.0)
MCH: 25.4 pg — ABNORMAL LOW (ref 26.0–34.0)
MCHC: 35 g/dL (ref 30.0–36.0)
MCV: 72.5 fL — ABNORMAL LOW (ref 80.0–100.0)
Platelets: 193 10*3/uL (ref 150–400)
RBC: 3.78 MIL/uL — ABNORMAL LOW (ref 4.22–5.81)
RDW: 13.6 % (ref 11.5–15.5)
WBC: 12.8 10*3/uL — ABNORMAL HIGH (ref 4.0–10.5)
nRBC: 0 % (ref 0.0–0.2)

## 2020-11-27 LAB — BASIC METABOLIC PANEL
Anion gap: 11 (ref 5–15)
BUN: 16 mg/dL (ref 8–23)
CO2: 25 mmol/L (ref 22–32)
Calcium: 8.4 mg/dL — ABNORMAL LOW (ref 8.9–10.3)
Chloride: 98 mmol/L (ref 98–111)
Creatinine, Ser: 1.5 mg/dL — ABNORMAL HIGH (ref 0.61–1.24)
GFR, Estimated: 52 mL/min — ABNORMAL LOW (ref >60)
Glucose, Bld: 107 mg/dL — ABNORMAL HIGH (ref 70–99)
Potassium: 3.2 mmol/L — ABNORMAL LOW (ref 3.5–5.1)
Sodium: 134 mmol/L — ABNORMAL LOW (ref 135–145)

## 2020-11-27 MED ORDER — POTASSIUM CHLORIDE CRYS ER 20 MEQ PO TBCR
30.0000 meq | EXTENDED_RELEASE_TABLET | Freq: Two times a day (BID) | ORAL | Status: AC
  Administered 2020-11-27 (×2): 30 meq via ORAL
  Filled 2020-11-27 (×2): qty 1

## 2020-11-27 NOTE — Progress Notes (Signed)
Mobility Specialist: Progress Note   11/27/20 1407  Mobility  Activity Ambulated in room  Level of Assistance Independent  Assistive Device None  Distance Ambulated (ft) 1000 ft  Mobility Response Tolerated well  Mobility performed by Mobility specialist  Bed Position Chair  $Mobility charge 1 Mobility   Pre-Mobility: 102 HR During Mobility: 112 HR Post-Mobility: 102 HR, 125/97 BP, 94% SpO2  Pt was asx during ambulation. Pt to chair after walk to eat lunch with call bell in his lap.   [facility] Ali Mclaurin Mobility Specialist

## 2020-11-27 NOTE — Progress Notes (Signed)
Pt's hemodynamically stable. No distress noted. NSR on monitor. Sternal wound cleaned with Betadine, dry and clean, no drainage. Pacing wires intact, rolled and protected with gauze and tape. No acute distress. He's able to rest well tonight. Pain controlled well.  Pt already had BM yesterday.   Continue to monitor.  Filiberto Pinks, RN

## 2020-11-27 NOTE — Progress Notes (Signed)
CARDIAC REHAB PHASE I   PRE:  Rate/Rhythm: 106 ST    BP: sitting 139/103    SaO2: 92 RA  MODE:  Ambulation: 1000 ft   POST:  Rate/Rhythm: 115 ST    BP: sitting 131/99     SaO2: 96 RA  Pt working up phlegm. Ambulated 300 ft then had good production and felt better. Increased walking distance, no other c/o. DBP still elevated. Return to recliner. Discussed IS (getting 1000 mL), sternal precautions, supervision at home, exercise, and CRPII. Pt voiced understanding but not interested in CRPII. Gave brochure in case he changes his mind. Gave instructions for d/c video for later. [id number]   Harriet Masson CES, ACSM 11/27/2020 10:06 AM

## 2020-11-27 NOTE — Progress Notes (Addendum)
[address]       [place] [address]             [phone]      4 Days Post-Op Procedure(s) (LRB): AORTIC VALVE REPLACEMENT (AVR) USING EDWARDS INSPIRIS RESILIA 25 MM AORTIC VALVE (N/A) TRANSESOPHAGEAL ECHOCARDIOGRAM (TEE) (N/A) Subjective: Feels ok, had BM  Objective: Vital signs in last 24 hours: Temp:  [97.9 F (36.6 C)-99.1 F (37.3 C)] 99.1 F (37.3 C) (01/07 0330) Pulse Rate:  [94-109] 100 (01/07 0330) Cardiac Rhythm: Normal sinus rhythm (01/07 0325) Resp:  [18-24] 18 (01/07 0330) BP: (111-144)/(76-98) 144/91 (01/07 0330) SpO2:  [72 %-96 %] 93 % (01/07 0330) Weight:  [96.6 kg] 96.6 kg (01/07 0330)  Hemodynamic parameters for last 24 hours:    Intake/Output from previous day: 01/06 0701 - 01/07 0700 In: 370 [P.O.:370] Out: 2000 [Urine:2000] Intake/Output this shift: No intake/output data recorded.  General appearance: alert, cooperative and no distress Heart: regular rate and rhythm Lungs: clear to auscultation bilaterally Abdomen: benign Extremities: trace edema Wound: incis healing well  Lab Results: Recent Labs    11/25/20 0423 11/27/20 0318  WBC 17.7* 12.8*  HGB 10.0* 9.6*  HCT 28.5* 27.4*  PLT 113* 193   BMET:  Recent Labs    11/25/20 0423 11/27/20 0318  NA 131* 134*  K 3.9 3.2*  CL 98 98  CO2 26 25  GLUCOSE 129* 107*  BUN 18 16  CREATININE 1.54* 1.50*  CALCIUM 8.0* 8.4*    PT/INR: No results for input(s): LABPROT, INR in the last 72 hours. ABG    Component Value Date/Time   PHART 7.328 (L) 11/23/2020 1811   HCO3 23.3 11/23/2020 1811   TCO2 25 11/23/2020 1811   ACIDBASEDEF 3.0 (H) 11/23/2020 1811   O2SAT 96.0 11/23/2020 1811   CBG (last 3)  Recent Labs    11/24/20 2350 11/25/20 0417 11/25/20 0644  GLUCAP 116* 122* 100*    Meds Scheduled Meds: . allopurinol  300 mg Oral Daily  . aspirin EC  325 mg Oral Daily  . carvedilol  12.5 mg Oral BID WC  . dextromethorphan-guaiFENesin  1 tablet Oral  BID  . enoxaparin (LOVENOX) injection  40 mg Subcutaneous QHS  . hydrochlorothiazide  12.5 mg Oral Daily  . mouth rinse  15 mL Mouth Rinse BID  . pantoprazole  40 mg Oral QAC breakfast  . rosuvastatin  10 mg Oral Daily  . sodium chloride flush  3 mL Intravenous Q12H   Continuous Infusions: . sodium chloride     PRN Meds:.sodium chloride, acetaminophen, ondansetron **OR** ondansetron (ZOFRAN) IV, oxyCODONE, phenol, senna-docusate, sodium chloride flush, traMADol  Xrays No results found.  Assessment/Plan: S/P Procedure(s) (LRB): AORTIC VALVE REPLACEMENT (AVR) USING EDWARDS INSPIRIS RESILIA 25 MM AORTIC VALVE (N/A) TRANSESOPHAGEAL ECHOCARDIOGRAM (TEE) (N/A)   1 Tmax 99.1, VSS, BP up  at times, sinus rhythm/sinus tachy, some PVC's- coreg uptitrated to 12.5 bid- may need higher dose but will see hom it goes this am 2 sats ok on RA 3 leukocytosis trend conts to improve 4 ABLA pretty stable, equalibrating 5 thrombocytopenia resolved 6 hypokalemia- replace, creat is stable at 1.5 7 poss d/c epw's later  today 8 cont pulm toilet/rehab 9 BS well controlled- no DM  10 hopefully home in am     LOS: 4 days    Rowe Clack PA-C pager [phone] 11/27/2020   Chart reviewed, patient examined, agree with above. Still coughing up some phlegm and  working hard on IS. Will repeat 2V CXR in am since last CXR showed some bibasilar atelectasis and small left effusion. Stable in sinus rhythm. BP a little high at times. Will see how his BP does for a couple weeks on 12.5 bid Coreg before increasing further. I will follow this up in the office. His wt is below preop and has diuresed well. Since severe AI is fixed he may not need a diuretic so will stop HCTZ.  DC pacing wires today. If he feels well tomorrow he can go home.

## 2020-11-27 NOTE — Progress Notes (Signed)
Pacing wires removed by order at 1050.  Patient tolerated procedure well some small bleeding on left side pacing wires, placed 4 x 4 and tape.  Bedrest fpr 1 hr, VS q15 min x 4.

## 2020-11-28 ENCOUNTER — Inpatient Hospital Stay (HOSPITAL_COMMUNITY): Payer: Managed Care, Other (non HMO)

## 2020-11-28 LAB — BASIC METABOLIC PANEL
Anion gap: 9 (ref 5–15)
BUN: 21 mg/dL (ref 8–23)
CO2: 25 mmol/L (ref 22–32)
Calcium: 8.4 mg/dL — ABNORMAL LOW (ref 8.9–10.3)
Chloride: 100 mmol/L (ref 98–111)
Creatinine, Ser: 1.67 mg/dL — ABNORMAL HIGH (ref 0.61–1.24)
GFR, Estimated: 46 mL/min — ABNORMAL LOW (ref >60)
Glucose, Bld: 109 mg/dL — ABNORMAL HIGH (ref 70–99)
Potassium: 3.8 mmol/L (ref 3.5–5.1)
Sodium: 134 mmol/L — ABNORMAL LOW (ref 135–145)

## 2020-11-28 MED ORDER — ASPIRIN 325 MG PO TBEC: 325.0000 mg | DELAYED_RELEASE_TABLET | Freq: Every day | ORAL | Status: DC

## 2020-11-28 MED ORDER — CARVEDILOL 12.5 MG PO TABS: 12.5000 mg | ORAL_TABLET | Freq: Two times a day (BID) | ORAL | 1 refills | Status: DC

## 2020-11-28 MED ORDER — TRAMADOL HCL 50 MG PO TABS: 50.0000 mg | ORAL_TABLET | Freq: Four times a day (QID) | ORAL | 0 refills | Status: DC | PRN

## 2020-11-28 NOTE — Progress Notes (Signed)
Patient discharged home. AVS given with patient education provided. PIV removed, telemetry d/c, with telemetry box returned to unit bin, all questions addressed.  Transported by wheelchair to [employer].

## 2020-11-28 NOTE — Progress Notes (Signed)
[address]       [place] [address]             [phone]      5 Days Post-Op Procedure(s) (LRB): AORTIC VALVE REPLACEMENT (AVR) USING EDWARDS INSPIRIS RESILIA 25 MM AORTIC VALVE (N/A) TRANSESOPHAGEAL ECHOCARDIOGRAM (TEE) (N/A) Subjective: Feels well, no specific c/o  Objective: Vital signs in last 24 hours: Temp:  [98 F (36.7 C)-98.4 F (36.9 C)] 98 F (36.7 C) (01/08 0647) Pulse Rate:  [91-100] 100 (01/08 0647) Cardiac Rhythm: Normal sinus rhythm (01/07 2100) Resp:  [16-20] 18 (01/08 0708) BP: (114-147)/(81-107) 147/107 (01/08 0647) SpO2:  [94 %-98 %] 98 % (01/08 0647) Weight:  [95.7 kg] 95.7 kg (01/08 0708)  Hemodynamic parameters for last 24 hours:    Intake/Output from previous day: No intake/output data recorded. Intake/Output this shift: No intake/output data recorded.  General appearance: alert, cooperative and no distress Heart: regular rate and rhythm Lungs: clear to auscultation bilaterally Abdomen: benign Extremities: no edema Wound: incis healing well  Lab Results: Recent Labs    11/27/20 0318  WBC 12.8*  HGB 9.6*  HCT 27.4*  PLT 193   BMET:  Recent Labs    11/27/20 0318 11/28/20 0137  NA 134* 134*  K 3.2* 3.8  CL 98 100  CO2 25 25  GLUCOSE 107* 109*  BUN 16 21  CREATININE 1.50* 1.67*  CALCIUM 8.4* 8.4*    PT/INR: No results for input(s): LABPROT, INR in the last 72 hours. ABG    Component Value Date/Time   PHART 7.328 (L) 11/23/2020 1811   HCO3 23.3 11/23/2020 1811   TCO2 25 11/23/2020 1811   ACIDBASEDEF 3.0 (H) 11/23/2020 1811   O2SAT 96.0 11/23/2020 1811   CBG (last 3)  No results for input(s): GLUCAP in the last 72 hours.  Meds Scheduled Meds: . allopurinol  300 mg Oral Daily  . aspirin EC  325 mg Oral Daily  . carvedilol  12.5 mg Oral BID WC  . dextromethorphan-guaiFENesin  1 tablet Oral BID  . enoxaparin (LOVENOX) injection  40 mg Subcutaneous QHS  . mouth rinse  15 mL Mouth Rinse BID   . pantoprazole  40 mg Oral QAC breakfast  . rosuvastatin  10 mg Oral Daily  . sodium chloride flush  3 mL Intravenous Q12H   Continuous Infusions: . sodium chloride     PRN Meds:.sodium chloride, acetaminophen, ondansetron **OR** ondansetron (ZOFRAN) IV, oxyCODONE, phenol, senna-docusate, sodium chloride flush, traMADol  Xrays DG Chest 2 View  Result Date: 11/28/2020 CLINICAL DATA:  Post AVR EXAM: CHEST - 2 VIEW COMPARISON:  Radiograph 11/25/2020 FINDINGS: Postsurgical changes from prior sternotomy and bioprosthetic aortic valve replacement. Diminished postoperative mediastinal widening. Diminishing bilateral effusions as well. Improved lung aeration of the with some persistent areas of atelectatic change and patchy opacities, possibly some mild residual edema. No pneumothorax. No new consolidative opacity. No other acute or worrisome osseous or soft tissue abnormality of the chest. Telemetry leads overlie the chest wall. IMPRESSION: Satisfactory postoperative appearance following sternotomy and a bioprosthetic aortic valve replacement. Overall improving aeration, diminishing effusions and only mild residual atelectasis and minimal edematous features. Electronically Signed   By: Kreg Shropshire M.D.   On: 11/28/2020 06:28    Assessment/Plan: S/P Procedure(s) (LRB): AORTIC VALVE REPLACEMENT (AVR) USING EDWARDS INSPIRIS RESILIA 25 MM AORTIC VALVE (N/A) TRANSESOPHAGEAL ECHOCARDIOGRAM (TEE) (N/A)   1 afeb, VSS- BP control is mostly pretty good with some sBP in 140's, Occas dBP>90  2 sats good on RA 3 creat jumped a little today to 1.67, still a bit lower than baseline CKD stage 3 4 CXR- improving overall appearance  5 stable for d/chome today    LOS: 5 days    Rowe Clack PA-C Pager [phone] 11/28/2020

## 2020-12-02 ENCOUNTER — Other Ambulatory Visit: Payer: Self-pay

## 2020-12-02 ENCOUNTER — Ambulatory Visit (INDEPENDENT_AMBULATORY_CARE_PROVIDER_SITE_OTHER): Payer: Self-pay | Admitting: *Deleted

## 2020-12-02 VITALS — BP 130/86

## 2020-12-02 DIAGNOSIS — Z4802 Encounter for removal of sutures: Secondary | ICD-10-CM

## 2020-12-02 NOTE — Progress Notes (Signed)
CARDIOLOGY OFFICE NOTE  Date:  12/16/2020    Eulas Post Date of Birth: 27-Jan-1956 Medical Record #[id number]  PCP:  Elias Else, MD  Cardiologist:  Katrinka Blazing    Chief Complaint  Patient presents with  . Hospitalization Follow-up    Post AVR - seen for Dr. Katrinka Blazing    History of Present Illness: Eric [name] is a 65 y.o. male who presents today for a follow up visit. Seen for Dr. Katrinka Blazing.   He has a history of HTN, CKD, LVH and severe AI - last echo in October of 2021 showed a left ventricular ejection fraction of 55 to 60% with moderate LVH with grade 2 diastolic dysfunction. The aortic valve appeared trileaflet with severe regurgitation. Noted dilated aorta.   Last seen by Dr. Katrinka Blazing in October - echo reviewed - referred for cath and subsequently had AVR earlier this month per Dr. Laneta Simmers. Post op course was unremarkable. CTA done for evaluation of the coronaries.   Comes in today. Here alone. Doing well. Has follow up echo scheduled in early March. Below his pre op weight. He notes his pain has improved. He would coughing previously - this is improved. Doing about 1800 on his IS. He is walking some - not last week with the snow - he has a long driveway. Not lightheaded or dizzy. No syncope. Using prn Tylenol. On full dose aspirin right now. Seems surprised that he is on aspirin and surprised about the discussion for SBE going forward.   Past Medical History:  Diagnosis Date  . Allergy    ALLERGIC RHINITIS..WORSE SPRING/FALL  . Aortic valve regurgitation 07/10/14   ECHO @ [facility]  . Arthritis   . Chronic kidney disease    Stage 3 , followed by [facility]  . GERD (gastroesophageal reflux disease)    CURRENTLY DIET CONTROLLED  . Heart murmur   . Hypertension   . Pneumonia 06/2019  . Sinusitis, chronic     Past Surgical History:  Procedure Laterality Date  . AORTIC VALVE REPLACEMENT N/A 11/23/2020   Procedure: AORTIC VALVE REPLACEMENT (AVR) USING EDWARDS INSPIRIS  RESILIA 25 MM AORTIC VALVE;  Surgeon: Alleen Borne, MD;  Location: [facility] OR;  Service: Open Heart Surgery;  Laterality: N/A;  . COLONOSCOPY    . KNEE ARTHROSCOPY Bilateral   . LASIK    . TEE WITHOUT CARDIOVERSION N/A 11/23/2020   Procedure: TRANSESOPHAGEAL ECHOCARDIOGRAM (TEE);  Surgeon: Alleen Borne, MD;  Location: [facility] OR;  Service: Open Heart Surgery;  Laterality: N/A;     Medications: Current Meds  Medication Sig  . acetaminophen (TYLENOL) 500 MG tablet Take 1,000 mg by mouth every 6 (six) hours as needed for moderate pain.  Marland Kitchen allopurinol (ZYLOPRIM) 300 MG tablet Take 300 mg by mouth daily.  Marland Kitchen aspirin EC 325 MG EC tablet Take 1 tablet (325 mg total) by mouth daily.  . carvedilol (COREG) 6.25 MG tablet Take 1 tablet (6.25 mg total) by mouth 2 (two) times daily.  . cetirizine (ZYRTEC) 10 MG tablet Take 10 mg by mouth as needed for allergies.  . Multiple Vitamins-Iron (MULTIVITAMIN/IRON PO) Take 1 tablet by mouth daily.  . rosuvastatin (CRESTOR) 10 MG tablet Take 1 tablet (10 mg total) by mouth daily.  . [DISCONTINUED] carvedilol (COREG) 12.5 MG tablet Take 1 tablet (12.5 mg total) by mouth 2 (two) times daily with a meal.     Allergies: Allergies  Allergen Reactions  . Amlodipine Cough    Social  History: The patient  reports that he has never smoked. He has never used smokeless tobacco. He reports current alcohol use of about 2.0 standard drinks of alcohol per week. He reports that he does not use drugs.   Family History: The patient's family history includes Cancer in his brother, father, and maternal grandmother; Diabetes in his brother and daughter; Heart disease in his maternal grandmother; Hypertension in his mother.   Review of Systems: Please see the history of present illness.   All other systems are reviewed and negative.   Physical Exam: VS:  BP 130/80   Pulse (!) 57   Ht 5\' 10"  (1.778 m)   Wt 209 lb 12.8 oz (95.2 kg)   SpO2 98%   BMI 30.10 kg/m  .  BMI Body  mass index is 30.1 kg/m.  Wt Readings from Last 3 Encounters:  12/16/20 209 lb 12.8 oz (95.2 kg)  11/28/20 211 lb (95.7 kg)  11/19/20 220 lb 8 oz (100 kg)    General: Alert and in no acute distress.  His weight is down from his last visit here in October.  Cardiac: Irregular rhythm. Rate is fine. Very soft outflow murmur. Sternum looks great. No edema.  Respiratory:  Lungs are clear to auscultation bilaterally with normal work of breathing.  GI: Soft and nontender.  MS: No deformity or atrophy. Gait and ROM intact.  Skin: Warm and dry. Color is normal.  Neuro:  Strength and sensation are intact and no gross focal deficits noted.  Psych: Alert, appropriate and with normal affect.   LABORATORY DATA:  EKG:  EKG is ordered today.  Personally reviewed by me and Dr. November. This demonstrates Mobitz 1 AV block - HR is 57.  Lab Results  Component Value Date   WBC 12.8 (H) 11/27/2020   HGB 9.6 (L) 11/27/2020   HCT 27.4 (L) 11/27/2020   PLT 193 11/27/2020   GLUCOSE 109 (H) 11/28/2020   CHOL 144 05/15/2020   TRIG 58 05/15/2020   HDL 55 05/15/2020   LDLCALC 77 05/15/2020   ALT 28 11/19/2020   AST 28 11/19/2020   NA 134 (L) 11/28/2020   K 3.8 11/28/2020   CL 100 11/28/2020   CREATININE 1.67 (H) 11/28/2020   BUN 21 11/28/2020   CO2 25 11/28/2020   INR 1.4 (H) 11/23/2020   HGBA1C 5.6 11/19/2020       BNP (last 3 results) No results for input(s): BNP in the last 8760 hours.  ProBNP (last 3 results) No results for input(s): PROBNP in the last 8760 hours.   Other Studies Reviewed Today:  AVR Procedure:   1. Median Sternotomy 2. Extracorporeal circulation 3.   Aortic valve replacement using a 25 mm Edwards INSPIRIS RESILIA pericardial valve.     CARDIAC CTA IMPRESSION 10/2020: 1. Coronary calcium score of 32. This was 48 percentile for age and sex matched control.  2. Normal coronary origin with right dominance.  3. CAD-RADS 1. 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 Consider preventive therapy and risk factor modification.  4. Upper normal size of the ascending aorta with maximum diameter 40 mm. Minimal atherosclerotic plaque and calcifications. No dissection.  5. Aortic Valve: Trileaflet with thickened leaflets and central non-coaptation. No calcifications.   Electronically Signed   By: 71   On: 10/28/2020 13:15  Assessment & Plan:  1. Recent AVR - #25 Edwards pericardial valve - doing well - will check lab today. Cutting Coreg  back due to AV block noted on EKG. Lab today. Otherwise, he is felt to be doing well. Discussed need for aspirin and SBE.  I get the feeling this will need to be emphasized greatly on follow up visits.   2. Mobitz 1 AV block - HR is ok - discussed with Dr. Katrinka Blazing - will cut Coreg back - get 7 day monitor - he is not symptomatic.   3. HTN - BP is ok on current regimen.   4. HLD - on statin  5. CKD - followed by [facility].   6. Chronic diastolic dysfunction.    Current medicines are reviewed with the patient today.  The patient does not have concerns regarding medicines other than what has been noted above.  The following changes have been made:  See above.  Labs/ tests ordered today include:    Orders Placed This Encounter  Procedures  . Basic metabolic panel  . CBC  . LONG TERM MONITOR (3-14 DAYS)  . EKG 12-Lead     Disposition:   FU with Dr. Katrinka Blazing after echo. Lab today. Monitor being placed. Coreg cut back.     Patient is agreeable to this plan and will call if any problems develop in the interim.   SignedNorma Fredrickson, NP  12/16/2020 10:08 AM  [facility] [address] [place], [place]  [address] Phone: [phone] Fax: [phone]

## 2020-12-02 NOTE — Progress Notes (Signed)
Patient arrived for nurse visit to remove sutures post-AVR 11/23/20 by Dr. Laneta Simmers.  Two sutures removed with no signs or symptoms of infection noted.  Incisions well approximated.  Patient tolerated suture removal well.  Patient instructed to keep the incision site clean and dry.  Patient's only concern was regarding his blood pressures. At visit BP 130/86. Pt states his pressures run anywhere from 112-140/80-92.  Encouraged pt to continue to monitor his pressures at home and to take his medications as prescribed. Pt to call the office if pressures continue to rise. Patient acknowledged instructions given.  All questions answered.

## 2020-12-07 ENCOUNTER — Telehealth: Payer: Self-pay | Admitting: *Deleted

## 2020-12-07 NOTE — Telephone Encounter (Signed)
Eric [name] contacted the office stating he has chest congestion. Pt states he was on Mucinex post operatively in the hospital. Advised pt he can purchase Mucinex over the counter at any local drug store. Also advised pt to drink plenty of water throughout the day to help loosen the secretions, brace with his heart lung pillow, and use his IS. Pt verbalized understanding.

## 2020-12-16 ENCOUNTER — Encounter: Payer: Self-pay | Admitting: Nurse Practitioner

## 2020-12-16 ENCOUNTER — Ambulatory Visit (INDEPENDENT_AMBULATORY_CARE_PROVIDER_SITE_OTHER): Payer: Managed Care, Other (non HMO) | Admitting: Nurse Practitioner

## 2020-12-16 ENCOUNTER — Other Ambulatory Visit: Payer: Self-pay

## 2020-12-16 ENCOUNTER — Ambulatory Visit (INDEPENDENT_AMBULATORY_CARE_PROVIDER_SITE_OTHER): Payer: Managed Care, Other (non HMO)

## 2020-12-16 VITALS — BP 130/80 | HR 57 | Ht 70.0 in | Wt 209.8 lb

## 2020-12-16 DIAGNOSIS — I441 Atrioventricular block, second degree: Secondary | ICD-10-CM | POA: Diagnosis not present

## 2020-12-16 DIAGNOSIS — Z952 Presence of prosthetic heart valve: Secondary | ICD-10-CM | POA: Diagnosis not present

## 2020-12-16 MED ORDER — CARVEDILOL 6.25 MG PO TABS: 6.2500 mg | ORAL_TABLET | Freq: Two times a day (BID) | ORAL | 3 refills | Status: DC

## 2020-12-16 NOTE — Patient Instructions (Addendum)
After Visit Summary:  We will be checking the following labs today - BMET & CBC   Medication Instructions:    Continue with your current medicines. BUT  I am cutting the Coreg back to 6.25 mg twice a day (1/2 a tablet twice a day) - I sent in the 6.25 mg prescription to your pharmacy  Stay on the aspirin  You will need to take antibiotics for all dental procedures going forward.    If you need a refill on your cardiac medications before your next appointment, please call your pharmacy.     Testing/Procedures To Be Arranged:  Echocardiogram as planned  7 day monitor  Follow-Up:   See Dr. Katrinka Blazing after echo in March    At [facility], you and your health needs are our priority.  As part of our continuing mission to provide you with exceptional heart care, we have created designated Provider Care Teams.  These Care Teams include your primary Cardiologist (physician) and Advanced Practice Providers (APPs -  Physician Assistants and Nurse Practitioners) who all work together to provide you with the care you need, when you need it.  Special Instructions:  . Stay safe, wash your hands for at least 20 seconds and wear a mask when needed.  . It was good to talk with you today.    Call the [facility] office at [phone] if you have any questions, problems or concerns.

## 2020-12-17 LAB — BASIC METABOLIC PANEL
BUN/Creatinine Ratio: 11 (ref 10–24)
BUN: 18 mg/dL (ref 8–27)
CO2: 22 mmol/L (ref 20–29)
Calcium: 9.3 mg/dL (ref 8.6–10.2)
Chloride: 105 mmol/L (ref 96–106)
Creatinine, Ser: 1.71 mg/dL — ABNORMAL HIGH (ref 0.76–1.27)
GFR calc Af Amer: 48 mL/min/{1.73_m2} — ABNORMAL LOW (ref >59)
GFR calc non Af Amer: 42 mL/min/{1.73_m2} — ABNORMAL LOW (ref >59)
Glucose: 109 mg/dL — ABNORMAL HIGH (ref 65–99)
Potassium: 4.3 mmol/L (ref 3.5–5.2)
Sodium: 141 mmol/L (ref 134–144)

## 2020-12-17 LAB — CBC
Hematocrit: 30.8 % — ABNORMAL LOW (ref 37.5–51.0)
Hemoglobin: 10.2 g/dL — ABNORMAL LOW (ref 13.0–17.7)
MCH: 23.9 pg — ABNORMAL LOW (ref 26.6–33.0)
MCHC: 33.1 g/dL (ref 31.5–35.7)
MCV: 72 fL — ABNORMAL LOW (ref 79–97)
Platelets: 414 10*3/uL (ref 150–450)
RBC: 4.26 x10E6/uL (ref 4.14–5.80)
RDW: 13.7 % (ref 11.6–15.4)
WBC: 9.1 10*3/uL (ref 3.4–10.8)

## 2020-12-22 ENCOUNTER — Other Ambulatory Visit: Payer: Self-pay | Admitting: Surgery

## 2020-12-22 DIAGNOSIS — Z952 Presence of prosthetic heart valve: Secondary | ICD-10-CM

## 2020-12-23 ENCOUNTER — Ambulatory Visit
Admission: RE | Admit: 2020-12-23 | Discharge: 2020-12-23 | Disposition: A | Discharge status: Home / Self Care | Payer: Managed Care, Other (non HMO) | Source: Ambulatory Visit | Attending: Surgery | Admitting: Surgery

## 2020-12-23 ENCOUNTER — Ambulatory Visit (INDEPENDENT_AMBULATORY_CARE_PROVIDER_SITE_OTHER): Payer: Self-pay | Admitting: Surgical

## 2020-12-23 ENCOUNTER — Other Ambulatory Visit: Payer: Self-pay

## 2020-12-23 VITALS — BP 119/82 | HR 90 | Temp 97.7°F | Resp 20 | Ht 70.0 in | Wt 214.0 lb

## 2020-12-23 DIAGNOSIS — Z952 Presence of prosthetic heart valve: Secondary | ICD-10-CM

## 2020-12-23 DIAGNOSIS — I351 Nonrheumatic aortic (valve) insufficiency: Secondary | ICD-10-CM

## 2020-12-23 NOTE — Progress Notes (Signed)
[address]       [place] [address]             [phone]           [address] [place]       [place] [address]             [phone]      Eric [name] [facility] Medical Record #[id number] Date of Birth: 07-May-1956  Referring: Lyn Records, MD Primary Care: Elias Else, MD Primary Cardiologist: Lesleigh Noe, MD   Chief Complaint:   [name] OP FOLLOW UP CARDIOVASCULAR SURGERY OPERATIVE NOTE  11/23/2020 Eric [name] [id number]  Surgeon:  Alleen Borne, MD  First Assistant: Gershon Crane,  PA-C   Preoperative Diagnosis:  Severe aortic insufficiency   Postoperative Diagnosis:  Same   Procedure:  1. Median Sternotomy 2. Extracorporeal circulation 3.   Aortic valve replacement using a 25 mm Edwards INSPIRIS RESILIA pericardial valve.  Anesthesia:  General Endotracheal  History of Present Illness:    The patient is a 65 year old male status [name] the above described procedure seen in the office on today's date in routine [name] surgical follow-up.  He reports that he overall feels quite well.  He has a very mild cough which has improved over time and was present preoperatively.  He denies shortness of breath or chest pain.  He has not had any difficulties with incisional healing.  He denies palpitations or peripheral edema.  He is no longer taking any pain medications.  He continues to improve in regard to ambulation and exercise endurance.  Overall he is quite pleased with his progress.      Past Medical History:  Diagnosis Date  . Allergy    ALLERGIC RHINITIS..WORSE SPRING/FALL  . Aortic valve regurgitation 07/10/14   ECHO @ [facility]  . Arthritis   . Chronic kidney disease    Stage 3 , followed by [facility]  . GERD (gastroesophageal reflux disease)    CURRENTLY DIET CONTROLLED  . Heart murmur   . Hypertension   . Pneumonia 06/2019  . Sinusitis, chronic      Social History   Tobacco  Use  Smoking Status Never Smoker  Smokeless Tobacco Never Used    Social History   Substance and Sexual Activity  Alcohol Use Yes  . Alcohol/week: 2.0 standard drinks  . Types: 2 Standard drinks or equivalent per week   Comment: occasional     Allergies  Allergen Reactions  . Amlodipine Cough    Current Outpatient Medications  Medication Sig Dispense Refill  . acetaminophen (TYLENOL) 500 MG tablet Take 1,000 mg by mouth every 6 (six) hours as needed for moderate pain.    Marland Kitchen allopurinol (ZYLOPRIM) 300 MG tablet Take 300 mg by mouth daily.  4  . aspirin EC 325 MG EC tablet Take 1 tablet (325 mg total) by mouth daily.    . carvedilol (COREG) 6.25 MG tablet Take 1 tablet (6.25 mg total) by mouth 2 (two) times daily. 180 tablet 3  . cetirizine (ZYRTEC) 10 MG tablet Take 10 mg by mouth as needed for allergies.    . Multiple Vitamins-Iron (MULTIVITAMIN/IRON PO) Take 1 tablet by mouth daily.    . rosuvastatin (CRESTOR) 10 MG tablet Take 1 tablet (10 mg total) by mouth daily. 90 tablet 3   No current facility-administered medications for this visit.       Physical Exam: BP  119/82   Pulse 90   Temp 97.7 F (36.5 C) (Skin)   Resp 20   Ht 5\' 10"  (1.778 m)   Wt 214 lb (97.1 kg)   SpO2 97% Comment: RA  BMI 30.71 kg/m   General appearance: alert, cooperative and no distress Heart: regular rate and rhythm Lungs: clear to auscultation bilaterally Abdomen: benign Extremities: no edema Wound: Incisions well-healed without evidence of infection.   Diagnostic Studies & Laboratory data:     Recent Radiology Findings:   DG Chest 2 View  Result Date: 12/23/2020 CLINICAL DATA:  Postop aortic valve replacement surgery. EXAM: CHEST - 2 VIEW COMPARISON:  11/28/2020 FINDINGS: Stable postoperative changes. The cardiac silhouette, mediastinal and hilar contours are within normal limits and stable. Stable mild tortuosity of the thoracic aorta. The lungs are clear. No pulmonary edema,  pleural effusions or atelectasis. No pneumothorax. The bony thorax is intact. IMPRESSION: No acute cardiopulmonary findings. Electronically Signed   By: 01/26/2021 M.D.   On: 12/23/2020 11:22      Recent Lab Findings: Lab Results  Component Value Date   WBC 9.1 12/16/2020   HGB 10.2 (L) 12/16/2020   HCT 30.8 (L) 12/16/2020   PLT 414 12/16/2020   GLUCOSE 109 (H) 12/16/2020   CHOL 144 05/15/2020   TRIG 58 05/15/2020   HDL 55 05/15/2020   LDLCALC 77 05/15/2020   ALT 28 11/19/2020   AST 28 11/19/2020   NA 141 12/16/2020   K 4.3 12/16/2020   CL 105 12/16/2020   CREATININE 1.71 (H) 12/16/2020   BUN 18 12/16/2020   CO2 22 12/16/2020   INR 1.4 (H) 11/23/2020   HGBA1C 5.6 11/19/2020      Assessment / Plan: The patient is doing very well following bioprosthetic aortic valve replacement for severe AI.  His chest x-ray was reviewed and shows no significant findings.  I made no changes to his current medication regimen.  He was given verbal instructions regarding activity progression including driving.  He has already been seen by cardiology and an echocardiogram is scheduled for March.  We will see him again in the office on a as needed basis for any surgically related issues or at request.      Medication Changes: No orders of the defined types were placed in this encounter.     April, PA-C 12/23/2020 11:40 AM

## 2020-12-23 NOTE — Patient Instructions (Signed)
Discussed routine activity progression including driving. 

## 2021-01-20 ENCOUNTER — Other Ambulatory Visit: Payer: Self-pay

## 2021-01-20 ENCOUNTER — Ambulatory Visit (HOSPITAL_BASED_OUTPATIENT_CLINIC_OR_DEPARTMENT_OTHER): Payer: Managed Care, Other (non HMO)

## 2021-01-20 ENCOUNTER — Telehealth: Payer: Self-pay | Admitting: Interventional Cardiology

## 2021-01-20 DIAGNOSIS — R0789 Other chest pain: Secondary | ICD-10-CM | POA: Diagnosis present

## 2021-01-20 DIAGNOSIS — J189 Pneumonia, unspecified organism: Secondary | ICD-10-CM | POA: Diagnosis not present

## 2021-01-20 DIAGNOSIS — N183 Chronic kidney disease, stage 3 unspecified: Secondary | ICD-10-CM | POA: Diagnosis not present

## 2021-01-20 DIAGNOSIS — I350 Nonrheumatic aortic (valve) stenosis: Secondary | ICD-10-CM | POA: Insufficient documentation

## 2021-01-20 DIAGNOSIS — Z79899 Other long term (current) drug therapy: Secondary | ICD-10-CM | POA: Diagnosis not present

## 2021-01-20 DIAGNOSIS — Z8616 Personal history of COVID-19: Secondary | ICD-10-CM | POA: Diagnosis not present

## 2021-01-20 DIAGNOSIS — Z20822 Contact with and (suspected) exposure to covid-19: Secondary | ICD-10-CM | POA: Diagnosis not present

## 2021-01-20 DIAGNOSIS — I509 Heart failure, unspecified: Secondary | ICD-10-CM | POA: Diagnosis not present

## 2021-01-20 DIAGNOSIS — I130 Hypertensive heart and chronic kidney disease with heart failure and stage 1 through stage 4 chronic kidney disease, or unspecified chronic kidney disease: Secondary | ICD-10-CM | POA: Diagnosis not present

## 2021-01-20 LAB — ECHOCARDIOGRAM COMPLETE
AR max vel: 1.09 cm2
AV Area VTI: 1.13 cm2
AV Area mean vel: 1.01 cm2
AV Mean grad: 16 mmHg
AV Peak grad: 25 mmHg
Ao pk vel: 2.5 m/s
Area-P 1/2: 4.33 cm2
S' Lateral: 3.4 cm

## 2021-01-20 NOTE — Telephone Encounter (Signed)
Pt c/o of Chest Pain: STAT if CP now or developed within 24 hours  1. Are you having CP right now? No, patient states it is a back pain that radiates up to his chest  2. Are you experiencing any other symptoms (ex. SOB, nausea, vomiting, sweating)? Back pain, elevated BP  3. How long have you been experiencing CP? About 4-5 days, per patient  4. Is your CP continuous or coming and going? Coming and going  5. Have you taken Nitroglycerin? No    Patient would also like to discuss his echo scheduled for today, 01/20/21 at 11:30 AM. He would like to know if someone can take his vitals during this visit to ensure that everything is alright. ?

## 2021-01-20 NOTE — Telephone Encounter (Signed)
Pt does have a hx of back pain in the past with sleeping on soft mattresses or long car rides.  Pt traveled over the weekend and was in the car for 3+ hours.  Has had back pain that radiates into the chest for 4-5 days now.  It's a sharp pain that is constant but pt does not feel it is strong enough to warrant need a Nitro (which he doesn't have).  Nothing makes it better or worse.  Denies SOB, HA, N/V, dizziness or any other cardiac issues.  Has taken Tylenol with no relief.  Pt has appt for echo this morning.  BP today on his monitor was 138/103 but pt is unsure if cuff is working properly.  Advised I will send message to Dr. Katrinka Blazing for review and to keep echo appt.

## 2021-01-20 NOTE — Telephone Encounter (Signed)
Is this low back pain or upper back pain? Has he had similar pain in past?  Okay to try Ibuprofen  400 mg with food every 8 hours prn for 2 days only. If low back pain, needs to discuss with PCP>

## 2021-01-20 NOTE — Telephone Encounter (Signed)
Pt states pain is in the upper back, runs across his shoulder blades.  Has had similar pain with sleeping on soft mattresses and long car rides but not as strong as current pain.  Pt agreeable to try Ibuprofen.  Made pt aware I'm going to update Dr. Katrinka Blazing and if any further recommendations prior to looking at echo, I would be in touch.

## 2021-01-21 NOTE — Telephone Encounter (Signed)
Should speak to PCP. Lets see how IBUPRO. works.

## 2021-01-22 ENCOUNTER — Other Ambulatory Visit: Payer: Self-pay

## 2021-01-22 ENCOUNTER — Encounter (HOSPITAL_COMMUNITY): Payer: Self-pay | Admitting: Pharmacy Technician

## 2021-01-22 ENCOUNTER — Ambulatory Visit (HOSPITAL_COMMUNITY)
Admission: EM | Admit: 2021-01-22 | Discharge: 2021-01-22 | Disposition: A | Discharge status: Home / Self Care | Payer: Managed Care, Other (non HMO)

## 2021-01-22 ENCOUNTER — Emergency Department (HOSPITAL_COMMUNITY): Payer: Managed Care, Other (non HMO)

## 2021-01-22 ENCOUNTER — Observation Stay (HOSPITAL_COMMUNITY)
Admission: EM | Admit: 2021-01-22 | Discharge: 2021-01-24 | Disposition: A | Discharge status: Home / Self Care | Payer: Managed Care, Other (non HMO) | Attending: Internal Medicine | Admitting: Internal Medicine

## 2021-01-22 DIAGNOSIS — Z20822 Contact with and (suspected) exposure to covid-19: Secondary | ICD-10-CM | POA: Insufficient documentation

## 2021-01-22 DIAGNOSIS — E785 Hyperlipidemia, unspecified: Secondary | ICD-10-CM | POA: Diagnosis present

## 2021-01-22 DIAGNOSIS — Z800 Family history of malignant neoplasm of digestive organs: Secondary | ICD-10-CM

## 2021-01-22 DIAGNOSIS — Z79899 Other long term (current) drug therapy: Secondary | ICD-10-CM | POA: Insufficient documentation

## 2021-01-22 DIAGNOSIS — J189 Pneumonia, unspecified organism: Principal | ICD-10-CM

## 2021-01-22 DIAGNOSIS — Z8249 Family history of ischemic heart disease and other diseases of the circulatory system: Secondary | ICD-10-CM

## 2021-01-22 DIAGNOSIS — Z7982 Long term (current) use of aspirin: Secondary | ICD-10-CM

## 2021-01-22 DIAGNOSIS — Z952 Presence of prosthetic heart valve: Secondary | ICD-10-CM

## 2021-01-22 DIAGNOSIS — I712 Thoracic aortic aneurysm, without rupture: Secondary | ICD-10-CM | POA: Diagnosis present

## 2021-01-22 DIAGNOSIS — Z833 Family history of diabetes mellitus: Secondary | ICD-10-CM

## 2021-01-22 DIAGNOSIS — A419 Sepsis, unspecified organism: Principal | ICD-10-CM | POA: Diagnosis present

## 2021-01-22 DIAGNOSIS — N179 Acute kidney failure, unspecified: Secondary | ICD-10-CM | POA: Diagnosis present

## 2021-01-22 DIAGNOSIS — M109 Gout, unspecified: Secondary | ICD-10-CM | POA: Diagnosis present

## 2021-01-22 DIAGNOSIS — N183 Chronic kidney disease, stage 3 unspecified: Secondary | ICD-10-CM | POA: Diagnosis present

## 2021-01-22 DIAGNOSIS — N182 Chronic kidney disease, stage 2 (mild): Secondary | ICD-10-CM | POA: Diagnosis present

## 2021-01-22 DIAGNOSIS — I10 Essential (primary) hypertension: Secondary | ICD-10-CM | POA: Diagnosis present

## 2021-01-22 DIAGNOSIS — D509 Iron deficiency anemia, unspecified: Secondary | ICD-10-CM | POA: Diagnosis present

## 2021-01-22 DIAGNOSIS — N189 Chronic kidney disease, unspecified: Secondary | ICD-10-CM

## 2021-01-22 DIAGNOSIS — Z8616 Personal history of COVID-19: Secondary | ICD-10-CM | POA: Insufficient documentation

## 2021-01-22 DIAGNOSIS — I130 Hypertensive heart and chronic kidney disease with heart failure and stage 1 through stage 4 chronic kidney disease, or unspecified chronic kidney disease: Secondary | ICD-10-CM | POA: Insufficient documentation

## 2021-01-22 DIAGNOSIS — R079 Chest pain, unspecified: Secondary | ICD-10-CM

## 2021-01-22 DIAGNOSIS — M62838 Other muscle spasm: Secondary | ICD-10-CM | POA: Diagnosis present

## 2021-01-22 DIAGNOSIS — I129 Hypertensive chronic kidney disease with stage 1 through stage 4 chronic kidney disease, or unspecified chronic kidney disease: Secondary | ICD-10-CM | POA: Diagnosis present

## 2021-01-22 DIAGNOSIS — I248 Other forms of acute ischemic heart disease: Secondary | ICD-10-CM | POA: Diagnosis present

## 2021-01-22 DIAGNOSIS — I509 Heart failure, unspecified: Secondary | ICD-10-CM | POA: Insufficient documentation

## 2021-01-22 LAB — BASIC METABOLIC PANEL
Anion gap: 10 (ref 5–15)
BUN: 20 mg/dL (ref 8–23)
CO2: 23 mmol/L (ref 22–32)
Calcium: 9.1 mg/dL (ref 8.9–10.3)
Chloride: 103 mmol/L (ref 98–111)
Creatinine, Ser: 1.71 mg/dL — ABNORMAL HIGH (ref 0.61–1.24)
GFR, Estimated: 44 mL/min — ABNORMAL LOW (ref >60)
Glucose, Bld: 143 mg/dL — ABNORMAL HIGH (ref 70–99)
Potassium: 3.8 mmol/L (ref 3.5–5.1)
Sodium: 136 mmol/L (ref 135–145)

## 2021-01-22 LAB — CBC
HCT: 33 % — ABNORMAL LOW (ref 39.0–52.0)
Hemoglobin: 11.5 g/dL — ABNORMAL LOW (ref 13.0–17.0)
MCH: 24.7 pg — ABNORMAL LOW (ref 26.0–34.0)
MCHC: 34.8 g/dL (ref 30.0–36.0)
MCV: 70.8 fL — ABNORMAL LOW (ref 80.0–100.0)
Platelets: 264 10*3/uL (ref 150–400)
RBC: 4.66 MIL/uL (ref 4.22–5.81)
RDW: 14.9 % (ref 11.5–15.5)
WBC: 13.6 10*3/uL — ABNORMAL HIGH (ref 4.0–10.5)
nRBC: 0 % (ref 0.0–0.2)

## 2021-01-22 LAB — LACTIC ACID, PLASMA: Lactic Acid, Venous: 1.4 mmol/L (ref 0.5–1.9)

## 2021-01-22 LAB — RESP PANEL BY RT-PCR (FLU A&B, COVID) ARPGX2
Influenza A by PCR: NEGATIVE
Influenza B by PCR: NEGATIVE
SARS Coronavirus 2 by RT PCR: NEGATIVE

## 2021-01-22 LAB — TROPONIN I (HIGH SENSITIVITY)
Troponin I (High Sensitivity): 19 ng/L — ABNORMAL HIGH (ref <18)
Troponin I (High Sensitivity): 18 ng/L — ABNORMAL HIGH (ref <18)

## 2021-01-22 LAB — STREP PNEUMONIAE URINARY ANTIGEN: Strep Pneumo Urinary Antigen: NEGATIVE

## 2021-01-22 LAB — BRAIN NATRIURETIC PEPTIDE: B Natriuretic Peptide: 118.3 pg/mL — ABNORMAL HIGH (ref 0.0–100.0)

## 2021-01-22 MED ORDER — SODIUM CHLORIDE 0.9 % IV SOLN
2.0000 g | INTRAVENOUS | Status: DC
  Administered 2021-01-22 – 2021-01-23 (×2): 2 g via INTRAVENOUS
  Filled 2021-01-22 (×2): qty 20

## 2021-01-22 MED ORDER — IOHEXOL 350 MG/ML SOLN
60.0000 mL | Freq: Once | INTRAVENOUS | Status: AC | PRN
  Administered 2021-01-22: 60 mL via INTRAVENOUS

## 2021-01-22 MED ORDER — SODIUM CHLORIDE 0.9 % IV SOLN
500.0000 mg | INTRAVENOUS | Status: DC
Start: 2021-01-22 — End: 2021-01-24
  Administered 2021-01-22 – 2021-01-23 (×2): 500 mg via INTRAVENOUS
  Filled 2021-01-22 (×2): qty 500

## 2021-01-22 MED ORDER — ROSUVASTATIN CALCIUM 5 MG PO TABS
10.0000 mg | ORAL_TABLET | Freq: Every day | ORAL | Status: DC
  Administered 2021-01-23 – 2021-01-24 (×2): 10 mg via ORAL
  Filled 2021-01-22 (×2): qty 2

## 2021-01-22 MED ORDER — MORPHINE SULFATE (PF) 4 MG/ML IV SOLN
4.0000 mg | Freq: Once | INTRAVENOUS | Status: AC
  Administered 2021-01-22: 4 mg via INTRAVENOUS
  Filled 2021-01-22: qty 1

## 2021-01-22 MED ORDER — FERROUS SULFATE 325 (65 FE) MG PO TABS
325.0000 mg | ORAL_TABLET | Freq: Every day | ORAL | Status: DC
  Administered 2021-01-23 – 2021-01-24 (×2): 325 mg via ORAL
  Filled 2021-01-22 (×2): qty 1

## 2021-01-22 MED ORDER — POLYETHYLENE GLYCOL 3350 17 G PO PACK: 17.0000 g | PACK | Freq: Every day | ORAL | Status: DC | PRN

## 2021-01-22 MED ORDER — CARVEDILOL 6.25 MG PO TABS
6.2500 mg | ORAL_TABLET | Freq: Two times a day (BID) | ORAL | Status: DC
  Administered 2021-01-22 – 2021-01-24 (×4): 6.25 mg via ORAL
  Filled 2021-01-22 (×4): qty 1

## 2021-01-22 MED ORDER — SODIUM CHLORIDE 0.9 % IV BOLUS
500.0000 mL | Freq: Once | INTRAVENOUS | Status: AC
  Administered 2021-01-22: 500 mL via INTRAVENOUS

## 2021-01-22 MED ORDER — SODIUM CHLORIDE 0.9 % IV SOLN
500.0000 mg | Freq: Once | INTRAVENOUS | Status: AC
  Administered 2021-01-22: 500 mg via INTRAVENOUS
  Filled 2021-01-22: qty 500

## 2021-01-22 MED ORDER — ENOXAPARIN SODIUM 40 MG/0.4ML ~~LOC~~ SOLN
40.0000 mg | SUBCUTANEOUS | Status: DC
  Administered 2021-01-23: 40 mg via SUBCUTANEOUS
  Filled 2021-01-22: qty 0.4

## 2021-01-22 MED ORDER — MORPHINE SULFATE (PF) 4 MG/ML IV SOLN
4.0000 mg | Freq: Once | INTRAVENOUS | Status: AC
Start: 2021-01-22 — End: 2021-01-22
  Administered 2021-01-22: 4 mg via INTRAVENOUS
  Filled 2021-01-22: qty 1

## 2021-01-22 MED ORDER — ALLOPURINOL 300 MG PO TABS
300.0000 mg | ORAL_TABLET | Freq: Every day | ORAL | Status: DC
  Administered 2021-01-23 – 2021-01-24 (×2): 300 mg via ORAL
  Filled 2021-01-22 (×2): qty 1

## 2021-01-22 MED ORDER — METHOCARBAMOL 1000 MG/10ML IJ SOLN
500.0000 mg | Freq: Four times a day (QID) | INTRAVENOUS | Status: DC | PRN
  Administered 2021-01-23: 500 mg via INTRAVENOUS
  Filled 2021-01-22: qty 5
  Filled 2021-01-22: qty 500
  Filled 2021-01-22: qty 5

## 2021-01-22 MED ORDER — SODIUM CHLORIDE 0.9 % IV SOLN
1.0000 g | Freq: Once | INTRAVENOUS | Status: AC
  Administered 2021-01-22: 1 g via INTRAVENOUS
  Filled 2021-01-22: qty 10

## 2021-01-22 MED ORDER — ASPIRIN EC 325 MG PO TBEC
325.0000 mg | DELAYED_RELEASE_TABLET | Freq: Every day | ORAL | Status: DC
  Administered 2021-01-23 – 2021-01-24 (×2): 325 mg via ORAL
  Filled 2021-01-22 (×2): qty 1

## 2021-01-22 MED ORDER — ACETAMINOPHEN 500 MG PO TABS
1000.0000 mg | ORAL_TABLET | Freq: Four times a day (QID) | ORAL | Status: DC | PRN
  Administered 2021-01-22: 1000 mg via ORAL
  Filled 2021-01-22: qty 2

## 2021-01-22 MED ORDER — SODIUM CHLORIDE 0.9 % IV SOLN: INTRAVENOUS | Status: DC

## 2021-01-22 NOTE — ED Provider Notes (Signed)
[name] EMERGENCY DEPARTMENT Provider Note   CSN: [id number] Arrival date & time: 01/22/21  1044     History Chief Complaint  Patient presents with  . Chest Pain    Eric [name] is a 65 y.o. male.  He has a history of an aortic valve replacement in January.  Travel to [place] last week.  For the last 4 days has had pain across the shoulder blades and in his chest.  Associated with some labored breathing.  Pain is worse with movement and breathing.  8 out of 10 intensity.  No cough no fever known.  No vomiting or diarrhea.  Feels short of breath.  The history is provided by the patient.  Chest Pain Pain location:  Substernal area Pain quality: aching and stabbing   Pain radiates to:  Upper back Pain severity:  Severe Onset quality:  Gradual Timing:  Constant Progression:  Worsening Chronicity:  New Relieved by:  Nothing Worsened by:  Coughing, deep breathing and movement Ineffective treatments: nsaids. Associated symptoms: back pain and shortness of breath   Associated symptoms: no abdominal pain, no cough, no diaphoresis, no fever, no headache, no lower extremity edema, no nausea and no vomiting   Risk factors: high cholesterol and surgery   Risk factors: no smoking        Past Medical History:  Diagnosis Date  . Allergy    ALLERGIC RHINITIS..WORSE SPRING/FALL  . Aortic valve regurgitation 07/10/14   ECHO @ [facility]  . Arthritis   . Chronic kidney disease    Stage 3 , followed by [facility]  . GERD (gastroesophageal reflux disease)    CURRENTLY DIET CONTROLLED  . Heart murmur   . Hypertension   . Pneumonia 06/2019  . Sinusitis, chronic     Patient Active Problem List   Diagnosis Date Noted  . S/P AVR (aortic valve replacement) 11/23/2020  . Acute respiratory failure with hypoxia (HCC) 07/09/2019  . CKD (chronic kidney disease), stage III (HCC) 07/09/2019  . AKI (acute kidney injury) (HCC) 07/09/2019  . COVID-19 virus  infection 07/08/2019  . Essential hypertension 08/31/2017  . 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 GERD (gastroesophageal reflux disease)   . Allergy   . Sinusitis, chronic   . Aortic valve regurgitation 07/10/2014    Past Surgical History:  Procedure Laterality Date  . AORTIC VALVE REPLACEMENT N/A 11/23/2020   Procedure: AORTIC VALVE REPLACEMENT (AVR) USING EDWARDS INSPIRIS RESILIA 25 MM AORTIC VALVE;  Surgeon: Alleen Borne, MD;  Location: [facility] OR;  Service: Open Heart Surgery;  Laterality: N/A;  . COLONOSCOPY    . KNEE ARTHROSCOPY Bilateral   . LASIK    . TEE WITHOUT CARDIOVERSION N/A 11/23/2020   Procedure: TRANSESOPHAGEAL ECHOCARDIOGRAM (TEE);  Surgeon: Alleen Borne, MD;  Location: [facility] OR;  Service: Open Heart Surgery;  Laterality: N/A;       Family History  Problem Relation Age of Onset  . Hypertension Mother   . Cancer Father        STOMACH  . Diabetes Daughter   . Cancer Maternal Grandmother        STOMACH  . Heart disease Maternal Grandmother   . Cancer Brother        COLON  . Diabetes Brother     Social History   Tobacco Use  . Smoking status: Never Smoker  . Smokeless tobacco: Never Used  Vaping Use  . Vaping Use:  Never used  Substance Use Topics  . Alcohol use: Yes    Alcohol/week: 2.0 standard drinks    Types: 2 Standard drinks or equivalent per week    Comment: occasional  . Drug use: No    Home Medications Prior to Admission medications   Medication Sig Start Date End Date Taking? Authorizing Provider  acetaminophen (TYLENOL) 500 MG tablet Take 1,000 mg by mouth every 6 (six) hours as needed for moderate pain.    [provider]  allopurinol (ZYLOPRIM) 300 MG tablet Take 300 mg by mouth daily. 09/21/18   [provider]  aspirin EC 325 MG EC tablet Take 1 tablet (325 mg total) by mouth daily. 11/28/20   Gold, Wayne E, PA-C  carvedilol (COREG) 6.25 MG tablet Take 1 tablet (6.25 mg total) by  mouth 2 (two) times daily. 12/16/20   Rosalio Macadamia, NP  cetirizine (ZYRTEC) 10 MG tablet Take 10 mg by mouth as needed for allergies.    [provider]  Multiple Vitamins-Iron (MULTIVITAMIN/IRON PO) Take 1 tablet by mouth daily.    [provider]  rosuvastatin (CRESTOR) 10 MG tablet Take 1 tablet (10 mg total) by mouth daily. 05/18/20   Lyn Records, MD    Allergies    Amlodipine  Review of Systems   Review of Systems  Constitutional: Negative for diaphoresis and fever.  HENT: Negative for sore throat.   Eyes: Negative for visual disturbance.  Respiratory: Positive for shortness of breath. Negative for cough.   Cardiovascular: Positive for chest pain.  Gastrointestinal: Negative for abdominal pain, nausea and vomiting.  Genitourinary: Negative for dysuria.  Musculoskeletal: Positive for back pain.  Skin: Negative for rash.  Neurological: Negative for headaches.    Physical Exam Updated Vital Signs BP (!) 150/101   Pulse (!) 101   Temp (!) 100.5 F (38.1 C) (Oral)   Resp (!) 22   Ht 5\' 10"  (1.778 m)   Wt 96.2 kg   SpO2 97%   BMI 30.42 kg/m   Physical Exam Vitals and nursing note reviewed.  Constitutional:      Appearance: He is well-developed and well-nourished.  HENT:     Head: Normocephalic and atraumatic.  Eyes:     Conjunctiva/sclera: Conjunctivae normal.  Cardiovascular:     Rate and Rhythm: Regular rhythm. Tachycardia present.     Heart sounds: Normal heart sounds. No murmur heard.   Pulmonary:     Effort: Pulmonary effort is normal. Tachypnea present. No respiratory distress.     Breath sounds: Normal breath sounds.  Abdominal:     Palpations: Abdomen is soft.     Tenderness: There is no abdominal tenderness.  Musculoskeletal:        General: No edema. Normal range of motion.     Cervical back: Neck supple.     Right lower leg: No tenderness. No edema.     Left lower leg: No tenderness. No edema.  Skin:    General: Skin is  warm and dry.     Capillary Refill: Capillary refill takes less than 2 seconds.  Neurological:     General: No focal deficit present.     Mental Status: He is alert.  Psychiatric:        Mood and Affect: Mood and affect normal.     ED Results / Procedures / Treatments   Labs (all labs ordered are listed, but only abnormal results are displayed) Labs Reviewed  BASIC METABOLIC PANEL - Abnormal; Notable for  the following components:      Result Value   Glucose, Bld 143 (*)    Creatinine, Ser 1.71 (*)    GFR, Estimated 44 (*)    All other components within normal limits  CBC - Abnormal; Notable for the following components:   WBC 13.6 (*)    Hemoglobin 11.5 (*)    HCT 33.0 (*)    MCV 70.8 (*)    MCH 24.7 (*)    All other components within normal limits  BRAIN NATRIURETIC PEPTIDE - Abnormal; Notable for the following components:   B Natriuretic Peptide 118.3 (*)    All other components within normal limits  TROPONIN I (HIGH SENSITIVITY) - Abnormal; Notable for the following components:   Troponin I (High Sensitivity) 19 (*)    All other components within normal limits  TROPONIN I (HIGH SENSITIVITY) - Abnormal; Notable for the following components:   Troponin I (High Sensitivity) 18 (*)    All other components within normal limits  RESP PANEL BY RT-PCR (FLU A&B, COVID) ARPGX2  CULTURE, BLOOD (ROUTINE X 2)  CULTURE, BLOOD (ROUTINE X 2)  LACTIC ACID, PLASMA  HIV ANTIBODY (ROUTINE TESTING W REFLEX)  STREP PNEUMONIAE URINARY ANTIGEN  BASIC METABOLIC PANEL  CBC    EKG EKG Interpretation  Date/Time:  Friday January 22 2021 10:58:00 EST Ventricular Rate:  101 PR Interval:  188 QRS Duration: 88 QT Interval:  324 QTC Calculation: 420 R Axis:   79 Text Interpretation: Sinus tachycardia Minimal voltage criteria for LVH, may be normal variant ( Sokolow-Lyon ) Inferior infarct , possibly acute T wave abnormality, consider lateral ischemia Abnormal ECG No significant change since  last tracing today Confirmed by Meridee Score [phone]) on 01/22/2021 11:20:07 AM   Radiology DG Chest 2 View  Result Date: 01/22/2021 CLINICAL DATA:  Chest pain EXAM: CHEST - 2 VIEW COMPARISON:  12/23/2020 FINDINGS: . airspace opacities in both lung bases, left greater than right concerning for pneumonia. Small left effusion suspected. Heart is normal size. No acute bony abnormality. IMPRESSION: Bilateral lower lobe airspace opacities, left greater than right concerning for pneumonia. Small left effusion. Electronically Signed   By: Charlett Nose M.D.   On: 01/22/2021 11:37   CT Angio Chest/Abd/Pel for Dissection W and/or W/WO  Result Date: 01/22/2021 CLINICAL DATA:  Chest pain. EXAM: CT ANGIOGRAPHY CHEST, ABDOMEN AND PELVIS TECHNIQUE: Non-contrast CT of the chest was initially obtained. Multidetector CT imaging through the chest, abdomen and pelvis was performed using the standard protocol during bolus administration of intravenous contrast. Multiplanar reconstructed images and MIPs were obtained and reviewed to evaluate the vascular anatomy. CONTRAST:  53mL OMNIPAQUE IOHEXOL 350 MG/ML SOLN COMPARISON:  October 23, 2020. FINDINGS: CTA CHEST FINDINGS Cardiovascular: No evidence of thoracic aortic dissection. Status post aortic valve repair. 4 cm ascending thoracic aortic aneurysm is noted. Great vessels are widely patent without stenosis. Mild cardiomegaly is noted. No pericardial effusion is noted. Mediastinum/Nodes: No enlarged mediastinal, hilar, or axillary lymph nodes. Thyroid gland, trachea, and esophagus demonstrate no significant findings. Lungs/Pleura: No pneumothorax is noted. Small bilateral pleural effusions are noted with adjacent bibasilar subsegmental atelectasis or infiltrates. Musculoskeletal: No chest wall abnormality. No acute or significant osseous findings. Review of the MIP images confirms the above findings. CTA ABDOMEN AND PELVIS FINDINGS VASCULAR Aorta: Normal caliber aorta without  aneurysm, dissection, vasculitis or significant stenosis. Celiac: Patent without evidence of aneurysm, dissection, vasculitis or significant stenosis. SMA: Patent without evidence of aneurysm, dissection, vasculitis or significant stenosis. Renals: Both  renal arteries are patent without evidence of aneurysm, dissection, vasculitis, fibromuscular dysplasia or significant stenosis. IMA: Patent without evidence of aneurysm, dissection, vasculitis or significant stenosis. Inflow: Patent without evidence of aneurysm, dissection, vasculitis or significant stenosis. Veins: No obvious venous abnormality within the limitations of this arterial phase study. Review of the MIP images confirms the above findings. NON-VASCULAR Hepatobiliary: No focal liver abnormality is seen. No gallstones, gallbladder wall thickening, or biliary dilatation. Pancreas: Unremarkable. No pancreatic ductal dilatation or surrounding inflammatory changes. Spleen: Normal in size without focal abnormality. Adrenals/Urinary Tract: Adrenal glands appear normal. Right renal cyst is noted. No hydronephrosis or renal obstruction is noted. No renal or ureteral calculi are noted. Urinary bladder is unremarkable. Stomach/Bowel: Stomach is within normal limits. Appendix appears normal. No evidence of bowel wall thickening, distention, or inflammatory changes. Sigmoid diverticulosis is noted without inflammation. Lymphatic: No adenopathy is noted. Reproductive: Prostate is unremarkable. Other: No abdominal wall hernia or abnormality. No abdominopelvic ascites. Musculoskeletal: No acute or significant osseous findings. Review of the MIP images confirms the above findings. IMPRESSION: 1. No evidence of thoracic or abdominal aortic dissection. 2. 4 cm ascending thoracic aortic aneurysm is noted. Recommend annual imaging followup by CTA or MRA. This recommendation follows 2010 ACCF/AHA/AATS/ACR/ASA/SCA/SCAI/SIR/STS/SVM Guidelines for the Diagnosis and Management of  Patients with Thoracic Aortic Disease. Circulation. 2010; 121: Z610-R604. Aortic aneurysm NOS (ICD10-I71.9). 3. Status post aortic valve repair. 4. Small bilateral pleural effusions are noted with adjacent bibasilar subsegmental atelectasis or infiltrates. 5. Sigmoid diverticulosis without inflammation. Electronically Signed   By: Lupita Raider M.D.   On: 01/22/2021 15:08    Procedures Procedures   Medications Ordered in ED Medications  acetaminophen (TYLENOL) tablet 1,000 mg (1,000 mg Oral Given 01/22/21 1804)  allopurinol (ZYLOPRIM) tablet 300 mg (has no administration in time range)  aspirin EC tablet 325 mg (has no administration in time range)  carvedilol (COREG) tablet 6.25 mg (has no administration in time range)  rosuvastatin (CRESTOR) tablet 10 mg (has no administration in time range)  ferrous sulfate tablet 325 mg (has no administration in time range)  cefTRIAXone (ROCEPHIN) 2 g in sodium chloride 0.9 % 100 mL IVPB (0 g Intravenous Stopped 01/22/21 1846)  azithromycin (ZITHROMAX) 500 mg in sodium chloride 0.9 % 250 mL IVPB (500 mg Intravenous New Bag/Given 01/22/21 1903)  enoxaparin (LOVENOX) injection 40 mg (40 mg Subcutaneous Not Given 01/22/21 1903)  0.9 %  sodium chloride infusion ( Intravenous New Bag/Given 01/22/21 1810)  polyethylene glycol (MIRALAX / GLYCOLAX) packet 17 g (has no administration in time range)  methocarbamol (ROBAXIN) 500 mg in dextrose 5 % 50 mL IVPB (has no administration in time range)  morphine 4 MG/ML injection 4 mg (4 mg Intravenous Given 01/22/21 1230)  cefTRIAXone (ROCEPHIN) 1 g in sodium chloride 0.9 % 100 mL IVPB (0 g Intravenous Stopped 01/22/21 1319)  azithromycin (ZITHROMAX) 500 mg in sodium chloride 0.9 % 250 mL IVPB (0 mg Intravenous Stopped 01/22/21 1424)  morphine 4 MG/ML injection 4 mg (4 mg Intravenous Given 01/22/21 1411)  sodium chloride 0.9 % bolus 500 mL (0 mLs Intravenous Stopped 01/22/21 1611)  iohexol (OMNIPAQUE) 350 MG/ML injection 60 mL (60 mLs  Intravenous Contrast Given 01/22/21 1436)    ED Course  I have reviewed the triage vital signs and the nursing notes.  Pertinent labs & imaging results that were available during my care of the patient were reviewed by me and considered in my medical decision making (see chart for details).  Clinical Course  as of 01/22/21 1902  Fri Jan 22, 2021  1118 Reviewed EKG with Dr. Excell Seltzerooper from cardiology.  He does not feel this meet STEMI activation currently but does recommend work-up. [MB]  1120 Chest x-ray showing possible left lower lobe infiltrate.  Awaiting radiology reading. [MB]  1313 Patient states his symptoms are improved after the pain medicine.  Antibiotics are infusing.  Initial troponin not markedly elevated.  Waiting on delta troponin and lactate. [MB]  1415 Discussed with radiologist regarding CT protocol for dissection versus PE.  He thought the dissection study would be the most useful.  Patient has CKD so we will get reduced dye load. [MB]  1539 Reviewed results with Dr. Luberta Robertsonhatterjee [facility] hospital[facility] who will evaluate the patient for admission. [MB]    Clinical Course User Index [MB] Terrilee FilesButler, Sybrina Laning C, MD   MDM Rules/Calculators/A&P                         This patient complains of upper back pain chest pain shortness of breath; this involves an extensive number of treatment Options and is a complaint that carries with it a high risk of complications and Morbidity. The differential includes pneumonia, PE, musculoskeletal, dissection, ACS  I ordered, reviewed and interpreted labs, which included CBC with elevated white count, stable hemoglobin, chemistries normal other than elevated creatinine similar to priors, troponins mildly elevated but not rising, BNP mildly elevated, lactic acid not elevated, Covid testing negative I ordered medication IV pain medicine IV antibiotics I ordered imaging studies which included chest x-ray and CT chest abdomen dissection protocol and I  independently    visualized and interpreted imaging which showed bilateral infiltrates, ascending aortic aneurysm, no dissection Additional history obtained from patient's wife Previous records obtained and reviewed in epic including recent operative aortic valve replacement I consulted Dr. Excell Seltzerooper cardiology and Dr. Luberta Robertsonhatterjee [facility] hospital[facility] and discussed lab and imaging findings  Critical Interventions: None  After the interventions stated above, I reevaluated the patient and found patient's pain to be improved.  He is agreeable to admission for further management of his symptoms.   Final Clinical Impression(s) / ED Diagnoses Final diagnoses:  Nonspecific chest pain  Pneumonia of both lungs due to infectious organism, unspecified part of lung  Chronic kidney disease, unspecified CKD stage    Rx / DC Orders ED Discharge Orders    None       Terrilee FilesButler, Lillian Tigges C, MD 01/22/21 1909

## 2021-01-22 NOTE — ED Notes (Signed)
Got pt undressed and placed on monitor

## 2021-01-22 NOTE — H&P (Signed)
History and Physical:    Eric [name]   VOJ:[id number] DOB: 23-Sep-1956 DOA: 01/22/2021  Referring MD/provider: Dr. Charm Barges PCP: Elias Else, MD   Patient coming from: Home  Chief Complaint: Pain in neck bilateral shoulders x1 week with intermittent shortness of breath for 3 to 4 days.  History of Present Illness:   Eric [name] is an 65 y.o. male with PMH significant for HTN, CKD status post AVR last month who was in his usual state of health until last week when he developed bilateral shoulder pain and shortness of breath.  Patient states that he drove to [facility] and slept in an unfamiliar bed with unfamiliar pillows for 2 nights.  He had some neck stiffness thereafter which he attributed to not sleeping in his bed.  He came home and 5 days ago sat in a massage chair after which she developed acute pain in his neck and bilateral shoulders.  Pain was so severe that he was having difficulty taking a deep breath and because every time he took a deep breath his shoulders would hurt.  Patient noted that he was unable to lie flat due to pain in his shoulders and every time his shoulders were touched they were very painful.  Patient decides neck/shoulder pain as constant, worsened with movement and with any touch.  He does not believe the pain is in his chest. Patient called his PCP and cardiologist and was treated with ibuprofen and Tylenol without any good effect.  Today patient came in because although shoulder pain is a little bit better he is now short of breath walking across the room.  He had initially attributed his shortness of breath to shoulder pain and inability to take a deep breath however now feels like he is short of breath not necessarily associated with shoulder pain.  Patient denies malaise, fevers or chills.  He denies any cough.  No hemoptysis.  Patient's wife states he was walking around the room yesterday with no evidence of shortness of breath.  She does not feel  that he has been sick appearing.  His biggest problem has been his pain in his neck.  Patient specifically denies any chest pain or palpitations.  No chest pressure.  ED Course:  The patient was noted to be modestly hypertensive, tachycardic and have a low-grade fever to 100.5.  She did not have an oxygen requirement with O2 saturations 95-100 on room air.  Laboratory data were notable for a creatinine of 1.7 which is stable and new leukocytosis of 13.6 up from 9.1 last month.  Chest x-ray showed bilateral lower lobe opacities consistent with pneumonia.  CT angiogram ruled out dissection of thoracic or abdominal aorta.  He does have a 2.4 cm thoracic aneurysm which appears to be stable.  He also has bilateral small pleural effusions and subsegmental atelectasis versus infiltrates bilateral lower lobes.  ROS:   ROS   Review of Systems: General: Denies fever, chills, malaise,  Respiratory: Denies cough,  hemoptysis Cardiovascular: Denies chest pain or palpitations GI: Denies nausea, vomiting, diarrhea or constipation GU: Denies dysuria, frequency or hematuria   Past Medical History:   Past Medical History:  Diagnosis Date   Allergy    ALLERGIC RHINITIS..WORSE SPRING/FALL   Aortic valve regurgitation 07/10/14   ECHO @ [facility]   Arthritis    Chronic kidney disease    Stage 3 , followed by [facility]   GERD (gastroesophageal reflux disease)    CURRENTLY DIET CONTROLLED  Heart murmur    Hypertension    Pneumonia 06/2019   Sinusitis, chronic     Past Surgical History:   Past Surgical History:  Procedure Laterality Date   AORTIC VALVE REPLACEMENT N/A 11/23/2020   Procedure: AORTIC VALVE REPLACEMENT (AVR) USING EDWARDS INSPIRIS RESILIA 25 MM AORTIC VALVE;  Surgeon: Alleen BorneBartle, Bryan K, MD;  Location: [facility] OR;  Service: Open Heart Surgery;  Laterality: N/A;   COLONOSCOPY     KNEE ARTHROSCOPY Bilateral    LASIK     TEE WITHOUT CARDIOVERSION N/A 11/23/2020    Procedure: TRANSESOPHAGEAL ECHOCARDIOGRAM (TEE);  Surgeon: Alleen BorneBartle, Bryan K, MD;  [facility];  Service: Open Heart Surgery;  Laterality: N/A;    Social History:   Social History   Socioeconomic History   Marital status: Married    Spouse name: Eric CoasterGina   Number of children: 3   Years of education: 18   Highest education level: Master's degree (e.g., MA, MS, MEng, MEd, MSW, MBA)  Occupational History   Occupation: Art gallery managerengineer  Tobacco Use   Smoking status: Never Smoker   Smokeless tobacco: Never Used  Building services engineerVaping Use   Vaping Use: Never used  Substance and Sexual Activity   Alcohol use: Yes    Alcohol/week: 2.0 standard drinks    Types: 2 Standard drinks or equivalent per week    Comment: occasional   Drug use: No   Sexual activity: Yes  Other Topics Concern   Not on file  Social History Narrative   Not on file   Social Determinants of Health   Financial Resource Strain: Not on file  Food Insecurity: Not on file  Transportation Needs: Not on file  Physical Activity: Not on file  Stress: Not on file  Social Connections: Not on file  Intimate Partner Violence: Not on file    Allergies   Amlodipine  Family history:   Family History  Problem Relation Age of Onset   Hypertension Mother    Cancer Father        STOMACH   Diabetes Daughter    Cancer Maternal Grandmother        STOMACH   Heart disease Maternal Grandmother    Cancer Brother        COLON   Diabetes Brother     Current Medications:   Prior to Admission medications   Medication Sig Start Date End Date Taking? Authorizing Provider  acetaminophen (TYLENOL) 500 MG tablet Take 1,000 mg by mouth every 6 (six) hours as needed (pain).   Yes [provider]  allopurinol (ZYLOPRIM) 300 MG tablet Take 300 mg by mouth daily. 09/21/18  Yes [provider]  aspirin EC 325 MG EC tablet Take 1 tablet (325 mg total) by mouth daily. 11/28/20  Yes Gold, Wayne E, PA-C  carvedilol  (COREG) 6.25 MG tablet Take 1 tablet (6.25 mg total) by mouth 2 (two) times daily. 12/16/20  Yes Rosalio MacadamiaGerhardt, Lori C, NP  ferrous sulfate 325 (65 FE) MG tablet Take 325 mg by mouth daily.   Yes [provider]  ibuprofen (ADVIL) 200 MG tablet Take 200 mg by mouth 2 (two) times daily as needed (pain).   Yes [provider]  Menthol, Topical Analgesic, (ICY HOT EX) Apply 1 application topically daily as needed (pain).   Yes [provider]  rosuvastatin (CRESTOR) 10 MG tablet Take 1 tablet (10 mg total) by mouth daily. 05/18/20  Yes Lyn RecordsSmith, Henry W, MD    Physical Exam:   Vitals:  01/22/21 1400 01/22/21 1612 01/22/21 1613 01/22/21 1614  BP: (!) 161/95   (!) 152/101  Pulse: (!) 110 (!) 102  100  Resp: (!) 26 (!) 23  (!) 26  Temp:   100 F (37.8 C)   TempSrc:   Oral   SpO2: 94%   97%  Weight:      Height:         Physical Exam: Blood pressure (!) 152/101, pulse 100, temperature 100 F (37.8 C), temperature source Oral, resp. rate (!) 26, height  (1.778 m), weight 96.2 kg, SpO2 97 %. Gen: Slightly diaphoretic tired appearing man sitting up in stretcher in no acute distress having just received morphine.  Attentive wife is at bedside. Eyes: sclera anicteric, conjuctiva mildly injected bilaterally CVS: S1-S2, regulary, 2/6 systolic murmur upper sternal borders bilaterally.  No gallops Respiratory: Patient has coarse sounding rales at his bases one third of the way up bilaterally. GI: NABS, soft, NT  LE: No edema. No cyanosis Neuro: A/O x 3, Moving all extremities equally with normal strength, CN 3-12 intact, grossly nonfocal.  Psych: patient is logical and coherent, judgement and insight appear normal, mood and affect appropriate to situation.   Data Review:    Labs: Basic Metabolic Panel: Recent Labs  Lab 01/22/21 1059  NA 136  K 3.8  CL 103  CO2 23  GLUCOSE 143*  BUN 20  CREATININE 1.71*  CALCIUM 9.1   Liver Function Tests: No results for  input(s): AST, ALT, ALKPHOS, BILITOT, PROT, ALBUMIN in the last 168 hours. No results for input(s): LIPASE, AMYLASE in the last 168 hours. No results for input(s): AMMONIA in the last 168 hours. CBC: Recent Labs  Lab 01/22/21 1059  WBC 13.6*  HGB 11.5*  HCT 33.0*  MCV 70.8*  PLT 264   Cardiac Enzymes: No results for input(s): CKTOTAL, CKMB, CKMBINDEX, TROPONINI in the last 168 hours.  BNP (last 3 results) No results for input(s): PROBNP in the last 8760 hours. CBG: No results for input(s): GLUCAP in the last 168 hours.  Urinalysis    Component Value Date/Time   COLORURINE YELLOW 11/19/2020 1446   APPEARANCEUR CLEAR 11/19/2020 1446   LABSPEC 1.014 11/19/2020 1446   PHURINE 6.0 11/19/2020 1446   GLUCOSEU NEGATIVE 11/19/2020 1446   HGBUR NEGATIVE 11/19/2020 1446   BILIRUBINUR NEGATIVE 11/19/2020 1446   KETONESUR NEGATIVE 11/19/2020 1446   PROTEINUR NEGATIVE 11/19/2020 1446   NITRITE NEGATIVE 11/19/2020 1446   LEUKOCYTESUR NEGATIVE 11/19/2020 1446      Radiographic Studies: DG Chest 2 View  Result Date: 01/22/2021 CLINICAL DATA:  Chest pain EXAM: CHEST - 2 VIEW COMPARISON:  12/23/2020 FINDINGS: . airspace opacities in both lung bases, left greater than right concerning for pneumonia. Small left effusion suspected. Heart is normal size. No acute bony abnormality. IMPRESSION: Bilateral lower lobe airspace opacities, left greater than right concerning for pneumonia. Small left effusion. Electronically Signed   By: Charlett Nose M.D.   On: 01/22/2021 11:37   CT Angio Chest/Abd/Pel for Dissection W and/or W/WO  Result Date: 01/22/2021 CLINICAL DATA:  Chest pain. EXAM: CT ANGIOGRAPHY CHEST, ABDOMEN AND PELVIS TECHNIQUE: Non-contrast CT of the chest was initially obtained. Multidetector CT imaging through the chest, abdomen and pelvis was performed using the standard protocol during bolus administration of intravenous contrast. Multiplanar reconstructed images and MIPs were obtained  and reviewed to evaluate the vascular anatomy. CONTRAST:  60mL OMNIPAQUE IOHEXOL 350 MG/ML SOLN COMPARISON:  October 23, 2020. FINDINGS: CTA CHEST FINDINGS  Cardiovascular: No evidence of thoracic aortic dissection. Status post aortic valve repair. 4 cm ascending thoracic aortic aneurysm is noted. Great vessels are widely patent without stenosis. Mild cardiomegaly is noted. No pericardial effusion is noted. Mediastinum/Nodes: No enlarged mediastinal, hilar, or axillary lymph nodes. Thyroid gland, trachea, and esophagus demonstrate no significant findings. Lungs/Pleura: No pneumothorax is noted. Small bilateral pleural effusions are noted with adjacent bibasilar subsegmental atelectasis or infiltrates. Musculoskeletal: No chest wall abnormality. No acute or significant osseous findings. Review of the MIP images confirms the above findings. CTA ABDOMEN AND PELVIS FINDINGS VASCULAR Aorta: Normal caliber aorta without aneurysm, dissection, vasculitis or significant stenosis. Celiac: Patent without evidence of aneurysm, dissection, vasculitis or significant stenosis. SMA: Patent without evidence of aneurysm, dissection, vasculitis or significant stenosis. Renals: Both renal arteries are patent without evidence of aneurysm, dissection, vasculitis, fibromuscular dysplasia or significant stenosis. IMA: Patent without evidence of aneurysm, dissection, vasculitis or significant stenosis. Inflow: Patent without evidence of aneurysm, dissection, vasculitis or significant stenosis. Veins: No obvious venous abnormality within the limitations of this arterial phase study. Review of the MIP images confirms the above findings. NON-VASCULAR Hepatobiliary: No focal liver abnormality is seen. No gallstones, gallbladder wall thickening, or biliary dilatation. Pancreas: Unremarkable. No pancreatic ductal dilatation or surrounding inflammatory changes. Spleen: Normal in size without focal abnormality. Adrenals/Urinary Tract: Adrenal glands  appear normal. Right renal cyst is noted. No hydronephrosis or renal obstruction is noted. No renal or ureteral calculi are noted. Urinary bladder is unremarkable. Stomach/Bowel: Stomach is within normal limits. Appendix appears normal. No evidence of bowel wall thickening, distention, or inflammatory changes. Sigmoid diverticulosis is noted without inflammation. Lymphatic: No adenopathy is noted. Reproductive: Prostate is unremarkable. Other: No abdominal wall hernia or abnormality. No abdominopelvic ascites. Musculoskeletal: No acute or significant osseous findings. Review of the MIP images confirms the above findings. IMPRESSION: 1. No evidence of thoracic or abdominal aortic dissection. 2. 4 cm ascending thoracic aortic aneurysm is noted. Recommend annual imaging followup by CTA or MRA. This recommendation follows 2010 ACCF/AHA/AATS/ACR/ASA/SCA/SCAI/SIR/STS/SVM Guidelines for the Diagnosis and Management of Patients with Thoracic Aortic Disease. Circulation. 2010; 121: W295-A213. Aortic aneurysm NOS (ICD10-I71.9). 3. Status post aortic valve repair. 4. Small bilateral pleural effusions are noted with adjacent bibasilar subsegmental atelectasis or infiltrates. 5. Sigmoid diverticulosis without inflammation. Electronically Signed   By: Lupita Raider M.D.   On: 01/22/2021 15:08    EKG: Independently reviewed.  Sinus rhythm at 100.  Normal axis.  First-degree heart block.  LVH by voltage with T wave inversions 2, 3, F, V4 through V6.  T wave inversions inferiorly are new compared to EKG from last month.  Assessment/Plan:   Principal Problem:   CAP (community acquired pneumonia) Active Problems:   Essential hypertension   CKD (chronic kidney disease), stage III (HCC)   S/P AVR (aortic valve replacement)  65 year old man who is 1 month status post AVR presents with bilateral neck pain and shortness of breath.  Work-up is notable for bilateral lower lobe opacities.  Luckily CT angiogram is negative  for any evidence of aortic dissection.  CAP Patient has low-grade fever, mild leukocytosis and dyspnea on exertion although no clear oxygen requirement.  Opacifications and chest x-ray may be subsegmental atelectasis/pleural effusion and/or infiltration.  I suspect patient does have community-acquired pneumonia and will start him on ceftriaxone and azithromycin.  Oxygen as needed.  Bilateral neck pain I suspect patient has spasm of his trapezius bilaterally most likely brought on by awkward sleeping position and then using  massage chair. Ibuprofen and Tylenol have not been effective. We will place patient on Flexeril and advise alternating heat and cold  Abnormal EKG Patient with concerning T wave inversions inferiorly and laterally ED physician discussed this with cardiology who felt this was likely not ischemic Patient is without any chest pain Troponins are not elevated at 18 and 19 Consultation to cardiology can be done if warranted Repeat EKG requested for the morning.   CKD Patient received a significant dye load with his CT angiogram, will hydrate patient gently and follow creatinine closely.  HTN  Continue carvedilol per home doses  Status post AVR As noted above, dissection has been ruled out via CT angiogram Further follow-up as warranted already scheduled as an outpatient Continue carvedilol, aspirin 325 and rosuvastatin per home doses    Other information:   DVT prophylaxis: Lovenox ordered. Code Status: Full Family Communication: Patient's wife was at bedside throughout Disposition Plan: Home Consults called: None Admission status: Inpatient  Eric [name] [facility]  If 7PM-7AM, please contact night-coverage [email or web address] Password [facility] 01/22/2021, 4:46 PM

## 2021-01-22 NOTE — ED Notes (Signed)
3 unsuccessful IV attempts. Co worker will attempt IV.

## 2021-01-22 NOTE — ED Triage Notes (Signed)
Pt here via pov with reports of neck pain, shoulder pain and chest pain since Monday. Pt now with difficulty breathing due to pain. Pt with open heart surgery in January.

## 2021-01-22 NOTE — ED Triage Notes (Signed)
Pt reports chest pain, right  scapular pain and abdominal pain and shortness of breath x 3-4 days.

## 2021-01-22 NOTE — ED Notes (Signed)
BC drawn by Gigi Gin, RN

## 2021-01-23 DIAGNOSIS — J189 Pneumonia, unspecified organism: Secondary | ICD-10-CM

## 2021-01-23 LAB — IRON AND TIBC
Iron: 13 ug/dL — ABNORMAL LOW (ref 45–182)
Saturation Ratios: 6 % — ABNORMAL LOW (ref 17.9–39.5)
TIBC: 227 ug/dL — ABNORMAL LOW (ref 250–450)
UIBC: 214 ug/dL

## 2021-01-23 LAB — MAGNESIUM: Magnesium: 1.6 mg/dL — ABNORMAL LOW (ref 1.7–2.4)

## 2021-01-23 LAB — PHOSPHORUS: Phosphorus: 2.5 mg/dL (ref 2.5–4.6)

## 2021-01-23 LAB — CBC
HCT: 26.9 % — ABNORMAL LOW (ref 39.0–52.0)
HCT: 24.9 % — ABNORMAL LOW (ref 39.0–52.0)
Hemoglobin: 9.5 g/dL — ABNORMAL LOW (ref 13.0–17.0)
Hemoglobin: 8.7 g/dL — ABNORMAL LOW (ref 13.0–17.0)
MCH: 24.5 pg — ABNORMAL LOW (ref 26.0–34.0)
MCH: 24.7 pg — ABNORMAL LOW (ref 26.0–34.0)
MCHC: 35.3 g/dL (ref 30.0–36.0)
MCHC: 34.9 g/dL (ref 30.0–36.0)
MCV: 69.3 fL — ABNORMAL LOW (ref 80.0–100.0)
MCV: 70.7 fL — ABNORMAL LOW (ref 80.0–100.0)
Platelets: 238 10*3/uL (ref 150–400)
Platelets: 236 10*3/uL (ref 150–400)
RBC: 3.88 MIL/uL — ABNORMAL LOW (ref 4.22–5.81)
RBC: 3.52 MIL/uL — ABNORMAL LOW (ref 4.22–5.81)
RDW: 14.8 % (ref 11.5–15.5)
RDW: 14.7 % (ref 11.5–15.5)
WBC: 9.9 10*3/uL (ref 4.0–10.5)
WBC: 7.3 10*3/uL (ref 4.0–10.5)
nRBC: 0 % (ref 0.0–0.2)
nRBC: 0 % (ref 0.0–0.2)

## 2021-01-23 LAB — BASIC METABOLIC PANEL
Anion gap: 9 (ref 5–15)
Anion gap: 9 (ref 5–15)
BUN: 15 mg/dL (ref 8–23)
BUN: 13 mg/dL (ref 8–23)
CO2: 21 mmol/L — ABNORMAL LOW (ref 22–32)
CO2: 20 mmol/L — ABNORMAL LOW (ref 22–32)
Calcium: 8.5 mg/dL — ABNORMAL LOW (ref 8.9–10.3)
Calcium: 7 mg/dL — ABNORMAL LOW (ref 8.9–10.3)
Chloride: 106 mmol/L (ref 98–111)
Chloride: 113 mmol/L — ABNORMAL HIGH (ref 98–111)
Creatinine, Ser: 1.48 mg/dL — ABNORMAL HIGH (ref 0.61–1.24)
Creatinine, Ser: 1.28 mg/dL — ABNORMAL HIGH (ref 0.61–1.24)
GFR, Estimated: 53 mL/min — ABNORMAL LOW (ref >60)
GFR, Estimated: >60 mL/min (ref >60)
Glucose, Bld: 107 mg/dL — ABNORMAL HIGH (ref 70–99)
Glucose, Bld: 82 mg/dL (ref 70–99)
Potassium: 3.7 mmol/L (ref 3.5–5.1)
Potassium: 3.4 mmol/L — ABNORMAL LOW (ref 3.5–5.1)
Sodium: 136 mmol/L (ref 135–145)
Sodium: 142 mmol/L (ref 135–145)

## 2021-01-23 LAB — HIV ANTIBODY (ROUTINE TESTING W REFLEX): HIV Screen 4th Generation wRfx: NONREACTIVE

## 2021-01-23 LAB — FERRITIN: Ferritin: 238 ng/mL (ref 24–336)

## 2021-01-23 LAB — FOLATE: Folate: 15.5 ng/mL (ref >5.9)

## 2021-01-23 LAB — VITAMIN B12: Vitamin B-12: 729 pg/mL (ref 180–914)

## 2021-01-23 MED ORDER — SODIUM CHLORIDE 0.9 % IV SOLN: INTRAVENOUS | Status: DC

## 2021-01-23 MED ORDER — METHOCARBAMOL 500 MG PO TABS: 500.0000 mg | ORAL_TABLET | Freq: Four times a day (QID) | ORAL | Status: DC | PRN

## 2021-01-23 NOTE — Progress Notes (Signed)
Patient ambulated the hall x3 with no shortness of breath or complaints of discomfort. Patient stated that he felt better after ambulating.   RN will continue to monitor.

## 2021-01-23 NOTE — Progress Notes (Addendum)
PROGRESS NOTE  Eric [name] IHK:[id number] DOB: [birth year] DOA: 01/22/2021 PCP: Elias Else, MD  HPI/Recap of past 24 hours: Eric [name] is an 65 y.o. male with PMH significant for HTN, CKD status post AVR last month who was in his usual state of health until last week when he developed bilateral shoulder pain and shortness of breath.  Patient states that he drove to [facility] and slept in an unfamiliar bed with unfamiliar pillows for 2 nights.  He had some neck stiffness thereafter which he attributed to not sleeping in his bed.  He came home and 5 days ago sat in a massage chair after which she developed acute pain in his neck and bilateral shoulders.  Pain was so severe that he was having difficulty taking a deep breath and because every time he took a deep breath his shoulders would hurt.  Patient noted that he was unable to lie flat due to pain in his shoulders and every time his shoulders were touched they were very painful.  Patient decides neck/shoulder pain as constant, worsened with movement and with any touch.  He does not believe the pain is in his chest. Patient called his PCP and cardiologist and was treated with ibuprofen and Tylenol without any good effect.  Today patient came in because although shoulder pain is a little bit better he is now short of breath walking across the room.  He had initially attributed his shortness of breath to shoulder pain and inability to take a deep breath however now feels like he is short of breath not necessarily associated with shoulder pain.  Patient denies malaise, fevers or chills.  He denies any cough.  No hemoptysis.  Patient's wife states he was walking around the room yesterday with no evidence of shortness of breath.  She does not feel that he has been sick appearing.  His biggest problem has been his pain in his neck.  Patient specifically denies any chest pain or palpitations.  No chest pressure.  ED Course:  The patient was  noted to be modestly hypertensive, tachycardic and have a low-grade fever to 100.5.  She did not have an oxygen requirement with O2 saturations 95-100 on room air.  Laboratory data were notable for a creatinine of 1.7 which is stable and new leukocytosis of 13.6 up from 9.1 last month.  Chest x-ray showed bilateral lower lobe opacities consistent with pneumonia.  CT angiogram ruled out dissection of thoracic or abdominal aorta.  He does have a 2.4 cm thoracic aneurysm which appears to be stable.  He also has bilateral small pleural effusions and subsegmental atelectasis versus infiltrates bilateral lower lobes.  01/23/21: Patient was seen and examined at his bedside.  At the time of this exam he denies any back pain while laying in bed at rest.  He is currently on Robaxin.  States his breathing is improved.  He is on broad-spectrum antibiotics Rocephin and azithromycin for community-acquired pneumonia.    Patient has been informed of ascending thoracic aortic aneurysm of 4 cm.  Advised to have follow-up and repeat imaging annually.  Patient understands and agrees with plan.  Assessment/Plan: Principal Problem:   CAP (community acquired pneumonia) Active Problems:   Essential hypertension   CKD (chronic kidney disease), stage III (HCC)   S/P AVR (aortic valve replacement)   Sepsis secondary to community-acquired pneumonia, POA. Presented with leukocytosis, fever with T-max of 100.5, tachypnea and evidence of pneumonia on chest x-ray and CT scan chest..  Started on Rocephin and azithromycin, continue. Obtain sputum culture as able. Leukocytosis has resolved, currently afebrile. Obtain a procalcitonin level in the morning. Likely will switch to oral antibiotics tomorrow.  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 Incidental findings on CTA chest Patient informed of this finding Recommended annually imaging, patient understands and agrees with plan.  Back pain, suspect 2/2 muscle  spasms Responded well to IV Robaxin Switch to po Robaxin PRN for dc planning  Elevated troponin, likely demand ischemia I the setting of tachycardia, sepsis Troponin peaked at 19 and trended down No evidence of acute ischemia on 12 lead EKG Denies anginal symptoms.  Chronic microcytic anemia/iron deficiency anemia Baseline hemoglobin appears to be 11 This morning drop in hemoglobin 9.5 with MCV of 69 Continue home iron supplement. Obtain iron studies  CKD 3A He appears to be at his baseline creatinine 1.4 with GFR 53 Continue to monitor renal function Continue IV fluid Monitor urine output Avoid nephrotoxic agents Repeat BMP this afternoon  Gout Continue home allopurinol.  Hyperlipidemia Continue home Crestor.   Code Status: Full code.  Family Communication: None at bedside.  Disposition Plan: Likely will discharge to home on 01/24/2021   Consultants:  None  Procedures:  None.  Antimicrobials:  Rocephin  Azithromycin  DVT prophylaxis: Subcu Lovenox daily  Status is: Inpatient    Dispo:  Patient From: Home  Planned Disposition: Home  Anticipated date of discharge: 01/24/2021.  Medically stable for discharge: Yes, ongoing management of sepsis secondary to community-acquired pneumonia.          Objective: Vitals:   01/22/21 1943 01/23/21 0003 01/23/21 0403 01/23/21 0726  BP: (!) 131/92 120/77 120/88 125/80  Pulse: 99 89 86 90  Resp: (!) 25 (!) 22 20 20   Temp: 98.7 F (37.1 C) 98.4 F (36.9 C) 99.6 F (37.6 C) 99.6 F (37.6 C)  TempSrc: Oral Oral Oral Oral  SpO2: 95% 94% 97% 92%  Weight: 99.7 kg  100.1 kg   Height: 5\' 10"  (1.778 m)       Intake/Output Summary (Last 24 hours) at 01/23/2021 1723 Last data filed at 01/23/2021 1638 Gross per 24 hour  Intake 1603.72 ml  Output 1255 ml  Net 348.72 ml   Filed Weights   01/22/21 1053 01/22/21 1943 01/23/21 0403  Weight: 96.2 kg 99.7 kg 100.1 kg    Exam:  . General: 65 y.o. year-old male  well developed well nourished in no acute distress.  Alert and oriented x3. . Cardiovascular: Regular rate and rhythm with no rubs or gallops.  No thyromegaly or JVD noted.   Marland Kitchen. Respiratory: Mild rales at bases with no wheezing noted.  Good inspiratory effort. . Abdomen: Soft nontender nondistended with normal bowel sounds x4 quadrants. . Musculoskeletal: No lower extremity edema bilaterally. . Skin: No ulcerative lesions noted or rashes . Psychiatry: Mood is appropriate for condition and setting   Data Reviewed: CBC: Recent Labs  Lab 01/22/21 1059 01/23/21 0427  WBC 13.6* 9.9  HGB 11.5* 9.5*  HCT 33.0* 26.9*  MCV 70.8* 69.3*  PLT 264 238   Basic Metabolic Panel: Recent Labs  Lab 01/22/21 1059 01/23/21 0427  NA 136 136  K 3.8 3.7  CL 103 106  CO2 23 21*  GLUCOSE 143* 107*  BUN 20 15  CREATININE 1.71* 1.48*  CALCIUM 9.1 8.5*   GFR: Estimated Creatinine Clearance: 60.6 mL/min (A) (by C-G formula based on SCr of 1.48 mg/dL (H)). Liver Function Tests: No results for input(s): AST,  ALT, ALKPHOS, BILITOT, PROT, ALBUMIN in the last 168 hours. No results for input(s): LIPASE, AMYLASE in the last 168 hours. No results for input(s): AMMONIA in the last 168 hours. Coagulation Profile: No results for input(s): INR, PROTIME in the last 168 hours. Cardiac Enzymes: No results for input(s): CKTOTAL, CKMB, CKMBINDEX, TROPONINI in the last 168 hours. BNP (last 3 results) No results for input(s): PROBNP in the last 8760 hours. HbA1C: No results for input(s): HGBA1C in the last 72 hours. CBG: No results for input(s): GLUCAP in the last 168 hours. Lipid Profile: No results for input(s): CHOL, HDL, LDLCALC, TRIG, CHOLHDL, LDLDIRECT in the last 72 hours. Thyroid Function Tests: No results for input(s): TSH, T4TOTAL, FREET4, T3FREE, THYROIDAB in the last 72 hours. Anemia Panel: No results for input(s): VITAMINB12, FOLATE, FERRITIN, TIBC, IRON, RETICCTPCT in the last 72 hours. Urine  analysis:    Component Value Date/Time   COLORURINE YELLOW 11/19/2020 1446   APPEARANCEUR CLEAR 11/19/2020 1446   LABSPEC 1.014 11/19/2020 1446   PHURINE 6.0 11/19/2020 1446   GLUCOSEU NEGATIVE 11/19/2020 1446   HGBUR NEGATIVE 11/19/2020 1446   BILIRUBINUR NEGATIVE 11/19/2020 1446   KETONESUR NEGATIVE 11/19/2020 1446   PROTEINUR NEGATIVE 11/19/2020 1446   NITRITE NEGATIVE 11/19/2020 1446   LEUKOCYTESUR NEGATIVE 11/19/2020 1446   Sepsis Labs: @LABRCNTIP (procalcitonin:4,lacticidven:4)  ) Recent Results (from the past 240 hour(s))  Culture, blood (routine x 2)     Status: None (Preliminary result)   Collection Time: 01/22/21 11:21 AM   Specimen: BLOOD  Result Value Ref Range Status   Specimen Description BLOOD RIGHT ANTECUBITAL  Final   Special Requests   Final    BOTTLES DRAWN AEROBIC AND ANAEROBIC Blood Culture adequate volume   Culture   Final    NO GROWTH < 24 HOURS Performed at [facility], [address], [place], [place]    Report Status PENDING  Incomplete  Resp Panel by RT-PCR (Flu A&B, Covid) Nasopharyngeal Swab     Status: None   Collection Time: 01/22/21 11:27 AM   Specimen: Nasopharyngeal Swab; Nasopharyngeal(NP) swabs in vial transport medium  Result Value Ref Range Status   SARS Coronavirus 2 by RT PCR NEGATIVE NEGATIVE Final    Comment: (NOTE) SARS-CoV-2 target nucleic acids are NOT DETECTED.  The SARS-CoV-2 RNA is generally detectable in upper respiratory specimens during the acute phase of infection. The lowest concentration of SARS-CoV-2 viral copies this assay can detect is 138 copies/mL. A negative result does not preclude SARS-Cov-2 infection and should not be used as the sole basis for treatment or other patient management decisions. A negative result may occur with  improper specimen collection/handling, submission of specimen other than nasopharyngeal swab, presence of viral mutation(s) within the areas targeted by this assay, and  inadequate number of viral copies(<138 copies/mL). A negative result must be combined with clinical observations, patient history, and epidemiological information. The expected result is Negative.  Fact Sheet for Patients:  03/24/21  Fact Sheet for Healthcare Providers:  BloggerCourse.com  This test is no t yet approved or cleared by the [employer] FDA and  has been authorized for detection and/or diagnosis of SARS-CoV-2 by FDA under an Emergency Use Authorization (EUA). This EUA will remain  in effect (meaning this test can be used) for the duration of the COVID-19 declaration under Section 564(b)(1) of the Act, 21 U.S.C.section 360bbb-3(b)(1), unless the authorization is terminated  or revoked sooner.       Influenza A by PCR NEGATIVE  NEGATIVE Final   Influenza B by PCR NEGATIVE NEGATIVE Final    Comment: (NOTE) The Xpert Xpress SARS-CoV-2/FLU/RSV plus assay is intended as an aid in the diagnosis of influenza from Nasopharyngeal swab specimens and should not be used as a sole basis for treatment. Nasal washings and aspirates are unacceptable for Xpert Xpress SARS-CoV-2/FLU/RSV testing.  Fact Sheet for Patients: BloggerCourse.com  Fact Sheet for Healthcare Providers: SeriousBroker.it  This test is not yet approved or cleared by the [place] FDA and has been authorized for detection and/or diagnosis of SARS-CoV-2 by FDA under an Emergency Use Authorization (EUA). This EUA will remain in effect (meaning this test can be used) for the duration of the COVID-19 declaration under Section 564(b)(1) of the Act, 21 U.S.C. section 360bbb-3(b)(1), unless the authorization is terminated or revoked.  Performed at [facility], [address] [place], [place] [address]       Studies: No results found.  Scheduled Meds: . allopurinol  300 mg Oral Daily  .  aspirin  325 mg Oral Daily  . carvedilol  6.25 mg Oral BID  . enoxaparin (LOVENOX) injection  40 mg Subcutaneous Q24H  . ferrous sulfate  325 mg Oral Daily  . rosuvastatin  10 mg Oral Daily    Continuous Infusions: . sodium chloride    . azithromycin Stopped (01/22/21 2008)  . cefTRIAXone (ROCEPHIN)  IV 2 g (01/23/21 1638)     LOS: 1 day     Darlin Drop, MD [facility] Pager [phone]  If 7PM-7AM, please contact night-coverage [email or web address] Password [facility] 01/23/2021, 5:23 PM

## 2021-01-23 NOTE — Evaluation (Signed)
Physical Therapy Evaluation Patient Details Name: Eric [name] MRN: [id number] DOB: [birth year] Today's Date: 01/23/2021   History of Present Illness  The pt is a 65 yo male presenting 3/4 with chest pain, right scapular pain, and abdominal pain and SOB x3-4 days. Imaging revealed bilateral lower lobe opacities consistent with pneumonia. PMH includes: CKD III, HTN, GERD, and arthritis.    Clinical Impression  Pt in bed upon arrival of PT, agreeable to evaluation at this time. Prior to admission the pt was completely independent with mobility and ADLs, had returned to walking 3 miles 2-3x weekly following surgery in January. The pt now presents with minor limitations in endurance and dynamic stability, but is safe to return home with family supervision and assist as needed. He was able to complete mobility in the room independently, but was also able to complete hallway ambulation and stair navigation with supervision for line management, VSS, and no change in SOB. The pt reports he is at his baseline level of mobility, and has no concerns regarding return home. The pt has no further acute PT needs at this time. Please feel free to re-consult if change in status.     Follow Up Recommendations No PT follow up;Supervision - Intermittent    Equipment Recommendations  None recommended by PT    Recommendations for Other Services       Precautions / Restrictions Precautions Precautions: None Precaution Comments: watch SpO2 Restrictions Weight Bearing Restrictions: No      Mobility  Bed Mobility Overal bed mobility: Independent                  Transfers Overall transfer level: Independent Equipment used: None             General transfer comment: no evidence of instability  Ambulation/Gait Ambulation/Gait assistance: Supervision Gait Distance (Feet): 250 Feet Assistive device: None Gait Pattern/deviations: WFL(Within Functional Limits) Gait velocity: 1.0 m/s but able  to increase speed without issue Gait velocity interpretation: >2.62 ft/sec, indicative of community ambulatory General Gait Details: supervision for safety and O2 management, no physical assist or instability  Stairs Stairs: Yes Stairs assistance: Supervision Stair Management: No rails;Alternating pattern;Forwards Number of Stairs: 2 (x6) General stair comments: 6 sets of 2 without UE support. able to manage with VSS and no SOB      Balance Overall balance assessment: Mild deficits observed, not formally tested                                           Pertinent Vitals/Pain Pain Assessment: No/denies pain    Home Living Family/patient expects to be discharged to:: Private residence Living Arrangements: Spouse/significant other Available Help at Discharge: Family;Available PRN/intermittently Type of Home: House Home Access: Stairs to enter Entrance Stairs-Rails: Right Entrance Stairs-Number of Steps: 2 Home Layout: Two level Home Equipment: None      Prior Function Level of Independence: Independent         Comments: no use of AD, returned to walking 3 miles 2-3 times per week following heart surgery in january     Hand Dominance   Dominant Hand: Right    Extremity/Trunk Assessment   Upper Extremity Assessment Upper Extremity Assessment: Overall WFL for tasks assessed    Lower Extremity Assessment Lower Extremity Assessment: Overall WFL for tasks assessed    Cervical / Trunk Assessment Cervical / Trunk Assessment: Normal  Communication   Communication: No difficulties  Cognition Arousal/Alertness: Awake/alert Behavior During Therapy: WFL for tasks assessed/performed Overall Cognitive Status: Within Functional Limits for tasks assessed                                        General Comments General comments (skin integrity, edema, etc.): VSS with gait and stairs with RA. increase in RR to 30s with activity. Pt states  this is normal for him while exercising in mask        Assessment/Plan    PT Assessment Patent does not need any further PT services     PT Treatment Interventions      PT Goals (Current goals can be found in the Care Plan section)  Acute Rehab PT Goals Patient Stated Goal: return to walking PT Goal Formulation: With patient Time For Goal Achievement: 02/06/21 Potential to Achieve Goals: Good     AM-PAC PT "6 Clicks" Mobility  Outcome Measure Help needed turning from your back to your side while in a flat bed without using bedrails?: None Help needed moving from lying on your back to sitting on the side of a flat bed without using bedrails?: None Help needed moving to and from a bed to a chair (including a wheelchair)?: None Help needed standing up from a chair using your arms (e.g., wheelchair or bedside chair)?: None Help needed to walk in hospital room?: None Help needed climbing 3-5 steps with a railing? : A Little 6 Click Score: 23    End of Session   Activity Tolerance: Patient tolerated treatment well Patient left: in bed;with call bell/phone within reach;with family/visitor present Nurse Communication: Mobility status PT Visit Diagnosis: Other abnormalities of gait and mobility (R26.89)    Time: 0865-7846 PT Time Calculation (min) (ACUTE ONLY): 16 min   Charges:   PT Evaluation $PT Eval Low Complexity: 1 Low          Rolm Baptise, PT, DPT   Acute Rehabilitation Department Pager #: [phone]  Gaetana Michaelis 01/23/2021, 5:11 PM

## 2021-01-23 NOTE — Plan of Care (Signed)
  Problem: Education: Goal: Knowledge of General Education information will improve Description: Including pain rating scale, medication(s)/side effects and non-pharmacologic comfort measures Outcome: Not Progressing   Problem: Health Behavior/Discharge Planning: Goal: Ability to manage health-related needs will improve Outcome: Not Progressing   Problem: Clinical Measurements: Goal: Ability to maintain clinical measurements within normal limits will improve Outcome: Not Progressing Goal: Will remain free from infection Outcome: Not Progressing Goal: Diagnostic test results will improve Outcome: Not Progressing Goal: Respiratory complications will improve Outcome: Not Progressing Goal: Cardiovascular complication will be avoided Outcome: Not Progressing   Problem: Activity: Goal: Risk for activity intolerance will decrease Outcome: Not Progressing   Problem: Nutrition: Goal: Adequate nutrition will be maintained Outcome: Not Progressing   Problem: Coping: Goal: Level of anxiety will decrease Outcome: Not Progressing   Problem: Elimination: Goal: Will not experience complications related to bowel motility Outcome: Not Progressing Goal: Will not experience complications related to urinary retention Outcome: Not Progressing   Problem: Pain Managment: Goal: General experience of comfort will improve Outcome: Not Progressing   Problem: Safety: Goal: Ability to remain free from injury will improve Outcome: Not Progressing   Problem: Skin Integrity: Goal: Risk for impaired skin integrity will decrease Outcome: Not Progressing   Problem: Activity: Goal: Ability to tolerate increased activity will improve Outcome: Not Progressing   Problem: Clinical Measurements: Goal: Ability to maintain a body temperature in the normal range will improve Outcome: Not Progressing   Problem: Respiratory: Goal: Ability to maintain adequate ventilation will improve Outcome: Not  Progressing Goal: Ability to maintain a clear airway will improve Outcome: Not Progressing   

## 2021-01-23 NOTE — Progress Notes (Signed)
Updated patient and wife via phone on current management.  Repeating labs this afternoon and will follow results.  Plan to switch to oral antibiotics in the morning and likely discharge to home tomorrow if no acute events overnight.

## 2021-01-24 DIAGNOSIS — J189 Pneumonia, unspecified organism: Secondary | ICD-10-CM | POA: Diagnosis not present

## 2021-01-24 LAB — OCCULT BLOOD X 1 CARD TO LAB, STOOL: Fecal Occult Bld: NEGATIVE

## 2021-01-24 LAB — HEMOGLOBIN AND HEMATOCRIT, BLOOD
HCT: 29.3 % — ABNORMAL LOW (ref 39.0–52.0)
Hemoglobin: 10.4 g/dL — ABNORMAL LOW (ref 13.0–17.0)

## 2021-01-24 LAB — MAGNESIUM: Magnesium: 2.3 mg/dL (ref 1.7–2.4)

## 2021-01-24 LAB — POTASSIUM: Potassium: 4.3 mmol/L (ref 3.5–5.1)

## 2021-01-24 MED ORDER — POLYETHYLENE GLYCOL 3350 17 G PO PACK: 17.0000 g | PACK | Freq: Every day | ORAL | 0 refills | Status: DC | PRN

## 2021-01-24 MED ORDER — METHOCARBAMOL 500 MG PO TABS
500.0000 mg | ORAL_TABLET | Freq: Three times a day (TID) | ORAL | 0 refills | Status: AC | PRN
Start: 2021-01-24 — End: 2021-02-03

## 2021-01-24 MED ORDER — SODIUM CHLORIDE 0.9 % IV SOLN
510.0000 mg | Freq: Once | INTRAVENOUS | Status: AC
  Administered 2021-01-24: 510 mg via INTRAVENOUS
  Filled 2021-01-24: qty 17

## 2021-01-24 MED ORDER — CEFDINIR 300 MG PO CAPS: 300.0000 mg | ORAL_CAPSULE | Freq: Two times a day (BID) | ORAL | Status: DC

## 2021-01-24 MED ORDER — SACCHAROMYCES BOULARDII 250 MG PO CAPS: 250.0000 mg | ORAL_CAPSULE | Freq: Two times a day (BID) | ORAL | Status: DC

## 2021-01-24 MED ORDER — POTASSIUM CHLORIDE CRYS ER 20 MEQ PO TBCR
40.0000 meq | EXTENDED_RELEASE_TABLET | Freq: Two times a day (BID) | ORAL | Status: DC
  Administered 2021-01-24: 40 meq via ORAL
  Filled 2021-01-24: qty 2

## 2021-01-24 MED ORDER — MAGNESIUM SULFATE 2 GM/50ML IV SOLN
2.0000 g | Freq: Once | INTRAVENOUS | Status: AC
  Administered 2021-01-24: 2 g via INTRAVENOUS
  Filled 2021-01-24: qty 50

## 2021-01-24 MED ORDER — SENNOSIDES-DOCUSATE SODIUM 8.6-50 MG PO TABS
2.0000 | ORAL_TABLET | Freq: Two times a day (BID) | ORAL | Status: DC
  Filled 2021-01-24: qty 2

## 2021-01-24 MED ORDER — SACCHAROMYCES BOULARDII 250 MG PO CAPS: 250.0000 mg | ORAL_CAPSULE | Freq: Two times a day (BID) | ORAL | 0 refills | Status: AC

## 2021-01-24 MED ORDER — CEFDINIR 300 MG PO CAPS: 300.0000 mg | ORAL_CAPSULE | Freq: Two times a day (BID) | ORAL | 0 refills | Status: AC

## 2021-01-24 NOTE — Discharge Summary (Addendum)
Discharge Summary  JADIAN KARMAN ZOX:[id number] DOB: August 04, 1956  PCP: Elias Else, MD  Admit date: 01/22/2021 Discharge date: 01/24/2021  Time spent: 35 minutes.  Recommendations for Outpatient Follow-up:  1. Follow-up with your PCP in 1 to 2 weeks. 2. Follow-up with GI in 1 to 2 weeks. 3. Follow-up with your nephrologist in 1 to 2 weeks. 4. Follow-up with your cardiologist in 1 to 2 weeks. 5. Recommended annual imaging follow-up by CTA or MRA of 4 cm ascending thoracic aortic aneurysm noted on CTA done on 01/22/2021.    Discharge Diagnoses:  Active Hospital Problems   Diagnosis Date Noted  . CAP (community acquired pneumonia) 01/22/2021  . S/P AVR (aortic valve replacement) 11/23/2020  . CKD (chronic kidney disease), stage III (HCC) 07/09/2019  . Essential hypertension 08/31/2017    Resolved Hospital Problems  No resolved problems to display.    Discharge Condition: Stable.  Diet recommendation: Resume previous diet.  Vitals:   01/24/21 0745 01/24/21 1207  BP: (!) 156/98 (!) 148/101  Pulse: 77 87  Resp: 20 20  Temp: 98.8 F (37.1 C) 98.7 F (37.1 C)  SpO2: 96%     History of present illness:  Rivan Siordia Reidis an 65 y.o.malewith PMHwith PMH significant for HTN, CKD2 status post AVR last month who was in his usual state of health until last week when he developed bilateral shoulder pain associated with shortness of breath. Work-up revealed community-acquired pneumonia and incidentally found ascending thoracic aortic aneurysm of 4 cm.  Patient has received 2 days of IV azithromycin and Rocephin.  Switched to cefdinir on 01/24/2021.    For his back pain he has received Robaxin intravenously which was switched to p.o. Robaxin on 01/23/2021.  He tolerated his therapies well.    Hospital course was complicated by electrolytes derangement which were repleted.  His AKI has resolved.    Iron studies were obtained and were suggestive of iron deficiency.  Received 1 dose of IV  Feraheme.  Due to his iron deficiency anemia, FOBT was obtained which returned negative.  Patient was advised to continue his iron supplement as recommended by his nephrologist and to follow-up with GI Dr. Bosie Clos outpatient.  Patient understands and agrees with plan.  01/24/21: Seen at his bedside.  He is eager to go home.  He has no new complaints.   Hospital Course:  Principal Problem:   CAP (community acquired pneumonia) Active Problems:   Essential hypertension   CKD (chronic kidney disease), stage III (HCC)   S/P AVR (aortic valve replacement)  Resolved sepsis secondary to community-acquired pneumonia, POA. Presented with leukocytosis, fever with T-max of 100.5, tachypnea and evidence of pneumonia on chest x-ray and CT scan chest. No evidence of thoracic or abdominal aortic dissection.  A 4 cm ascending thoracic aortic aneurysm was seen. Received 2 days of Rocephin and IV azithromycin Switched to cefdinir on 01/24/2021 x 8 days to complete 10-day course of antibiotics. Currently afebrile with no leukocytosis. O2 saturation 96% on room air.  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 Incidental findings on CTA chest done on 01/22/2021. Recommended annual imaging follow-up by CTA or MRA of 4 cm ascending thoracic aortic aneurysm noted on CTA done on 01/22/2021.    Resolved Back pain, suspect 2/2 muscle spasms Responded well to IV Robaxin Switched to po Robaxin PRN on 01/23/2021  Resolved electrolytes derangement Serum potassium 4.3 on 01/24/2021 from 3.4 on 01/23/2021. Serum magnesium 2.3 on 01/24/2021 from 1.6 on 01/23/2021.  Elevated troponin, likely demand  ischemia in the setting of tachycardia, sepsis Troponin peaked at 19 and trended down No longer tachycardic.  Suspect tachycardia was secondary to sepsis. No evidence of acute ischemia on 12 lead EKG He denies any anginal symptoms. Follow-up with your cardiologist in 1 to 2 weeks.  Chronic microcytic anemia/iron deficiency  anemia Baseline hemoglobin appears to be 11 This morning drop in hemoglobin 8.7 from 9.5 with MCV of 69 FOBT negative on 01/24/2021. Reported hemoglobin 10.4K on 01/24/2021 from 8.7K on 01/24/2021. Iron studies suggestive of iron deficiency with saturation ratios of 6 and iron level of 13 on 01/23/2021. Received 1 dose of IV Feraheme on 01/24/2021. Continue home iron supplement. Reportedly had 2 colonoscopies in the past with polyps removed, last colonoscopy was about 4 years ago. Was seen by Dr. Bosie Clos, [place]. Follow-up with GI in 1 to 2 weeks to rule out GI as a cause of your anemia.  AKI on CKD 2 He appears to be back to his baseline creatinine 1.2 with GFR greater than 60. Presented with creatinine of 1.7 and GFR of 44. Continue to avoid NSAIDs and nephrotoxic agents. Follow-up with your PCP in 1 to 2 weeks.  Gout Stable. Continue home allopurinol.  Hyperlipidemia Continue home Crestor.   Code Status: Full code.  Family Communication:  Updated his wife via phone on 01/23/2021.    Consultants:  None  Procedures:  None.  Antimicrobials:  Rocephin from 01/22/2021-01/23/2021  Azithromycin from 01/22/2021-01/23/2021.  Cefdinir started on 01/24/21 x 8 days.   Discharge Exam: BP (!) 148/101 Comment: RN aware  Pulse 87   Temp 98.7 F (37.1 C) (Oral)   Resp 20   Ht  (1.778 m)   Wt 100.2 kg   SpO2 96%   BMI 31.70 kg/m  . General: 65 y.o. year-old male well developed well nourished in no acute distress.  Alert and oriented x3. . Cardiovascular: Regular rate and rhythm with no rubs or gallops.  No thyromegaly or JVD noted.   Marland Kitchen Respiratory: Clear to auscultation with no wheezes or rales. Good inspiratory effort. . Abdomen: Soft nontender nondistended with normal bowel sounds x4 quadrants. . Musculoskeletal: No lower extremity edema bilaterally. . Skin: No ulcerative lesions noted or rashes, . Psychiatry: Mood is appropriate for condition and  setting  Discharge Instructions You were cared for by a hospitalist during your hospital stay. If you have any questions about your discharge medications or the care you received while you were in the hospital after you are discharged, you can call the unit and asked to speak with the hospitalist on call if the hospitalist that took care of you is not available. Once you are discharged, your primary care physician will handle any further medical issues. Please note that NO REFILLS for any discharge medications will be authorized once you are discharged, as it is imperative that you return to your primary care physician (or establish a relationship with a primary care physician if you do not have one) for your aftercare needs so that they can reassess your need for medications and monitor your lab values.   Allergies as of 01/24/2021      Reactions   Amlodipine Cough      Medication List    STOP taking these medications   ibuprofen 200 MG tablet Commonly known as: ADVIL     TAKE these medications   acetaminophen 500 MG tablet Commonly known as: TYLENOL Take 1,000 mg by mouth every 6 (six) hours as needed (pain).  allopurinol 300 MG tablet Commonly known as: ZYLOPRIM Take 300 mg by mouth daily.   aspirin 325 MG EC tablet Take 1 tablet (325 mg total) by mouth daily.   carvedilol 6.25 MG tablet Commonly known as: COREG Take 1 tablet (6.25 mg total) by mouth 2 (two) times daily.   cefdinir 300 MG capsule Commonly known as: OMNICEF Take 1 capsule (300 mg total) by mouth every 12 (twelve) hours for 8 days.   ferrous sulfate 325 (65 FE) MG tablet Take 325 mg by mouth daily.   ICY HOT EX Apply 1 application topically daily as needed (pain).   methocarbamol 500 MG tablet Commonly known as: ROBAXIN Take 1 tablet (500 mg total) by mouth every 8 (eight) hours as needed for up to 10 days for muscle spasms.   polyethylene glycol 17 g packet Commonly known as: MIRALAX / GLYCOLAX Take  17 g by mouth daily as needed for mild constipation.   rosuvastatin 10 MG tablet Commonly known as: CRESTOR Take 1 tablet (10 mg total) by mouth daily.   saccharomyces boulardii 250 MG capsule Commonly known as: FLORASTOR Take 1 capsule (250 mg total) by mouth 2 (two) times daily for 8 days.      Allergies  Allergen Reactions  . Amlodipine Cough    Follow-up Information    Elias Else, MD. Call in 1 day(s).   Specialty: Family Medicine Why: Please call for a post hospital follow-up appointment. Contact information: [address] [place] [address] [phone]        Lyn Records, MD .   Specialty: Cardiology Contact information: [phone] [address] [place] [address] [phone]        Charlott Rakes, MD. Call in 1 day(s).   Specialty: Gastroenterology Why: Please call for a post hospital follow-up appointment Contact information: [address] [place] [address] [phone]                The results of significant diagnostics from this hospitalization (including imaging, microbiology, ancillary and laboratory) are listed below for reference.    Significant Diagnostic Studies: DG Chest 2 View  Result Date: 01/22/2021 CLINICAL DATA:  Chest pain EXAM: CHEST - 2 VIEW COMPARISON:  12/23/2020 FINDINGS: . airspace opacities in both lung bases, left greater than right concerning for pneumonia. Small left effusion suspected. Heart is normal size. No acute bony abnormality. IMPRESSION: Bilateral lower lobe airspace opacities, left greater than right concerning for pneumonia. Small left effusion. Electronically Signed   By: Charlett Nose M.D.   On: 01/22/2021 11:37   ECHOCARDIOGRAM COMPLETE  Result Date: 01/20/2021    ECHOCARDIOGRAM REPORT   Patient Name:   KIYOTO SLOMSKI Date of Exam: 01/20/2021 Medical Rec #:  [id number]     Height:       70.0 in Accession #:    [id number]    Weight:       214.0 lb Date of  Birth:  [birth year]     BSA:          2.148 m Patient Age:    64 years      BP:           160/105 mmHg Patient Gender: M             HR:           98 bpm. Exam Location:  [address] Procedure: 2D Echo, Cardiac Doppler and Color Doppler Indications:  I35.0 Aortic Stenosis  History:        Patient has prior history of Echocardiogram examinations, most                 recent 09/04/2020. Signs/Symptoms:Murmur; Risk                 Factors:Hypertension and CKD.  Sonographer:    Clearence Ped RCS Referring Phys: [phone] [facility] [place] IMPRESSIONS  1. Left ventricular ejection fraction, by estimation, is 40 to 45%. The left ventricle has mildly decreased function. The left ventricle demonstrates regional wall motion abnormalities. Septal hypokinesis. There is moderate left ventricular hypertrophy.  Left ventricular diastolic parameters are indeterminate.  2. Right ventricular systolic function is mildly reduced. The right ventricular size is normal. There is normal pulmonary artery systolic pressure. The estimated right ventricular systolic pressure is 26.2 mmHg.  3. The mitral valve is normal in structure. Trivial mitral valve regurgitation.  4. Aortic dilatation noted. There is mild dilatation of the ascending aorta, measuring 39 mm.  5. The inferior vena cava is normal in size with greater than 50% respiratory variability, suggesting right atrial pressure of 3 mmHg.  6. The aortic valve has been repaired/replaced. There is a 25 mm Edwards Inspiris resilia valve present in the aortic position. Aortic valve regurgitation is not visualized. Aortic valve mean gradient measures 16.0 mmHg. FINDINGS  Left Ventricle: Left ventricular ejection fraction, by estimation, is 40 to 45%. The left ventricle has mildly decreased function. The left ventricle demonstrates regional wall motion abnormalities. The left ventricular internal cavity size was normal in size. There is moderate left ventricular hypertrophy. Left ventricular  diastolic parameters are indeterminate. Right Ventricle: The right ventricular size is normal. Right vetricular wall thickness was not assessed. Right ventricular systolic function is mildly reduced. There is normal pulmonary artery systolic pressure. The tricuspid regurgitant velocity is 2.41  m/s, and with an assumed right atrial pressure of 3 mmHg, the estimated right ventricular systolic pressure is 26.2 mmHg. Left Atrium: Left atrial size was normal in size. Right Atrium: Right atrial size was normal in size. Pericardium: Trivial pericardial effusion is present. Mitral Valve: The mitral valve is normal in structure. Trivial mitral valve regurgitation. Tricuspid Valve: The tricuspid valve is normal in structure. Tricuspid valve regurgitation is trivial. Aortic Valve: The aortic valve has been repaired/replaced. Aortic valve regurgitation is not visualized. Aortic valve mean gradient measures 16.0 mmHg. Aortic valve peak gradient measures 25.0 mmHg. Aortic valve area, by VTI measures 1.13 cm. There is a  25 mm Edwards Inspiris resilia valve present in the aortic position. Pulmonic Valve: The pulmonic valve was not well visualized. Pulmonic valve regurgitation is trivial. Aorta: The aortic root is normal in size and structure and aortic dilatation noted. There is mild dilatation of the ascending aorta, measuring 39 mm. Venous: The inferior vena cava is normal in size with greater than 50% respiratory variability, suggesting right atrial pressure of 3 mmHg. IAS/Shunts: The interatrial septum was not well visualized.  LEFT VENTRICLE PLAX 2D LVIDd:         4.70 cm  Diastology LVIDs:         3.40 cm  LV e' medial:    12.50 cm/s LV PW:         1.50 cm  LV E/e' medial:  5.9 LV IVS:        1.30 cm  LV e' lateral:   10.00 cm/s LVOT diam:     2.00 cm  LV E/e' lateral:  7.4 LV SV:         45 LV SV Index:   21 LVOT Area:     3.14 cm  RIGHT VENTRICLE RV Basal diam:  3.10 cm RV S prime:     9.90 cm/s TAPSE (M-mode): 1.5 cm  RVSP:           26.2 mmHg LEFT ATRIUM             Index       RIGHT ATRIUM           Index LA diam:        4.10 cm 1.91 cm/m  RA Pressure: 3.00 mmHg LA Vol (A2C):   74.9 ml 34.87 ml/m RA Area:     20.00 cm LA Vol (A4C):   58.6 ml 27.28 ml/m RA Volume:   56.60 ml  26.35 ml/m LA Biplane Vol: 68.0 ml 31.65 ml/m  AORTIC VALVE AV Area (Vmax):    1.09 cm AV Area (Vmean):   1.01 cm AV Area (VTI):     1.13 cm AV Vmax:           250.00 cm/s AV Vmean:          193.000 cm/s AV VTI:            0.400 m AV Peak Grad:      25.0 mmHg AV Mean Grad:      16.0 mmHg LVOT Vmax:         86.40 cm/s LVOT Vmean:        62.300 cm/s LVOT VTI:          0.144 m LVOT/AV VTI ratio: 0.36  AORTA Ao Root diam: 3.90 cm Ao Asc diam:  3.90 cm MITRAL VALVE               TRICUSPID VALVE MV Area (PHT):             TR Peak grad:   23.2 mmHg MV Decel Time:             TR Vmax:        241.00 cm/s MV E velocity: 74.00 cm/s  Estimated RAP:  3.00 mmHg                            RVSP:           26.2 mmHg                             SHUNTS                            Systemic VTI:  0.14 m                            Systemic Diam: 2.00 cm Epifanio Lesches MD Electronically signed by Epifanio Lesches MD Signature Date/Time: 01/20/2021/4:58:32 PM    Final    CT Angio Chest/Abd/Pel for Dissection W and/or W/WO  Result Date: 01/22/2021 CLINICAL DATA:  Chest pain. EXAM: CT ANGIOGRAPHY CHEST, ABDOMEN AND PELVIS TECHNIQUE: Non-contrast CT of the chest was initially obtained. Multidetector CT imaging through the chest, abdomen and pelvis was performed using the standard protocol during bolus administration of intravenous contrast. Multiplanar reconstructed images and MIPs were obtained and reviewed to evaluate the vascular anatomy. CONTRAST:  41mL OMNIPAQUE IOHEXOL  350 MG/ML SOLN COMPARISON:  October 23, 2020. FINDINGS: CTA CHEST FINDINGS Cardiovascular: No evidence of thoracic aortic dissection. Status post aortic valve repair. 4 cm ascending thoracic  aortic aneurysm is noted. Great vessels are widely patent without stenosis. Mild cardiomegaly is noted. No pericardial effusion is noted. Mediastinum/Nodes: No enlarged mediastinal, hilar, or axillary lymph nodes. Thyroid gland, trachea, and esophagus demonstrate no significant findings. Lungs/Pleura: No pneumothorax is noted. Small bilateral pleural effusions are noted with adjacent bibasilar subsegmental atelectasis or infiltrates. Musculoskeletal: No chest wall abnormality. No acute or significant osseous findings. Review of the MIP images confirms the above findings. CTA ABDOMEN AND PELVIS FINDINGS VASCULAR Aorta: Normal caliber aorta without aneurysm, dissection, vasculitis or significant stenosis. Celiac: Patent without evidence of aneurysm, dissection, vasculitis or significant stenosis. SMA: Patent without evidence of aneurysm, dissection, vasculitis or significant stenosis. Renals: Both renal arteries are patent without evidence of aneurysm, dissection, vasculitis, fibromuscular dysplasia or significant stenosis. IMA: Patent without evidence of aneurysm, dissection, vasculitis or significant stenosis. Inflow: Patent without evidence of aneurysm, dissection, vasculitis or significant stenosis. Veins: No obvious venous abnormality within the limitations of this arterial phase study. Review of the MIP images confirms the above findings. NON-VASCULAR Hepatobiliary: No focal liver abnormality is seen. No gallstones, gallbladder wall thickening, or biliary dilatation. Pancreas: Unremarkable. No pancreatic ductal dilatation or surrounding inflammatory changes. Spleen: Normal in size without focal abnormality. Adrenals/Urinary Tract: Adrenal glands appear normal. Right renal cyst is noted. No hydronephrosis or renal obstruction is noted. No renal or ureteral calculi are noted. Urinary bladder is unremarkable. Stomach/Bowel: Stomach is within normal limits. Appendix appears normal. No evidence of bowel wall  thickening, distention, or inflammatory changes. Sigmoid diverticulosis is noted without inflammation. Lymphatic: No adenopathy is noted. Reproductive: Prostate is unremarkable. Other: No abdominal wall hernia or abnormality. No abdominopelvic ascites. Musculoskeletal: No acute or significant osseous findings. Review of the MIP images confirms the above findings. IMPRESSION: 1. No evidence of thoracic or abdominal aortic dissection. 2. 4 cm ascending thoracic aortic aneurysm is noted. Recommend annual imaging followup by CTA or MRA. This recommendation follows 2010 ACCF/AHA/AATS/ACR/ASA/SCA/SCAI/SIR/STS/SVM Guidelines for the Diagnosis and Management of Patients with Thoracic Aortic Disease. Circulation. 2010; 121: G269-S854. Aortic aneurysm NOS (ICD10-I71.9). 3. Status post aortic valve repair. 4. Small bilateral pleural effusions are noted with adjacent bibasilar subsegmental atelectasis or infiltrates. 5. Sigmoid diverticulosis without inflammation. Electronically Signed   By: Lupita Raider M.D.   On: 01/22/2021 15:08   LONG TERM MONITOR (3-14 DAYS)  Result Date: 01/03/2021  Basic rhythm is NSR with 1 degree AVB  Occasional asymptomatic Mobitz 1 second degree AVB  PVC's with burden ~ 3 %  Rare PAC's  No atrial fibrillation or other significant arrhythmia  Abnormal study with asymptomatic 2 degree AVB and PVC's with 3% burden. Patch Wear Time:  6 days and 20 hours (2022-01-26T10:30:01-499 to 2022-02-02T07:11:25-0500) Patient had a min HR of 33 bpm, max HR of 119 bpm, and avg HR of 83 bpm. Predominant underlying rhythm was Sinus Rhythm. First Degree AV Block was present. Second Degree AV Block-Mobitz I (Wenckebach) was present. Isolated SVEs were rare (<1.0%), SVE Couplets were rare (<1.0%), and SVE Triplets were rare (<1.0%). Isolated VEs were occasional (2.9%, [id number]), VE Couplets were rare (<1.0%, 319), and VE Triplets were rare (<1.0%, 23). Ventricular Bigeminy and Trigeminy were present.     Microbiology: Recent Results (from the past 240 hour(s))  Culture, blood (routine x 2)     Status: None (Preliminary result)   Collection  Time: 01/22/21 11:21 AM   Specimen: BLOOD  Result Value Ref Range Status   Specimen Description BLOOD RIGHT ANTECUBITAL  Final   Special Requests   Final    BOTTLES DRAWN AEROBIC AND ANAEROBIC Blood Culture adequate volume   Culture   Final    NO GROWTH 2 DAYS Performed at [facility], [address] [place], [place] [address]    Report Status PENDING  Incomplete  Resp Panel by RT-PCR (Flu A&B, Covid) Nasopharyngeal Swab     Status: None   Collection Time: 01/22/21 11:27 AM   Specimen: Nasopharyngeal Swab; Nasopharyngeal(NP) swabs in vial transport medium  Result Value Ref Range Status   SARS Coronavirus 2 by RT PCR NEGATIVE NEGATIVE Final    Comment: (NOTE) SARS-CoV-2 target nucleic acids are NOT DETECTED.  The SARS-CoV-2 RNA is generally detectable in upper respiratory specimens during the acute phase of infection. The lowest concentration of SARS-CoV-2 viral copies this assay can detect is 138 copies/mL. A negative result does not preclude SARS-Cov-2 infection and should not be used as the sole basis for treatment or other patient management decisions. A negative result may occur with  improper specimen collection/handling, submission of specimen other than nasopharyngeal swab, presence of viral mutation(s) within the areas targeted by this assay, and inadequate number of viral copies(<138 copies/mL). A negative result must be combined with clinical observations, patient history, and epidemiological information. The expected result is Negative.  Fact Sheet for Patients:  BloggerCourse.com  Fact Sheet for Healthcare Providers:  SeriousBroker.it  This test is no t yet approved or cleared by the [place] FDA and  has been authorized for detection and/or diagnosis of  SARS-CoV-2 by FDA under an Emergency Use Authorization (EUA). This EUA will remain  in effect (meaning this test can be used) for the duration of the COVID-19 declaration under Section 564(b)(1) of the Act, 21 U.S.C.section 360bbb-3(b)(1), unless the authorization is terminated  or revoked sooner.       Influenza A by PCR NEGATIVE NEGATIVE Final   Influenza B by PCR NEGATIVE NEGATIVE Final    Comment: (NOTE) The Xpert Xpress SARS-CoV-2/FLU/RSV plus assay is intended as an aid in the diagnosis of influenza from Nasopharyngeal swab specimens and should not be used as a sole basis for treatment. Nasal washings and aspirates are unacceptable for Xpert Xpress SARS-CoV-2/FLU/RSV testing.  Fact Sheet for Patients: BloggerCourse.com  Fact Sheet for Healthcare Providers: SeriousBroker.it  This test is not yet approved or cleared by the [place] FDA and has been authorized for detection and/or diagnosis of SARS-CoV-2 by FDA under an Emergency Use Authorization (EUA). This EUA will remain in effect (meaning this test can be used) for the duration of the COVID-19 declaration under Section 564(b)(1) of the Act, 21 U.S.C. section 360bbb-3(b)(1), unless the authorization is terminated or revoked.  Performed at [facility], [address], [place], [place] [address]      Labs: Basic Metabolic Panel: Recent Labs  Lab 01/22/21 1059 01/23/21 0427 01/23/21 1733 01/24/21 1224  NA 136 136 142  --   K 3.8 3.7 3.4* 4.3  CL 103 106 113*  --   CO2 23 21* 20*  --   GLUCOSE 143* 107* 82  --   BUN 20 15 13   --   CREATININE 1.71* 1.48* 1.28*  --   CALCIUM 9.1 8.5* 7.0*  --   MG  --   --  1.6* 2.3  PHOS  --   --  2.5  --    Liver Function Tests: No results for input(s): AST, ALT, ALKPHOS, BILITOT, PROT, ALBUMIN in the last 168 hours. No results for input(s): LIPASE, AMYLASE in the last 168 hours. No results for input(s): AMMONIA  in the last 168 hours. CBC: Recent Labs  Lab 01/22/21 1059 01/23/21 0427 01/23/21 1733 01/24/21 1224  WBC 13.6* 9.9 7.3  --   HGB 11.5* 9.5* 8.7* 10.4*  HCT 33.0* 26.9* 24.9* 29.3*  MCV 70.8* 69.3* 70.7*  --   PLT 264 238 236  --    Cardiac Enzymes: No results for input(s): CKTOTAL, CKMB, CKMBINDEX, TROPONINI in the last 168 hours. BNP: BNP (last 3 results) Recent Labs    01/22/21 1122  BNP 118.3*    ProBNP (last 3 results) No results for input(s): PROBNP in the last 8760 hours.  CBG: No results for input(s): GLUCAP in the last 168 hours.     Signed:  Darlin Droparole N Namish Krise, MD [facility] 01/24/2021, 5:06 PM

## 2021-01-24 NOTE — Discharge Instructions (Signed)
Community-Acquired Pneumonia, Adult Pneumonia is an infection of the lungs. It causes irritation and swelling in the airways of the lungs. Mucus and fluid may also build up inside the airways. This may cause coughing and trouble breathing. One type of pneumonia can happen while you are in a hospital. A different type can happen when you are not in a hospital (community-acquired pneumonia). What are the causes? This condition is caused by germs (viruses, bacteria, or fungi). Some types of germs can spread from person to person. Pneumonia is not thought to spread from person to person.   What increases the risk? You are more likely to develop this condition if:  You have a long-term (chronic) disease, such as: ? Disease of the lungs. This may be chronic obstructive pulmonary disease (COPD) or asthma. ? Heart failure. ? Cystic fibrosis. ? Diabetes. ? Kidney disease. ? Sickle cell disease. ? HIV.  You have other health problems, such as: ? Your body's defense system (immune system) is weak. ? A condition that may cause you to breathe in fluids from your mouth and nose.  You had your spleen taken out.  You do not take good care of your teeth and mouth (poor dental hygiene).  You use or have used tobacco products.  You travel where the germs that cause this illness are common.  You are near certain animals or the places they live.  You are older than 64 years of age. What are the signs or symptoms? Symptoms of this condition include:  A cough.  A fever.  Sweating or chills.  Chest pain, often when you breathe deeply or cough.  Breathing problems, such as: ? Fast breathing. ? Trouble breathing. ? Shortness of breath.  Feeling tired (fatigued).  Muscle aches. How is this treated? Treatment for this condition depends on many things, such as:  The cause of your illness.  Your medicines.  Your other health problems. Most adults can be treated at home. Sometimes,  treatment must happen in a hospital.  Treatment may include medicines to kill germs.  Medicines may depend on which germ caused your illness. Very bad pneumonia is rare. If you get it, you may:  Have a machine to help you breathe.  Have fluid taken away from around your lungs. Follow these instructions at home: Medicines  Take over-the-counter and prescription medicines only as told by your doctor.  Take cough medicine only if you are losing sleep. Cough medicine can keep your body from taking mucus away from your lungs.  If you were prescribed an antibiotic medicine, take it as told by your doctor. Do not stop taking the antibiotic even if you start to feel better. Lifestyle  Do not drink alcohol.  Do not use any products that contain nicotine or tobacco, such as cigarettes, e-cigarettes, and chewing tobacco. If you need help quitting, ask your doctor.  Eat a healthy diet. This includes a lot of vegetables, fruits, whole grains, low-fat dairy products, and low-fat (lean) protein.      General instructions  Rest a lot. Sleep for at least 8 hours each night.  Sleep with your head and neck raised. Put a few pillows under your head or sleep in a reclining chair.  Return to your normal activities as told by your doctor. Ask your doctor what activities are safe for you.  Drink enough fluid to keep your pee (urine) pale yellow.  If your throat is sore, rinse your mouth often with salt water. To make salt  water, dissolve -1 tsp (3-6 g) of salt in 1 cup (237 mL) of warm water.  Keep all follow-up visits as told by your doctor. This is important.   How is this prevented? You can lower your risk of pneumonia by:  Getting the pneumonia shot (vaccine). These shots have different types and schedules. Ask your doctor what works best for you. Think about getting this shot if: ? You are older than 64 years of age. ? You are 85-54 years of age and:  You are being treated for  cancer.  You have long-term lung disease.  You have other problems that affect your body's defense system. Ask your doctor if you have one of these.  Getting your flu shot every year. Ask your doctor which type of shot is best for you.  Going to the dentist as often as told.  Washing your hands often with soap and water for at least 20 seconds. If you cannot use soap and water, use hand sanitizer. Contact a doctor if:  You have a fever.  You lose sleep because your cough medicine does not help. Get help right away if:  You are short of breath and this gets worse.  You have more chest pain.  Your sickness gets worse. This is very serious if: ? You are an older adult. ? Your body's defense system is weak.  You cough up blood. These symptoms may be an emergency. Do not wait to see if the symptoms will go away. Get medical help right away. Call your local emergency services (911 in the [place] Do not drive yourself to the hospital. Summary  Pneumonia is an infection of the lungs.  Community-acquired pneumonia affects people who have not been in the hospital. Certain germs can cause this infection.  This condition may be treated with medicines that kill germs.  For very bad pneumonia, you may need a hospital stay and treatment to help with breathing. This information is not intended to replace advice given to you by your health care provider. Make sure you discuss any questions you have with your health care provider. Document Revised: 08/20/2019 Document Reviewed: 08/20/2019 Elsevier Patient Education  2021 [place].   Chronic Kidney Disease, Adult Chronic kidney disease is when lasting damage happens to the kidneys slowly over a long time. The kidneys help to:  Make pee (urine).  Make hormones.  Keep the right amount of fluids and chemicals in the body. Most often, this disease does not go away. You must take steps to help keep the kidney damage from getting worse.  If steps are not taken, the kidneys might stop working forever. What are the causes?  Diabetes.  High blood pressure.  Diseases that affect the heart and blood vessels.  Other kidney diseases.  Diseases of the body's disease-fighting system.  A problem with the flow of pee.  Infections of the organs that make pee, store it, and take it out of the body.  Swelling or irritation of your blood vessels. What increases the risk?  Getting older.  Having someone in your family who has kidney disease or kidney failure.  Having a disease caused by genes.  Taking medicines often that harm the kidneys.  Being near or having contact with harmful substances.  Being very overweight.  Using tobacco now or in the past. What are the signs or symptoms?  Feeling very tired.  Having a swollen face, legs, ankles, or feet.  Feeling like you may vomit or vomiting.  Not feeling hungry.  Being confused or not able to focus.  Twitches and cramps in the leg muscles or other muscles.  Dry, itchy skin.  A taste of metal in your mouth.  Making less pee, or making more pee.  Shortness of breath.  Trouble sleeping. You may also become anemic or get weak bones. Anemic means there is not enough red blood cells or hemoglobin in your blood. You may get symptoms slowly. You may not notice them until the kidney damage gets very bad. How is this treated? Often, there is no cure for this disease. Treatment can help with symptoms and help keep the disease from getting worse. You may need to:  Avoid alcohol.  Avoid foods that are high in salt, potassium, phosphorous, and protein.  Take medicines for symptoms and to help control other conditions.  Have dialysis. This treatment gets harmful waste out of your body.  Treat other problems that cause your kidney disease or make it worse. Follow these instructions at home: Medicines  Take over-the-counter and prescription medicines only as  told by your doctor.  Do not take any new medicines, vitamins, or supplements unless your doctor says it is okay. Lifestyle  Do not smoke or use any products that contain nicotine or tobacco. If you need help quitting, ask your doctor.  If you drink alcohol: ? Limit how much you use to:  0-1 drink a day for women who are not pregnant.  0-2 drinks a day for men. ? Know how much alcohol is in your drink. In the [place] one drink equals one 12 oz bottle of beer (355 mL), one 5 oz glass of wine (148 mL), or one 1 oz glass of hard liquor (44 mL).  Stay at a healthy weight. If you need help losing weight, ask your doctor.   General instructions  Follow instructions from your doctor about what you cannot eat or drink.  Track your blood pressure at home. Tell your doctor about any changes.  If you have diabetes, track your blood sugar.  Exercise at least 30 minutes a day, 5 days a week.  Keep your shots (vaccinations) up to date.  Keep all follow-up visits.   Where to find more information  American Association of Kidney Patients: ResidentialShow.is  [employer]: www.kidney.org  American Kidney [employer]: FightingMatch.com.ee  Life Options: www.lifeoptions.org  [employer]ney [employer]: www.kidneyschool.org Contact a doctor if:  Your symptoms get worse.  You get new symptoms. Get help right away if:  You get symptoms of end-stage kidney disease. These include: ? Headaches. ? Losing feeling in your hands or feet. ? Easy bruising. ? Having hiccups often. ? Chest pain. ? Shortness of breath. ? Lack of menstrual periods, in women.  You have a fever.  You make less pee than normal.  You have pain or you bleed when you pee or poop. These symptoms may be an emergency. Get help right away. Call your local emergency services (911 in the [place]  Do not wait to see if the symptoms will go away.  Do not drive yourself to the hospital. Summary  Chronic kidney disease is when lasting  damage happens to the kidneys slowly over a long time.  Causes of this disease include diabetes and high blood pressure.  Often, there is no cure for this disease. Treatment can help symptoms and help keep the disease from getting worse.  Treatment may involve lifestyle changes, medicines, and dialysis. This information is not intended to replace advice  given to you by your health care provider. Make sure you discuss any questions you have with your health care provider. Document Revised: 02/12/2020 Document Reviewed: 02/12/2020 Elsevier Patient Education  [place].   Preventing Chronic Kidney Disease Chronic kidney disease (CKD) occurs when the kidneys are slowly and permanently damaged over a long period of time. The kidneys are two organs that do many important jobs in the body, including:  Removing waste and extra fluid from the blood to make urine.  Making hormones that maintain the amount of fluid in tissues and blood vessels.  Maintaining the right amount of fluids and electrolytes in the body. A small amount of kidney damage may not cause problems, but a large amount of damage may make it hard or impossible for the kidneys to work the way they should. CKD gets worse over time. You can take steps to prevent CKD or to keep it from getting worse. The best way to prevent kidney damage is to know your risk factors and make changes before you develop symptoms of CKD. How can this condition affect me? At first, you may not notice any signs or symptoms of CKD. Symptoms develop slowly and may not be obvious until the kidney damage becomes severe. If steps are not taken to prevent or slow down the disease, CKD can lead to:  A low red blood cell count (anemia).  Heart disease.  Weak bones.  Nerve damage.  Stroke.  Kidney failure and dialysis.  Changes in urination, such as less urine, more urine, or blood in the urine. What can increase my risk? You are more likely to  develop CKD if you:  Are 30 years of age or older.  Are obese.  Have taken certain medicines for a long time.  Use tobacco or have used it in the past.  Have any of the following conditions: ? Diabetes. ? High blood pressure. ? Heart disease. ? Multiple myeloma. ? An autoimmune disease. ? Frequent urinary tract infections. ? Polycystic kidney disease. ? High cholesterol.  Have a family history of kidney disease, heart disease, diabetes, or high blood pressure.  Have problems with urine flow that may be caused by: ? Cancer. ? Having kidney stones more than once. ? An enlarged prostate in males. What actions can I take to prevent CKD? Managing conditions that put you at risk  Talk to your health care provider about your kidney health and your risk factors for CKD.  Work with your health care provider to manage conditions such as high blood pressure, diabetes, or high cholesterol. This may involve taking medicines, eating healthy, or making lifestyle changes to help get the following measures down to the target that your health care provider recommends: ? Blood pressure. ? Blood sugar (glucose) levels. ? Cholesterol. Eating and drinking  Follow instructions from your health care provider about diet. This may include: ? Limiting salt (sodium) intake. You should have less than 1 tsp (2,300 mg) of sodium a day. If you have heart disease or high blood pressure, you should have less than  tsp (1,725 mg) of sodium a day. ? Limiting protein intake as told by your health care provider. Avoid high-protein foods. ? Eating a balanced, heart-healthy diet. ? Avoiding foods that are high in potassium and phosphorous.  Limit alcohol. If you drink alcohol: ? Limit how much you use to:  0-1 drink a day for women who are not pregnant.  0-2 drinks a day for men. ? Know how  much alcohol is in your drink. In the [place] one drink equals one 12 oz bottle of beer (355 mL), one 5 oz glass of  wine (148 mL), or one 1 oz glass of hard liquor (44 mL).  If you have diabetes, work with a Financial planner (Firefighter) or a certified diabetes educator to develop a healthy eating plan.  Talk with your health care provider about how much fluid you should drink each day.   Lifestyle  Exercise for at least 30 minutes on 5 or more days of the week, or as much as told by your health care provider.  Keep your weight at a healthy level. If you are overweight or obese, lose weight as told by your health care provider.  Do not use any products that contain nicotine or tobacco, such as cigarettes, e-cigarettes, and chewing tobacco. If you need help quitting, ask your health care provider.   General instructions  Take over-the-counter and prescription medicines only as told by your health care provider. Do not take any new medicines unless approved by your health care provider.  Use NSAIDs, such as ibuprofen, for pain only when necessary. Ask your health care provider about other pain medicines that do not increase your risk of developing CKD.  Have a yearly physical exam.  Learn about your family's medical history. Talk to your relatives and siblings about diabetes, heart disease, and high blood pressure. Where to find more information Learn more about CKD and how to prevent CKD from:  [place]: www.kidney.org  American Association of Kidney Patients: BombTimer.gl  American Diabetes Association: www.diabetes.org Summary  Symptoms of CKD develop slowly and may not be obvious until the kidney damage becomes severe.  The best way to prevent kidney damage is to know your risk factors and make nutrition and lifestyle changes before you develop symptoms of CKD.  Follow instructions from your health care provider about diet, which may include limiting how much salt, protein, and alcohol you consume.  Work with your health care provider to keep your blood  pressure, cholesterol, and blood sugar levels within the recommended range. This information is not intended to replace advice given to you by your health care provider. Make sure you discuss any questions you have with your health care provider. Document Revised: 03/05/2020 Document Reviewed: 02/07/2020 Elsevier Patient Education  2021 [place] for Chronic Kidney Disease Chronic kidney disease (CKD) occurs when the kidneys are permanently damaged over a long period of time. When your kidneys are not working well, they cannot remove waste, fluids, and other substances from your blood as well as they did before. The substances can build up, which can worsen kidney damage and affect how your body functions. Certain foods lead to a buildup of these substances. By changing your diet, you can help prevent more kidney damage and delay or prevent the need for dialysis. What are tips for following this plan? Reading food labels  Check the amount of salt (sodium) in foods. Choose foods that have less than 300 milligrams (mg) per serving.  Check the ingredient list for phosphorus or potassium-based additives or preservatives.  Check the amount of saturated fat and trans fat. Limit or avoid these fats as told by your dietitian. Shopping  Avoid buying foods that are: ? Processed or prepackaged. ? Calcium-enriched or that have calcium added to them (are fortified).  Do not buy foods that have salt or sodium listed among the first five ingredients.  Buy canned vegetables and beans that say "no salt added" or "low sodium" and rinse them before eating. Cooking  Soak vegetables, such as potatoes, before cooking to reduce potassium. To do this: 1. Peel and cut the vegetables into small pieces. 2. Soak the vegetables in warm water for at least 2 hours. For every 1 cup of vegetables, use 10 cups of water. 3. Drain and rinse the vegetables with warm water. 4. Boil the vegetables for at  least 5 minutes. Meal planning  Limit the amount of protein you eat from plant and animal sources each day.  Do not add salt to food when cooking or before eating.  Eat meals and snacks at around the same time each day. General information  Talk with your health care provider about whether you should take a vitamin and mineral supplement.  Use standard measuring cups and spoons to measure servings of foods. Use a kitchen scale to measure portions of protein foods.  If told by your health care provider, avoid drinking too much fluid. Measure and count all liquids, including water, ice, soups, flavored gelatin, and frozen desserts such as ice pops or ice cream. If you have diabetes:  If you have diabetes (diabetes mellitus) and CKD, it is important to keep your blood sugar (glucose) in the target range recommended by your health care provider. Follow your diabetes management plan. This may include: ? Checking your blood glucose regularly. ? Taking medicines by mouth, taking insulin, or taking both. ? Exercising for at least 30 minutes on 5 or more days each week, or as told by your health care provider. ? Tracking how many servings of carbohydrates you eat at each meal.  You may be given specific guidelines on how much of certain foods and nutrients you may eat, depending on your stage of kidney disease and whether you have high blood pressure (hypertension). Follow your meal plan as told by your dietitian. What nutrients should I limit? Work with your health care provider and dietitian to develop a meal plan that is right for you. Foods you can eat and foods you should limit or avoid will depend on the stage of your kidney disease and any other health conditions you have. The items listed below are not a complete list. Talk with your dietitian about what dietary choices are best for you. Potassium Potassium affects how steadily your heart beats. If too much potassium builds up in your  blood, the potassium can cause an irregular heartbeat or even a heart attack. You may need to limit or avoid foods that are high in potassium, such as:  Milk and soy milk.  Fruits, such as bananas, apricots, nectarines, melon, prunes, raisins, kiwi, and oranges.  Vegetables, such as potatoes, sweet potatoes, yams, tomatoes, leafy greens, beets, avocado, pumpkin, and winter squash.  White and lima beans.  Whole-wheat breads and pastas.  Beans and nuts. Phosphorus Phosphorus is a mineral found in your bones. A balance between calcium and phosphorus is needed to build and maintain healthy bones. Too much phosphorus pulls calcium from your bones. This can make your bones weak and more likely to break. Too much phosphorus can also make your skin itch. You may need to limit or avoid foods that are high in phosphorus, such as:  Milk and dairy products.  Dried beans and peas.  Tofu, soy milk, and other soy-based meat replacements.  Dark-colored sodas.  Nuts and peanut butter.  Meat, poultry, and fish.  Bran cereals and oatmeal.  Protein Protein helps you make and keep muscle. It also helps to repair your body's cells and tissues. One of the natural breakdown products of protein is a waste product called urea. When your kidneys are not working properly, they cannot remove wastes, such as urea. Reducing how much protein you eat can help prevent a buildup of urea in your blood. Depending on your stage of kidney disease, you may need to limit foods that are high in protein. Sources of animal protein include:  Meat (all types).  Fish and seafood.  Poultry.  Eggs.  Dairy. Other protein foods include:  Beans and legumes.  Nuts and nut butter.  Soy and tofu.   Sodium Sodium helps to maintain a healthy balance of fluids in your body. Too much sodium can increase your blood pressure and have a negative effect on your heart and lungs. Too much sodium can also cause your body to retain  too much fluid, making your kidneys work harder. Most people should have less than 2,300 mg of sodium each day. If you have hypertension, you may need to limit your sodium to 1,500 mg each day. You may need to limit or avoid foods that are high in sodium, such as:  Salt seasonings.  Soy sauce.  Cured and processed meats.  Salted crackers and snack foods.  Fast food.  Canned soups and most canned foods.  Pickled foods.  Vegetable juice.  Boxed mixes or ready-to-eat boxed meals and side dishes.  Bottled dressings, sauces, and marinades. Talk with your dietitian about how much potassium, phosphorus, protein, and sodium you may have each day. Summary  Chronic kidney disease (CKD) can lead to a buildup of waste and extra substances in the body. Certain foods lead to a buildup of these substances. By changing your diet as told, you can help prevent more kidney damage and delay or prevent the need for dialysis.  Food intake changes are different for each person with CKD. Work with a dietitian to set up nutrient goals and a meal plan that is right for you.  If you have diabetes and CKD, it is important to keep your blood sugar in the target range recommended by your health care provider. This information is not intended to replace advice given to you by your health care provider. Make sure you discuss any questions you have with your health care provider. Document Revised: 03/02/2020 Document Reviewed: 03/02/2020 Elsevier Patient Education  2021 [employer].

## 2021-01-27 LAB — CULTURE, BLOOD (ROUTINE X 2)
Culture: NO GROWTH
Special Requests: ADEQUATE

## 2021-01-30 NOTE — Progress Notes (Signed)
Cardiology Office Note:    Date:  02/01/2021   ID:  Eric, [name] Mar 09, 1956, MRN [id number]  PCP:  Maury Dus, MD  Cardiologist:  Sinclair Grooms, MD   Referring MD: Maury Dus, MD   Chief Complaint  Patient presents with  . Chest Pain  . Shortness of Breath    History of Present Illness:    Eric [name] is a 65 y.o. male with a hx of severe aortic regurgitation,s/p AVR 11/25/2020,  hypertension, left ventricular hypertrophy, chronic kidney disease stage III, Covid 19 infection(September 20200, and recent admission for bilateral CAP vs post-cardiotomy syndrome 01/22/2021.  Mr. Eric [name] has finished up antibiotic therapy.  Pleurisy has resolved.  He denies chills and fever.  He can take a deep breath without chest pain.  Overall he feels much better after being admitted to the hospital on 01/22/2021.  The day prior to admission he had an echocardiogram performed here which surprisingly showed reduction in LV function with EF 45%.  LVEF by TEE on the day of valve replacement was 40%.  He does not have symptoms of heart failure.  On exam today pleural effusions have improved by auscultation.  He has no peripheral edema.  Past Medical History:  Diagnosis Date  . Allergy    ALLERGIC RHINITIS..WORSE SPRING/FALL  . Aortic valve regurgitation 07/10/14   ECHO @ [place]  . Arthritis   . Chronic kidney disease    Stage 3 , followed by [facility]  . GERD (gastroesophageal reflux disease)    CURRENTLY DIET CONTROLLED  . Heart murmur   . Hypertension   . Pneumonia 06/2019  . Sinusitis, chronic     Past Surgical History:  Procedure Laterality Date  . AORTIC VALVE REPLACEMENT N/A 11/23/2020   Procedure: AORTIC VALVE REPLACEMENT (AVR) USING EDWARDS INSPIRIS RESILIA 25 MM AORTIC VALVE;  Surgeon: Gaye Pollack, MD;  Location: [facility];  Service: Open Heart Surgery;  Laterality: N/A;  . COLONOSCOPY    . KNEE ARTHROSCOPY Bilateral   . LASIK    . TEE WITHOUT CARDIOVERSION N/A  11/23/2020   Procedure: TRANSESOPHAGEAL ECHOCARDIOGRAM (TEE);  Surgeon: Gaye Pollack, MD;  Location: [facility];  Service: Open Heart Surgery;  Laterality: N/A;    Current Medications: Current Meds  Medication Sig  . acetaminophen (TYLENOL) 500 MG tablet Take 1,000 mg by mouth every 6 (six) hours as needed (pain).  Marland Kitchen allopurinol (ZYLOPRIM) 300 MG tablet Take 300 mg by mouth daily.  Marland Kitchen amoxicillin (AMOXIL) 500 MG capsule Take 4 tablets (2067m total) by mouth 1 hour prior to dental work  . aspirin EC 325 MG EC tablet Take 1 tablet (325 mg total) by mouth daily.  . carvedilol (COREG) 6.25 MG tablet Take 1 tablet (6.25 mg total) by mouth 2 (two) times daily.  . cefdinir (OMNICEF) 300 MG capsule Take 1 capsule (300 mg total) by mouth every 12 (twelve) hours for 8 days.  . ferrous sulfate 325 (65 FE) MG tablet Take 325 mg by mouth daily.  . Menthol, Topical Analgesic, (ICY HOT EX) Apply 1 application topically daily as needed (pain).  . methocarbamol (ROBAXIN) 500 MG tablet Take 1 tablet (500 mg total) by mouth every 8 (eight) hours as needed for up to 10 days for muscle spasms.  . polyethylene glycol (MIRALAX / GLYCOLAX) 17 g packet Take 17 g by mouth daily as needed for mild constipation.  . rosuvastatin (CRESTOR) 10 MG tablet Take 1 tablet (10 mg total) by  mouth daily.  Marland Kitchen saccharomyces boulardii (FLORASTOR) 250 MG capsule Take 1 capsule (250 mg total) by mouth 2 (two) times daily for 8 days.  . sacubitril-valsartan (ENTRESTO) 24-26 MG Take 1 tablet by mouth 2 (two) times daily.     Allergies:   Amlodipine   Social History   Socioeconomic History  . Marital status: Married    Spouse name: Barnett Applebaum  . Number of children: 3  . Years of education: 52  . Highest education level: Master's degree (e.g., MA, MS, MEng, MEd, MSW, MBA)  Occupational History  . Occupation: Chief Financial Officer  Tobacco Use  . Smoking status: Never Smoker  . Smokeless tobacco: Never Used  Vaping Use  . Vaping Use: Never used   Substance and Sexual Activity  . Alcohol use: Yes    Alcohol/week: 2.0 standard drinks    Types: 2 Standard drinks or equivalent per week    Comment: occasional  . Drug use: No  . Sexual activity: Yes  Other Topics Concern  . Not on file  Social History Narrative  . Not on file   Social Determinants of Health   Financial Resource Strain: Not on file  Food Insecurity: Not on file  Transportation Needs: Not on file  Physical Activity: Not on file  Stress: Not on file  Social Connections: Not on file     Family History: The patient's family history includes Cancer in his brother, father, and maternal grandmother; Diabetes in his brother and daughter; Heart disease in his maternal grandmother; Hypertension in his mother.  ROS:   Please see the history of present illness.    He will be having dental work done relatively soon.  We discussed amoxicillin endocarditis prophylaxis 2 g 1 hour prior to anticipated surgery/dental work.  All other systems reviewed and are negative.  EKGs/Labs/Other Studies Reviewed:    The following studies were reviewed today:  ECHOCARDIOGRAM 01/20/2021: IMPRESSIONS    1. Left ventricular ejection fraction, by estimation, is 40 to 45%. The  left ventricle has mildly decreased function. The left ventricle  demonstrates regional wall motion abnormalities. Septal hypokinesis. There  is moderate left ventricular hypertrophy.  Left ventricular diastolic parameters are indeterminate.  2. Right ventricular systolic function is mildly reduced. The right  ventricular size is normal. There is normal pulmonary artery systolic  pressure. The estimated right ventricular systolic pressure is 98.2 mmHg.  3. The mitral valve is normal in structure. Trivial mitral valve  regurgitation.  4. Aortic dilatation noted. There is mild dilatation of the ascending  aorta, measuring 39 mm.  5. The inferior vena cava is normal in size with greater than 50%   respiratory variability, suggesting right atrial pressure of 3 mmHg.  6. The aortic valve has been repaired/replaced. There is a 25 mm Edwards  Inspiris resilia valve present in the aortic position. Aortic valve  regurgitation is not visualized. Aortic valve mean gradient measures 16.0  mmHg.    CT CHEST 01/22/2021: IMPRESSION: 1. No evidence of thoracic or abdominal aortic dissection. 2. 4 cm ascending thoracic aortic aneurysm is noted. Recommend annual imaging followup by CTA or MRA. This recommendation follows 2010 ACCF/AHA/AATS/ACR/ASA/SCA/SCAI/SIR/STS/SVM Guidelines for the Diagnosis and Management of Patients with Thoracic Aortic Disease. Circulation. 2010; 121: M415-A309. Aortic aneurysm NOS (ICD10-I71.9). 3. Status post aortic valve repair. 4. Small bilateral pleural effusions are noted with adjacent bibasilar subsegmental atelectasis or infiltrates. 5. Sigmoid diverticulosis without inflammation.   EKG:  EKG not performed  Recent Labs: 11/19/2020: ALT 28 01/22/2021: B Natriuretic  Peptide 118.3 01/23/2021: BUN 13; Creatinine, Ser 1.28; Platelets 236; Sodium 142 01/24/2021: Hemoglobin 10.4; Magnesium 2.3; Potassium 4.3  Recent Lipid Panel    Component Value Date/Time   CHOL 144 05/15/2020 0852   TRIG 58 05/15/2020 0852   HDL 55 05/15/2020 0852   CHOLHDL 2.6 05/15/2020 0852   LDLCALC 77 05/15/2020 0852    Physical Exam:    VS:  BP 128/84   Pulse 85   Ht 5' 10"  (1.778 m)   Wt 213 lb 12.8 oz (97 kg)   SpO2 98%   BMI 30.68 kg/m     Wt Readings from Last 3 Encounters:  02/01/21 213 lb 12.8 oz (97 kg)  01/24/21 220 lb 14.4 oz (100.2 kg)  12/23/20 214 lb (97.1 kg)     GEN: Healthy-appearing. No acute distress HEENT: Normal NECK: No JVD. LYMPHATICS: No lymphadenopathy CARDIAC: 1/6 to 2/6 systolic murmur. RRR no gallop, or edema. VASCULAR:  Normal Pulses. No bruits. RESPIRATORY:  Clear to auscultation without rales, wheezing or rhonchi  ABDOMEN: Soft,  non-tender, non-distended, No pulsatile mass, MUSCULOSKELETAL: No deformity  SKIN: Warm and dry NEUROLOGIC:  Alert and oriented x 3 PSYCHIATRIC:  Normal affect   ASSESSMENT:    1. S/P AVR (aortic valve replacement)   2. Postcardiotomy syndrome   3. Chronic combined systolic and diastolic heart failure ([place])   4. Mobitz (type) I (Wenckebach's) atrioventricular block   5. Nonrheumatic aortic valve insufficiency   6. Hyperlipidemia with target LDL less than 70   7. Essential hypertension   8. Benign hypertensive heart and CKD, stage 3 (GFR 30-59), w CHF ([place])    PLAN:    In order of problems listed above:  1. Normal function by echo and auscultation 2. Possible recent CAP vs post-cardiotomy syndrome.  Antibiotics completed.  Clinically improved. 3. Reduction in LV function is new.  Basal to mid septal wall motion abnormality potentially compatible with that seen after open heart surgery.  Blood pressure is a little high today.  Start Entresto 24/26 mg p.o. twice daily.  Continue carvedilol 6.25 mg twice daily.  Be met in 1 week.  Will reassess LV function in 3 to 6 months.  Basic metabolic panel will be obtained in 1 to 2 weeks. 4. EKG is not performed today 5. Status post TAVR 6. No side effects to Crestor. 7. Repeat blood pressure 140/90 mmHg.  Patient is concerned that at home he has been running between 135 and 841 mmHg systolic with diastolic pressures between 85 to 95 mmHg.  I have started [facility] which should further control blood pressure.  Discussed the rationale for starting renin-angiotensin system blockade.   Medication Adjustments/Labs and Tests Ordered: Current medicines are reviewed at length with the patient today.  Concerns regarding medicines are outlined above.  Orders Placed This Encounter  Procedures  . Basic metabolic panel   Meds ordered this encounter  Medications  . amoxicillin (AMOXIL) 500 MG capsule    Sig: Take 4 tablets (2038m total) by mouth 1  hour prior to dental work    Dispense:  4 capsule    Refill:  3  . sacubitril-valsartan (ENTRESTO) 24-26 MG    Sig: Take 1 tablet by mouth 2 (two) times daily.    Dispense:  60 tablet    Refill:  11    Patient Instructions  Medication Instructions:  1) START Entresto 24/224mtwice daily 2) TAKE Amoxicillin 2,00085m hour prior to dental work  *If you need a refill on  your cardiac medications before your next appointment, please call your pharmacy*   Lab Work: BMET in 1 week  If you have labs (blood work) drawn today and your tests are completely normal, you will receive your results only by: Marland Kitchen MyChart Message (if you have MyChart) OR . A paper copy in the mail If you have any lab test that is abnormal or we need to change your treatment, we will call you to review the results.   Testing/Procedures: None   Follow-Up: At [facility], you and your health needs are our priority.  As part of our continuing mission to provide you with exceptional heart care, we have created designated Provider Care Teams.  These Care Teams include your primary Cardiologist (physician) and Advanced Practice Providers (APPs -  Physician Assistants and Nurse Practitioners) who all work together to provide you with the care you need, when you need it.  We recommend signing up for the patient portal called "MyChart".  Sign up information is provided on this After Visit Summary.  MyChart is used to connect with patients for Virtual Visits (Telemedicine).  Patients are able to view lab/test results, encounter notes, upcoming appointments, etc.  Non-urgent messages can be sent to your provider as well.   To learn more about what you can do with MyChart, go to [email or web address].    Your next appointment:   3 month(s)  The format for your next appointment:   In Person  Provider:   You may see Sinclair Grooms, MD or one of the following Advanced Practice Providers on your designated Care Team:     Kathyrn Drown, NP    Other Instructions      Signed, Sinclair Grooms, MD  02/01/2021 5:47 PM    [place]

## 2021-02-01 ENCOUNTER — Other Ambulatory Visit: Payer: Self-pay

## 2021-02-01 ENCOUNTER — Ambulatory Visit (INDEPENDENT_AMBULATORY_CARE_PROVIDER_SITE_OTHER): Payer: Managed Care, Other (non HMO) | Admitting: Interventional Cardiology

## 2021-02-01 ENCOUNTER — Encounter: Payer: Self-pay | Admitting: Interventional Cardiology

## 2021-02-01 VITALS — BP 128/84 | HR 85 | Ht 70.0 in | Wt 213.8 lb

## 2021-02-01 DIAGNOSIS — Z952 Presence of prosthetic heart valve: Secondary | ICD-10-CM

## 2021-02-01 DIAGNOSIS — I970 Postcardiotomy syndrome: Secondary | ICD-10-CM | POA: Diagnosis not present

## 2021-02-01 DIAGNOSIS — E785 Hyperlipidemia, unspecified: Secondary | ICD-10-CM

## 2021-02-01 DIAGNOSIS — I441 Atrioventricular block, second degree: Secondary | ICD-10-CM | POA: Diagnosis not present

## 2021-02-01 DIAGNOSIS — I5042 Chronic combined systolic (congestive) and diastolic (congestive) heart failure: Secondary | ICD-10-CM | POA: Diagnosis not present

## 2021-02-01 DIAGNOSIS — I130 Hypertensive heart and chronic kidney disease with heart failure and stage 1 through stage 4 chronic kidney disease, or unspecified chronic kidney disease: Secondary | ICD-10-CM

## 2021-02-01 DIAGNOSIS — I351 Nonrheumatic aortic (valve) insufficiency: Secondary | ICD-10-CM

## 2021-02-01 DIAGNOSIS — N183 Chronic kidney disease, stage 3 unspecified: Secondary | ICD-10-CM

## 2021-02-01 DIAGNOSIS — I10 Essential (primary) hypertension: Secondary | ICD-10-CM

## 2021-02-01 MED ORDER — ENTRESTO 24-26 MG PO TABS: 1.0000 | ORAL_TABLET | Freq: Two times a day (BID) | ORAL | 11 refills | Status: DC

## 2021-02-01 MED ORDER — AMOXICILLIN 500 MG PO CAPS: ORAL_CAPSULE | ORAL | 3 refills | Status: DC

## 2021-02-01 NOTE — Patient Instructions (Signed)
Medication Instructions:  1) START Entresto 24/26mg  twice daily 2) TAKE Amoxicillin 2,000mg  1 hour prior to dental work  *If you need a refill on your cardiac medications before your next appointment, please call your pharmacy*   Lab Work: BMET in 1 week  If you have labs (blood work) drawn today and your tests are completely normal, you will receive your results only by: Marland Kitchen MyChart Message (if you have MyChart) OR . A paper copy in the mail If you have any lab test that is abnormal or we need to change your treatment, we will call you to review the results.   Testing/Procedures: None   Follow-Up: At [facility] you and your health needs are our priority.  As part of our continuing mission to provide you with exceptional heart care, we have created designated Provider Care Teams.  These Care Teams include your primary Cardiologist (physician) and Advanced Practice Providers (APPs -  Physician Assistants and Nurse Practitioners) who all work together to provide you with the care you need, when you need it.  We recommend signing up for the patient portal called "MyChart".  Sign up information is provided on this After Visit Summary.  MyChart is used to connect with patients for Virtual Visits (Telemedicine).  Patients are able to view lab/test results, encounter notes, upcoming appointments, etc.  Non-urgent messages can be sent to your provider as well.   To learn more about what you can do with MyChart, go to [email or web address].    Your next appointment:   3 month(s)  The format for your next appointment:   In Person  Provider:   You may see Lesleigh Noe, MD or one of the following Advanced Practice Providers on your designated Care Team:    Georgie Chard, NP    Other Instructions

## 2021-02-11 ENCOUNTER — Telehealth: Payer: Self-pay

## 2021-02-11 NOTE — Telephone Encounter (Signed)
Patient contacted the office with questions regarding medication and lifting restrictions.  Patient is s/p AVR with Dr. Laneta Simmers 11/23/20.    Patient requesting clarification on when he would be able to stop his 325mg  ASA.  Advised patient that his Cardiologist will decide when it would be best to discontinue the medication and that he could contact Dr. office for clarification.  Also, patient's 12 week post op lifting restrictions will be lifted 02/15/21.  Patient aware and acknowledged receipt.

## 2021-02-12 ENCOUNTER — Other Ambulatory Visit: Payer: Self-pay

## 2021-02-12 ENCOUNTER — Other Ambulatory Visit: Payer: Managed Care, Other (non HMO) | Admitting: *Deleted

## 2021-02-12 DIAGNOSIS — I5042 Chronic combined systolic (congestive) and diastolic (congestive) heart failure: Secondary | ICD-10-CM

## 2021-02-12 LAB — BASIC METABOLIC PANEL
BUN/Creatinine Ratio: 11 (ref 10–24)
BUN: 18 mg/dL (ref 8–27)
CO2: 21 mmol/L (ref 20–29)
Calcium: 9.7 mg/dL (ref 8.6–10.2)
Chloride: 99 mmol/L (ref 96–106)
Creatinine, Ser: 1.62 mg/dL — ABNORMAL HIGH (ref 0.76–1.27)
Glucose: 87 mg/dL (ref 65–99)
Potassium: 4.8 mmol/L (ref 3.5–5.2)
Sodium: 138 mmol/L (ref 134–144)
eGFR: 47 mL/min/{1.73_m2} — ABNORMAL LOW (ref >59)

## 2021-02-16 ENCOUNTER — Telehealth: Payer: Self-pay | Admitting: *Deleted

## 2021-02-16 NOTE — Telephone Encounter (Signed)
Mr. Lipinski, s/p AVR 11/23/20, contacted the office stating he is experiencing pain between his shoulder blades radiating to his right shoulder. Patient states pain is constant. States pain is not aggravated by anything in particular. Advised patient to contact PCP or Cardiologist to discuss pain. Patient verbalized understanding.

## 2021-02-19 ENCOUNTER — Ambulatory Visit
Admission: RE | Admit: 2021-02-19 | Discharge: 2021-02-19 | Disposition: A | Discharge status: Home / Self Care | Payer: Managed Care, Other (non HMO) | Source: Ambulatory Visit | Attending: Family Medicine | Admitting: Family Medicine

## 2021-02-19 ENCOUNTER — Other Ambulatory Visit: Payer: Self-pay | Admitting: Family Medicine

## 2021-02-19 DIAGNOSIS — J690 Pneumonitis due to inhalation of food and vomit: Secondary | ICD-10-CM

## 2021-02-21 IMAGING — CT CT ANGIO CHEST
2 of 7 series · 15 of 36 positions shown · IV contrast (APPLIED)
Comparison: No priors.

CLINICAL DATA: 64-year-old male with history of severe aortic
stenosis under preprocedural evaluation prior to potential
transcatheter aortic valve replacement (TAVR) procedure.

EXAM:
CT ANGIOGRAPHY CHEST WITH CONTRAST
TECHNIQUE: Multidetector CT imaging of the chest was performed using the
standard protocol during bolus administration of intravenous
contrast. Multiplanar CT image reconstructions and MIPs were
obtained to evaluate the vascular anatomy.
CONTRAST:  80mL OMNIPAQUE IOHEXOL 350 MG/ML SOLN

[Series 15: thins · axial · 0.75mm/px · z∈[-123,+117]mm · 12 of 407 slices shown]
[im 32/407  lung]
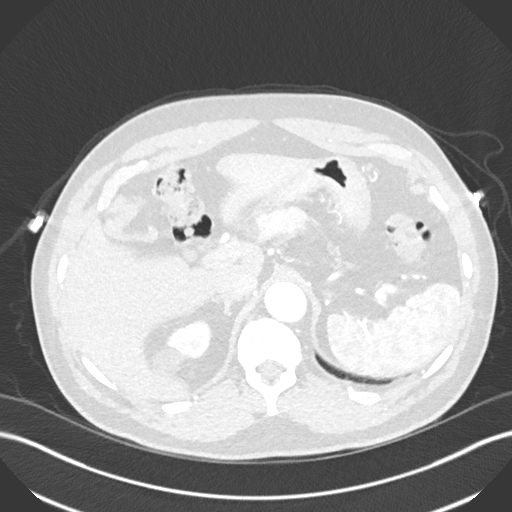
[im 63/407  mediastinal]
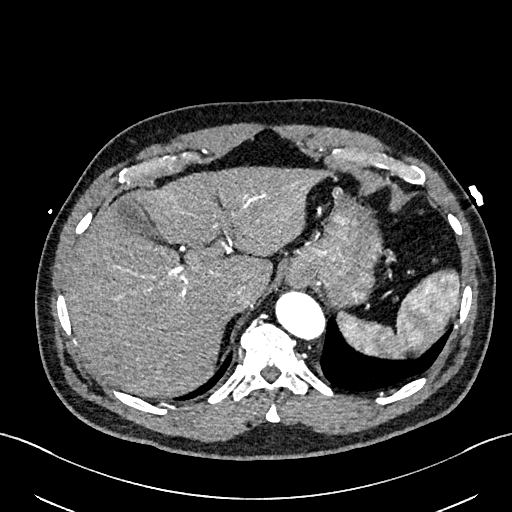
[im 94/407  lung]
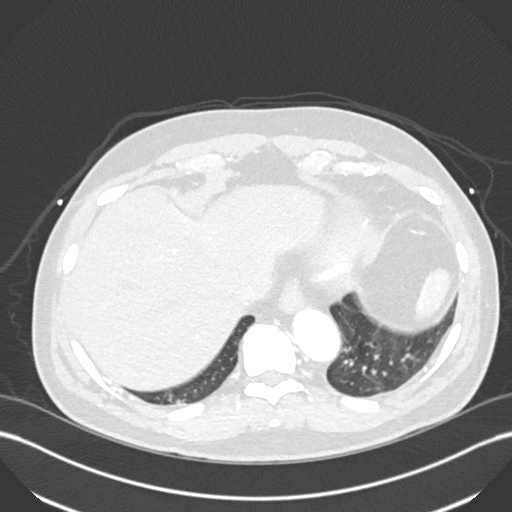
[im 125/407  mediastinal]
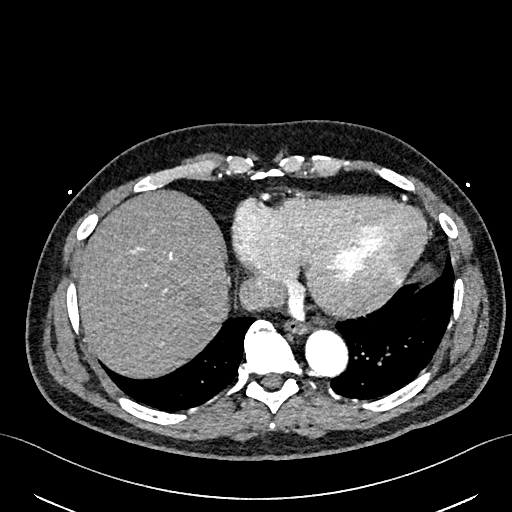
[im 157/407  lung]
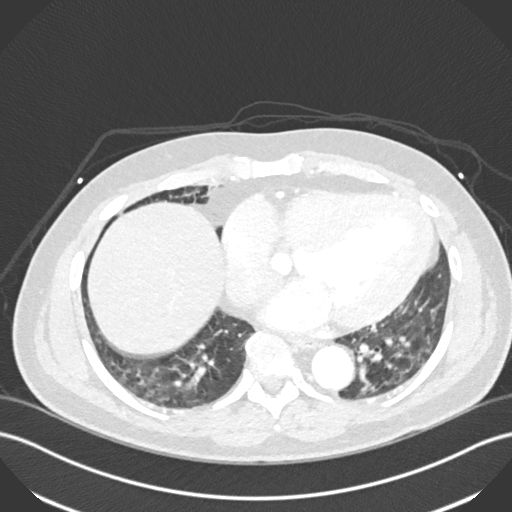
[im 188/407  mediastinal]
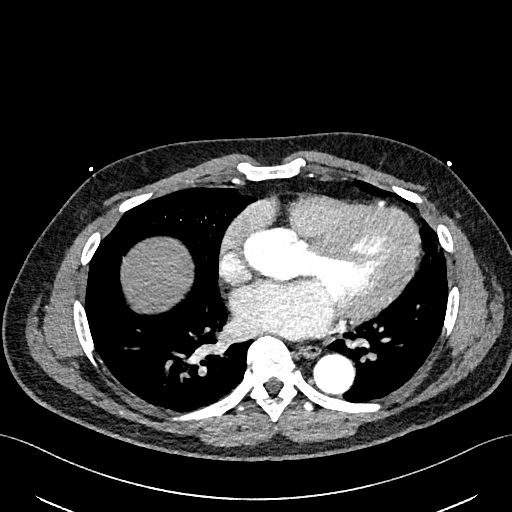
[im 219/407  lung]
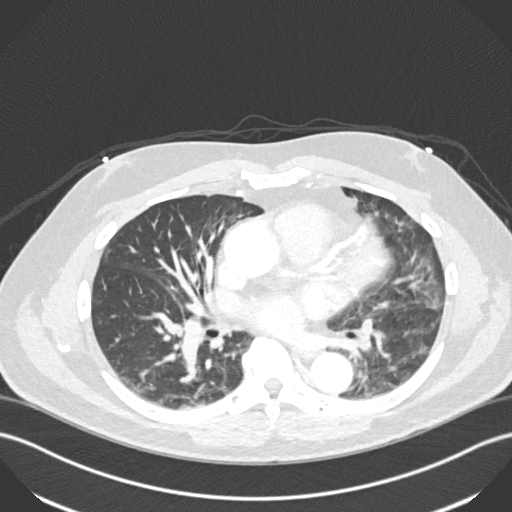
[im 250/407  mediastinal]
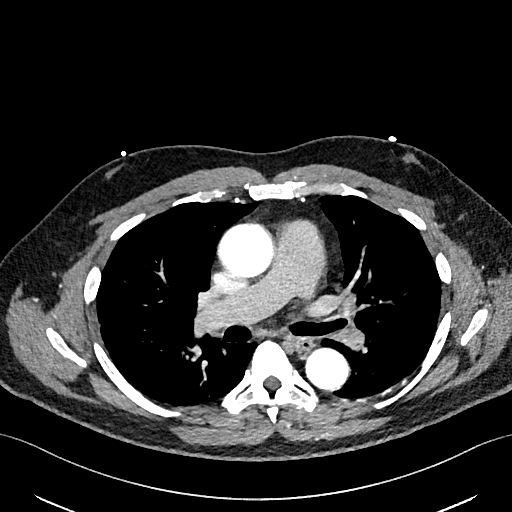
[im 282/407  lung]
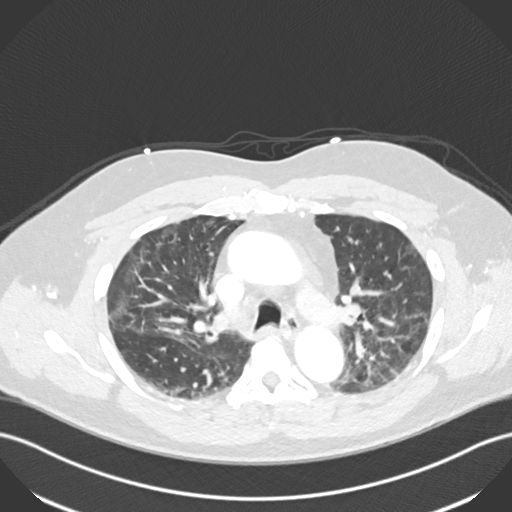
[im 313/407  mediastinal]
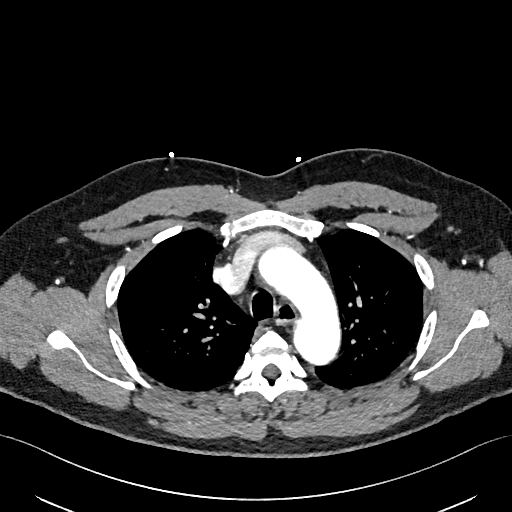
[im 344/407  lung]
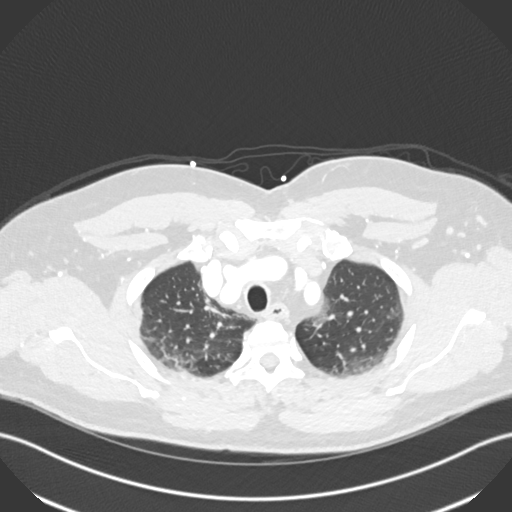
[im 375/407  mediastinal]
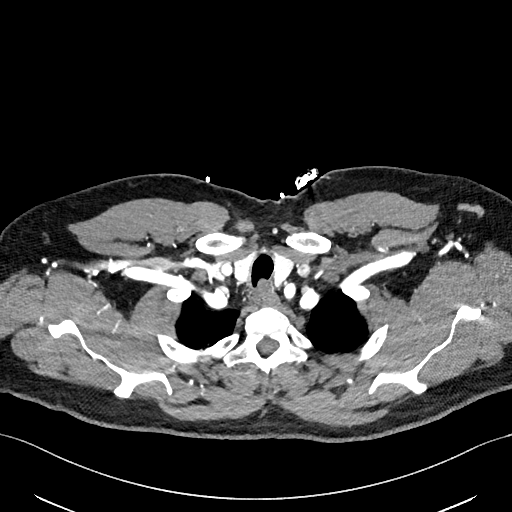

[Series 16: cor · coronal · 0.54mm/px · 3 of 141 slices shown]
[im 29/141  mediastinal]
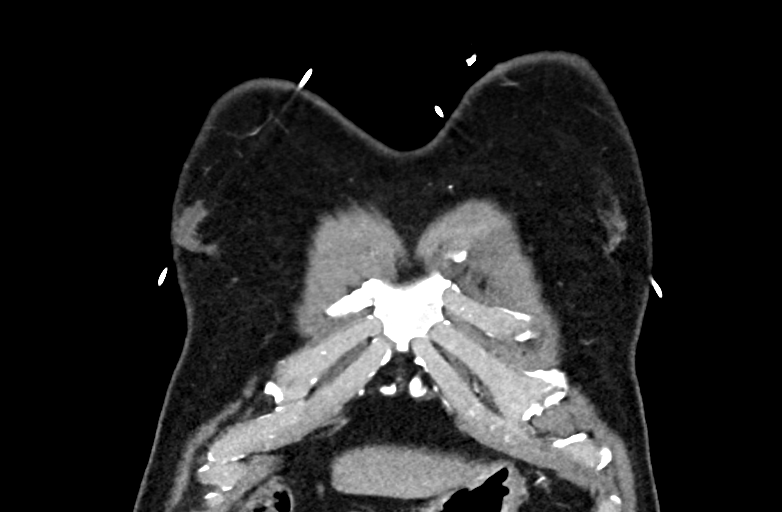
[im 57/141  mediastinal]
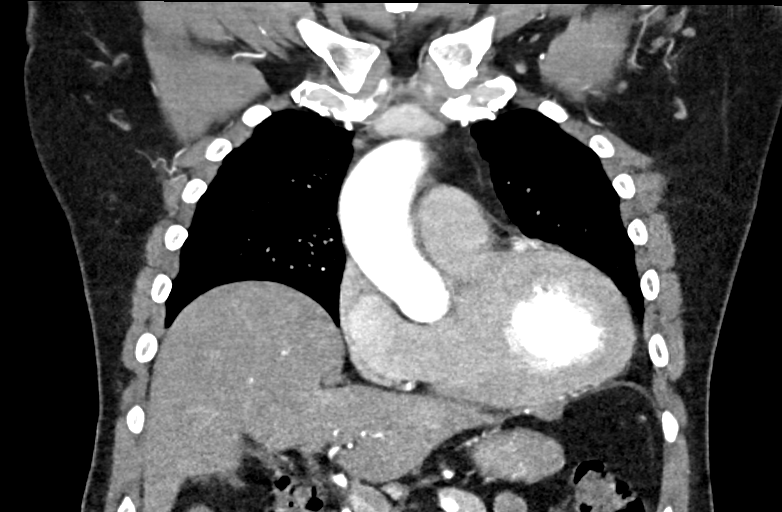
[im 85/141  mediastinal]
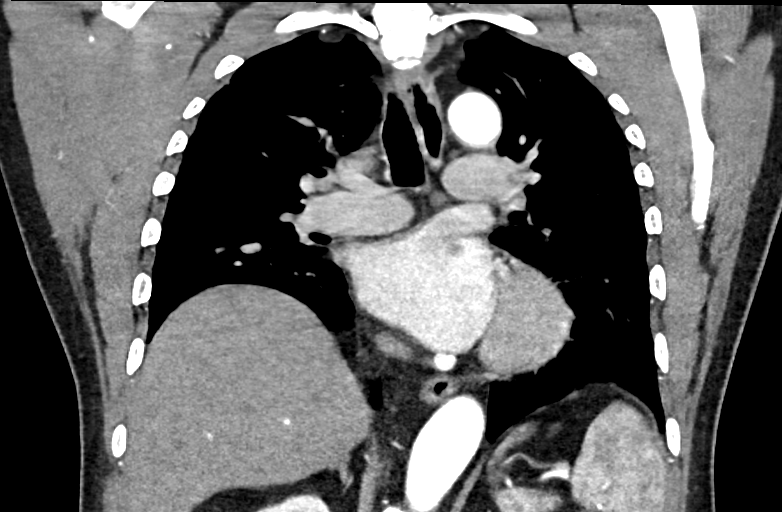

[15 of 36 positions shown; findings below may reference images not displayed]

FINDINGS: Cardiovascular: Heart size is borderline enlarged with some
concentric left ventricular hypertrophy. There is no significant
pericardial fluid, thickening or pericardial calcification. There is
aortic atherosclerosis, as well as atherosclerosis of the great
vessels of the mediastinum and the coronary arteries, including
calcified atherosclerotic plaque in the left anterior descending
coronary artery. Ectasia of ascending thoracic aorta (4.0 cm in
diameter).

Mediastinum/Nodes: No pathologically enlarged mediastinal or hilar
lymph nodes. Esophagus is unremarkable in appearance. No axillary
lymphadenopathy.

Lungs/Pleura: Patchy areas of ground-glass attenuation and mild
interlobular septal thickening noted in the lungs bilaterally,
nonspecific. No confluent consolidative airspace disease. No pleural
effusions. A few scattered tiny 1-3 mm pulmonary nodules are noted
in the lungs, nonspecific, but statistically likely benign. No other
larger more suspicious appearing pulmonary nodules or masses are
noted.

Upper Abdomen: Exophytic low-attenuation lesion in the upper pole of
the right kidney measuring 2.7 cm in diameter, compatible with a
simple cyst.

Musculoskeletal: There are no aggressive appearing lytic or blastic
lesions noted in the visualized portions of the skeleton.

Review of the MIP images confirms the above findings.
IMPRESSION: 1. Aortic atherosclerosis with ectasia of the ascending thoracic
aorta (4.0 cm in diameter). Recommend annual imaging followup by CTA
or MRA. This recommendation follows 9444
ACCF/AHA/AATS/ACR/ASA/SCA/RUS/RUTTAR/NUA/REBEL Guidelines for the
Diagnosis and Management of Patients with Thoracic Aortic Disease.
Circulation. 9444; 121: E266-e369. Aortic aneurysm NOS
(T8Z7H-2F1.6).
2. Borderline cardiomegaly with concentric left ventricular
hypertrophy.
3. Patchy areas of ground-glass attenuation and interlobular septal
thickening in the lungs bilaterally, nonspecific. This could
potentially reflect a background of mild interstitial pulmonary
edema, or could be indicative of early interstitial lung disease.
4. Multiple tiny 1-3 mm pulmonary nodules scattered throughout the
lungs bilaterally, nonspecific, but statistically likely benign. No
follow-up needed if patient is low-risk (and has no known or
suspected primary neoplasm). Non-contrast chest CT can be considered
in 12 months if patient is high-risk. This recommendation follows
the consensus statement: Guidelines for Management of Incidental
Pulmonary Nodules Detected on CT Images: From the [HOSPITAL]

Aortic Atherosclerosis (T8Z7H-QYS.S).

## 2021-03-11 ENCOUNTER — Telehealth: Payer: Self-pay | Admitting: Internal Medicine

## 2021-03-11 NOTE — Telephone Encounter (Signed)
Received a new hem referral from [facility] for anemia. Eric [name] has been cld and scheduled to see Dr. Arbutus Ped on 4/27 at 11:45am w/labs at 11:15am. Pt aware to arrive 15 minutes early.

## 2021-03-12 ENCOUNTER — Telehealth: Payer: Self-pay

## 2021-03-12 NOTE — Telephone Encounter (Signed)
   [place] Pre-operative Risk Assessment    Patient Name: Eric [name]  DOB: 10-03-1956  MRN: [id number]   HEARTCARE STAFF: - Please ensure there is not already an duplicate clearance open for this procedure. - Under Visit Info/Reason for Call, type in Other and utilize the format Clearance MM/DD/YY or Clearance TBD. Do not use dashes or single digits. - If request is for dental extraction, please clarify the # of teeth to be extracted.  Request for surgical clearance:  1. What type of surgery is being performed? Colonoscopy/Endoscopy   2. When is this surgery scheduled? 06/01/21  3. What type of clearance is required (medical clearance vs. Pharmacy clearance to hold med vs. Both)? Both   4. Are there any medications that need to be held prior to surgery and how long? ASA  5. Practice name and name of physician performing surgery? [facility], Dr. Michail Sermon  6. What is the office phone number? [phone]   7.   What is the office fax number? [phone]  8.   Anesthesia type (None, local, MAC, general) ? Propofol    Mendel Ryder 03/12/2021, 5:04 PM  _________________________________________________________________   (provider comments below)

## 2021-03-16 NOTE — Telephone Encounter (Signed)
   Name: Eric [name]  DOB: 1956/11/11  MRN: [id number]   Primary Cardiologist: Lesleigh Noe, MD  Chart reviewed as part of pre-operative protocol coverage. Patient was contacted 03/16/2021 in reference to pre-operative risk assessment for pending surgery as outlined below.  Eric [name] was last seen on 02/01/2021 by Dr. Katrinka Blazing.  Since that day, Eric [name] has done well without any exertional chest discomfort or worsening dyspnea.  Therefore, based on ACC/AHA guidelines, the patient would be at acceptable risk for the planned procedure without further cardiovascular testing.   The patient was advised that if he develops new symptoms prior to surgery to contact our office to arrange for a follow-up visit, and he verbalized understanding.  Dr. Katrinka Blazing, patient recently underwent aortic valve replacement in January 2022.  Prior to that, patient had coronary CT on 10/23/2020 that showed minimal CAD.  He would be at least 6 months out from the aortic valve replacement by the time he has the GI procedure, would you be okay with him holding the aspirin for 5 to 7 days prior to the procedure and restart as soon as possible afterward at the surgeon's discretion?  Please forward her response to P CV DIV PREOP  Azalee Course, PA 03/16/2021, 10:51 AM

## 2021-03-16 NOTE — Telephone Encounter (Signed)
Okay to hold aspirin 5-7 days prior to procedure.

## 2021-03-17 ENCOUNTER — Inpatient Hospital Stay: Payer: Managed Care, Other (non HMO) | Attending: Internal Medicine | Admitting: Internal Medicine

## 2021-03-17 ENCOUNTER — Encounter: Payer: Self-pay | Admitting: Internal Medicine

## 2021-03-17 ENCOUNTER — Other Ambulatory Visit: Payer: Self-pay

## 2021-03-17 ENCOUNTER — Inpatient Hospital Stay: Payer: Managed Care, Other (non HMO)

## 2021-03-17 VITALS — BP 123/83 | HR 71 | Temp 97.5°F | Resp 20 | Ht 70.0 in | Wt 219.9 lb

## 2021-03-17 DIAGNOSIS — Z79899 Other long term (current) drug therapy: Secondary | ICD-10-CM | POA: Diagnosis not present

## 2021-03-17 DIAGNOSIS — K219 Gastro-esophageal reflux disease without esophagitis: Secondary | ICD-10-CM | POA: Diagnosis not present

## 2021-03-17 DIAGNOSIS — Z8616 Personal history of COVID-19: Secondary | ICD-10-CM | POA: Insufficient documentation

## 2021-03-17 DIAGNOSIS — R011 Cardiac murmur, unspecified: Secondary | ICD-10-CM | POA: Insufficient documentation

## 2021-03-17 DIAGNOSIS — D509 Iron deficiency anemia, unspecified: Secondary | ICD-10-CM | POA: Insufficient documentation

## 2021-03-17 DIAGNOSIS — Z7982 Long term (current) use of aspirin: Secondary | ICD-10-CM | POA: Insufficient documentation

## 2021-03-17 DIAGNOSIS — D539 Nutritional anemia, unspecified: Secondary | ICD-10-CM | POA: Diagnosis not present

## 2021-03-17 DIAGNOSIS — N189 Chronic kidney disease, unspecified: Secondary | ICD-10-CM | POA: Diagnosis not present

## 2021-03-17 DIAGNOSIS — I10 Essential (primary) hypertension: Secondary | ICD-10-CM | POA: Diagnosis not present

## 2021-03-17 DIAGNOSIS — Z800 Family history of malignant neoplasm of digestive organs: Secondary | ICD-10-CM | POA: Diagnosis not present

## 2021-03-17 LAB — CBC WITH DIFFERENTIAL (CANCER CENTER ONLY)
Abs Immature Granulocytes: 0.01 10*3/uL (ref 0.00–0.07)
Basophils Absolute: 0.1 10*3/uL (ref 0.0–0.1)
Basophils Relative: 1 %
Eosinophils Absolute: 0.3 10*3/uL (ref 0.0–0.5)
Eosinophils Relative: 6 %
HCT: 37.3 % — ABNORMAL LOW (ref 39.0–52.0)
Hemoglobin: 12.7 g/dL — ABNORMAL LOW (ref 13.0–17.0)
Immature Granulocytes: 0 %
Lymphocytes Relative: 29 %
Lymphs Abs: 1.7 10*3/uL (ref 0.7–4.0)
MCH: 23.9 pg — ABNORMAL LOW (ref 26.0–34.0)
MCHC: 34 g/dL (ref 30.0–36.0)
MCV: 70.1 fL — ABNORMAL LOW (ref 80.0–100.0)
Monocytes Absolute: 0.6 10*3/uL (ref 0.1–1.0)
Monocytes Relative: 10 %
Neutro Abs: 3.2 10*3/uL (ref 1.7–7.7)
Neutrophils Relative %: 54 %
Platelet Count: 245 10*3/uL (ref 150–400)
RBC: 5.32 MIL/uL (ref 4.22–5.81)
RDW: 17.6 % — ABNORMAL HIGH (ref 11.5–15.5)
WBC Count: 5.9 10*3/uL (ref 4.0–10.5)
nRBC: 0 % (ref 0.0–0.2)

## 2021-03-17 LAB — IRON AND TIBC
Iron: 130 ug/dL (ref 42–163)
Saturation Ratios: 41 % (ref 20–55)
TIBC: 319 ug/dL (ref 202–409)
UIBC: 188 ug/dL (ref 117–376)

## 2021-03-17 LAB — FERRITIN: Ferritin: 261 ng/mL (ref 24–336)

## 2021-03-17 NOTE — Progress Notes (Signed)
[facility] [phone]   [phone]  CONSULT NOTE  REFERRING PHYSICIAN: Dr. Charlott Rakes  REASON FOR CONSULTATION:  65 years old African-American male with iron deficiency anemia.  HPI Eric [name] is a 65 y.o. male with past medical history significant for hypertension, pneumonia, chronic kidney disease, seasonal allergy as well as aortic valve replacement.  The patient mentioned that a couple of months ago he was admitted to the hospital with pneumonia and during his hospitalization he was found to have significant anemia.  His hemoglobin was down to 9.5.  His ferritin level was normal at 238 but serum iron was low at 13 and iron saturation was 6%.  Stool cards for Hemoccult were negative.  The patient was referred to Dr. Lavonia Drafts for evaluation.  His last colonoscopy was in 2019 and was unremarkable.  He is scheduled to have another colonoscopy on June 11, 2021.  He was seen by Dr. Marge Duncans assistant Quentin Mulling and repeat blood work showed persistent anemia.  He started oral iron tablet recently and tolerating it fairly well.  He was referred to [place] for further evaluation and recommendation regarding his condition. When seen today the patient is feeling fine with no concerning complaints.  He denied having any fatigue or weakness he denied having any dizzy spells.  He has no chest pain, shortness of breath, cough or hemoptysis.  He has no nausea, vomiting, diarrhea or constipation.  He has no recent weight loss or night sweats. Family history significant for father with stomach cancer.  Mother had hypertension and brother had colon cancer at age 14. The patient is married and has 3 children.  He works as Optometrist.  He has no history of smoking and drinks alcohol occasionally and no history of drug abuse.  HPI  Past Medical History:  Diagnosis Date  . Allergy    ALLERGIC RHINITIS..WORSE SPRING/FALL  . Aortic valve  regurgitation 07/10/14   ECHO @ [facility]  . Arthritis   . Chronic kidney disease    Stage 3 , followed by [facility]  . GERD (gastroesophageal reflux disease)    CURRENTLY DIET CONTROLLED  . Heart murmur   . Hypertension   . Pneumonia 06/2019  . Sinusitis, chronic     Past Surgical History:  Procedure Laterality Date  . AORTIC VALVE REPLACEMENT N/A 11/23/2020   Procedure: AORTIC VALVE REPLACEMENT (AVR) USING EDWARDS INSPIRIS RESILIA 25 MM AORTIC VALVE;  Surgeon: Alleen Borne, MD;  Location: [facility] OR;  Service: Open Heart Surgery;  Laterality: N/A;  . COLONOSCOPY    . KNEE ARTHROSCOPY Bilateral   . LASIK    . TEE WITHOUT CARDIOVERSION N/A 11/23/2020   Procedure: TRANSESOPHAGEAL ECHOCARDIOGRAM (TEE);  Surgeon: Alleen Borne, MD;  Location: [facility] OR;  Service: Open Heart Surgery;  Laterality: N/A;    Family History  Problem Relation Age of Onset  . Hypertension Mother   . Cancer Father        STOMACH  . Diabetes Daughter   . Cancer Maternal Grandmother        STOMACH  . Heart disease Maternal Grandmother   . Cancer Brother        COLON  . Diabetes Brother     Social History Social History   Tobacco Use  . Smoking status: Never Smoker  . Smokeless tobacco: Never Used  Vaping Use  . Vaping Use: Never used  Substance Use Topics  . Alcohol use: Yes  Alcohol/week: 2.0 standard drinks    Types: 2 Standard drinks or equivalent per week    Comment: occasional  . Drug use: No    Allergies  Allergen Reactions  . Amlodipine Cough    Current Outpatient Medications  Medication Sig Dispense Refill  . acetaminophen (TYLENOL) 500 MG tablet Take 1,000 mg by mouth every 6 (six) hours as needed (pain).    Marland Kitchen allopurinol (ZYLOPRIM) 300 MG tablet Take 300 mg by mouth daily.  4  . amoxicillin (AMOXIL) 500 MG capsule Take 4 tablets (2000mg  total) by mouth 1 hour prior to dental work 4 capsule 3  . aspirin EC 325 MG EC tablet Take 1 tablet (325 mg total) by mouth daily.     . carvedilol (COREG) 6.25 MG tablet Take 1 tablet (6.25 mg total) by mouth 2 (two) times daily. 180 tablet 3  . ferrous sulfate 325 (65 FE) MG tablet Take 325 mg by mouth daily.    . Menthol, Topical Analgesic, (ICY HOT EX) Apply 1 application topically daily as needed (pain).    . polyethylene glycol (MIRALAX / GLYCOLAX) 17 g packet Take 17 g by mouth daily as needed for mild constipation. 14 each 0  . rosuvastatin (CRESTOR) 10 MG tablet Take 1 tablet (10 mg total) by mouth daily. 90 tablet 3  . sacubitril-valsartan (ENTRESTO) 24-26 MG Take 1 tablet by mouth 2 (two) times daily. 60 tablet 11   No current facility-administered medications for this visit.    Review of Systems  Constitutional: negative Eyes: negative Ears, nose, mouth, throat, and face: negative Respiratory: negative Cardiovascular: negative Gastrointestinal: negative Genitourinary:negative Integument/breast: negative Hematologic/lymphatic: negative Musculoskeletal:negative Neurological: negative Behavioral/Psych: negative Endocrine: negative Allergic/Immunologic: negative  Physical Exam  , healthy, no distress, well nourished and well developed SKIN: skin color, texture, turgor are normal, no rashes or significant lesions HEAD: Normocephalic, No masses, lesions, tenderness or abnormalities EYES: normal, PERRLA, Conjunctiva are pink and non-injected EARS: External ears normal, Canals clear OROPHARYNX:no exudate, no erythema and lips, buccal mucosa, and tongue normal  NECK: supple, no adenopathy, no JVD LYMPH:  no palpable lymphadenopathy, no hepatosplenomegaly LUNGS: clear to auscultation , and palpation HEART: regular rate & rhythm, no murmurs and no gallops ABDOMEN:abdomen soft, non-tender, normal bowel sounds and no masses or organomegaly BACK: No CVA tenderness, Range of motion is normal EXTREMITIES:no joint deformities, effusion, or inflammation, no edema  NEURO: alert & oriented x 3 with  fluent speech, no focal motor/sensory deficits  PERFORMANCE STATUS: ECOG 0  LABORATORY DATA: Lab Results  Component Value Date   WBC 7.3 01/23/2021   HGB 10.4 (L) 01/24/2021   HCT 29.3 (L) 01/24/2021   MCV 70.7 (L) 01/23/2021   PLT 236 01/23/2021      Chemistry      Component Value Date/Time   NA 138 02/12/2021 1014   K 4.8 02/12/2021 1014   CL 99 02/12/2021 1014   CO2 21 02/12/2021 1014   BUN 18 02/12/2021 1014   CREATININE 1.62 (H) 02/12/2021 1014   CREATININE 1.85 (H) 10/22/2020 1530      Component Value Date/Time   CALCIUM 9.7 02/12/2021 1014   ALKPHOS 71 11/19/2020 1455   AST 28 11/19/2020 1455   ALT 28 11/19/2020 1455   BILITOT 1.0 11/19/2020 1455   BILITOT 0.8 05/15/2020 0852       RADIOGRAPHIC STUDIES: DG Chest 2 View  Result Date: 02/19/2021 CLINICAL DATA:  Recent aspiration pneumonitis EXAM: CHEST - 2 VIEW COMPARISON:  Chest radiograph and chest  CT January 22, 2021 FINDINGS: There is mild left base atelectasis. Lungs elsewhere are clear. Heart size is upper normal with evidence of previous aortic valve replacement. Pulmonary vascularity is normal. No adenopathy. No bone lesions. IMPRESSION: Mild left base atelectasis. Lungs otherwise clear. Heart upper normal in size. Status [name] aortic valve replacement. Electronically Signed   By: Bretta Bang III M.D.   On: 02/19/2021 08:18    ASSESSMENT: This is a very pleasant 65 years old African-American male seen for evaluation of persistent iron deficiency anemia.  The patient has a strong family history of stomach cancer as well as colon cancer.  He is expected to have repeat colonoscopy and upper endoscopy on June 11, 2021 under the care of Dr. Lavonia Drafts.   PLAN: I had a lengthy discussion with the patient today about his current condition and treatment options.  His hemoglobin and hematocrit are much better on today's lab work.  His hemoglobin is 12.7 and hematocrit 37.3% with MCV of 70.1. The patient denied having  any family history of sickle cell or other hemoglobinopathy in the family. Is tolerating the oral iron tablet fairly well.  Repeat iron study today showed serum iron of 130 with iron saturation of 41%.  Ferritin level 261. I did not see any need to give this patient any iron infusion at this point. I recommended for him to continue on the oral iron tablets. I will see him back for follow-up visit in 3 months for evaluation with repeat CBC, iron study and ferritin. I also order serum protein electrophoresis with immunofixation to rule out any underlying monoclonal gammopathy. The patient was advised to call immediately if he has any other concerning symptoms in the interval. The patient voices understanding of current disease status and treatment options and is in agreement with the current care plan.  All questions were answered. The patient knows to call the clinic with any problems, questions or concerns. We can certainly see the patient much sooner if necessary.  Thank you so much for allowing me to participate in the care of Eric [name]. I will continue to follow up the patient with you and assist in his care.  The total time spent in the appointment was 60 minutes.  Disclaimer: This note was dictated with voice recognition software. Similar sounding words can inadvertently be transcribed and may not be corrected upon review.   Lajuana Matte March 17, 2021, 12:44 PM

## 2021-03-18 ENCOUNTER — Telehealth: Payer: Self-pay | Admitting: Internal Medicine

## 2021-03-18 NOTE — Telephone Encounter (Signed)
Scheduled per los. Called and spoke with patient. Confirmed appt 

## 2021-03-19 LAB — PROTEIN ELECTROPHORESIS, SERUM, WITH REFLEX
A/G Ratio: 1.2 (ref 0.7–1.7)
Albumin ELP: 3.8 g/dL (ref 2.9–4.4)
Alpha-1-Globulin: 0.2 g/dL (ref 0.0–0.4)
Alpha-2-Globulin: 0.7 g/dL (ref 0.4–1.0)
Beta Globulin: 1.1 g/dL (ref 0.7–1.3)
Gamma Globulin: 1.2 g/dL (ref 0.4–1.8)
Globulin, Total: 3.3 g/dL (ref 2.2–3.9)
Total Protein ELP: 7.1 g/dL (ref 6.0–8.5)

## 2021-03-25 IMAGING — DX DG CHEST 1V PORT
1 series · 1 of 1 positions shown · non-contrast
Comparison: 11/23/2020.

CLINICAL DATA: Chest tube.  AVR.

EXAM:
PORTABLE CHEST 1 VIEW

[chest ap]
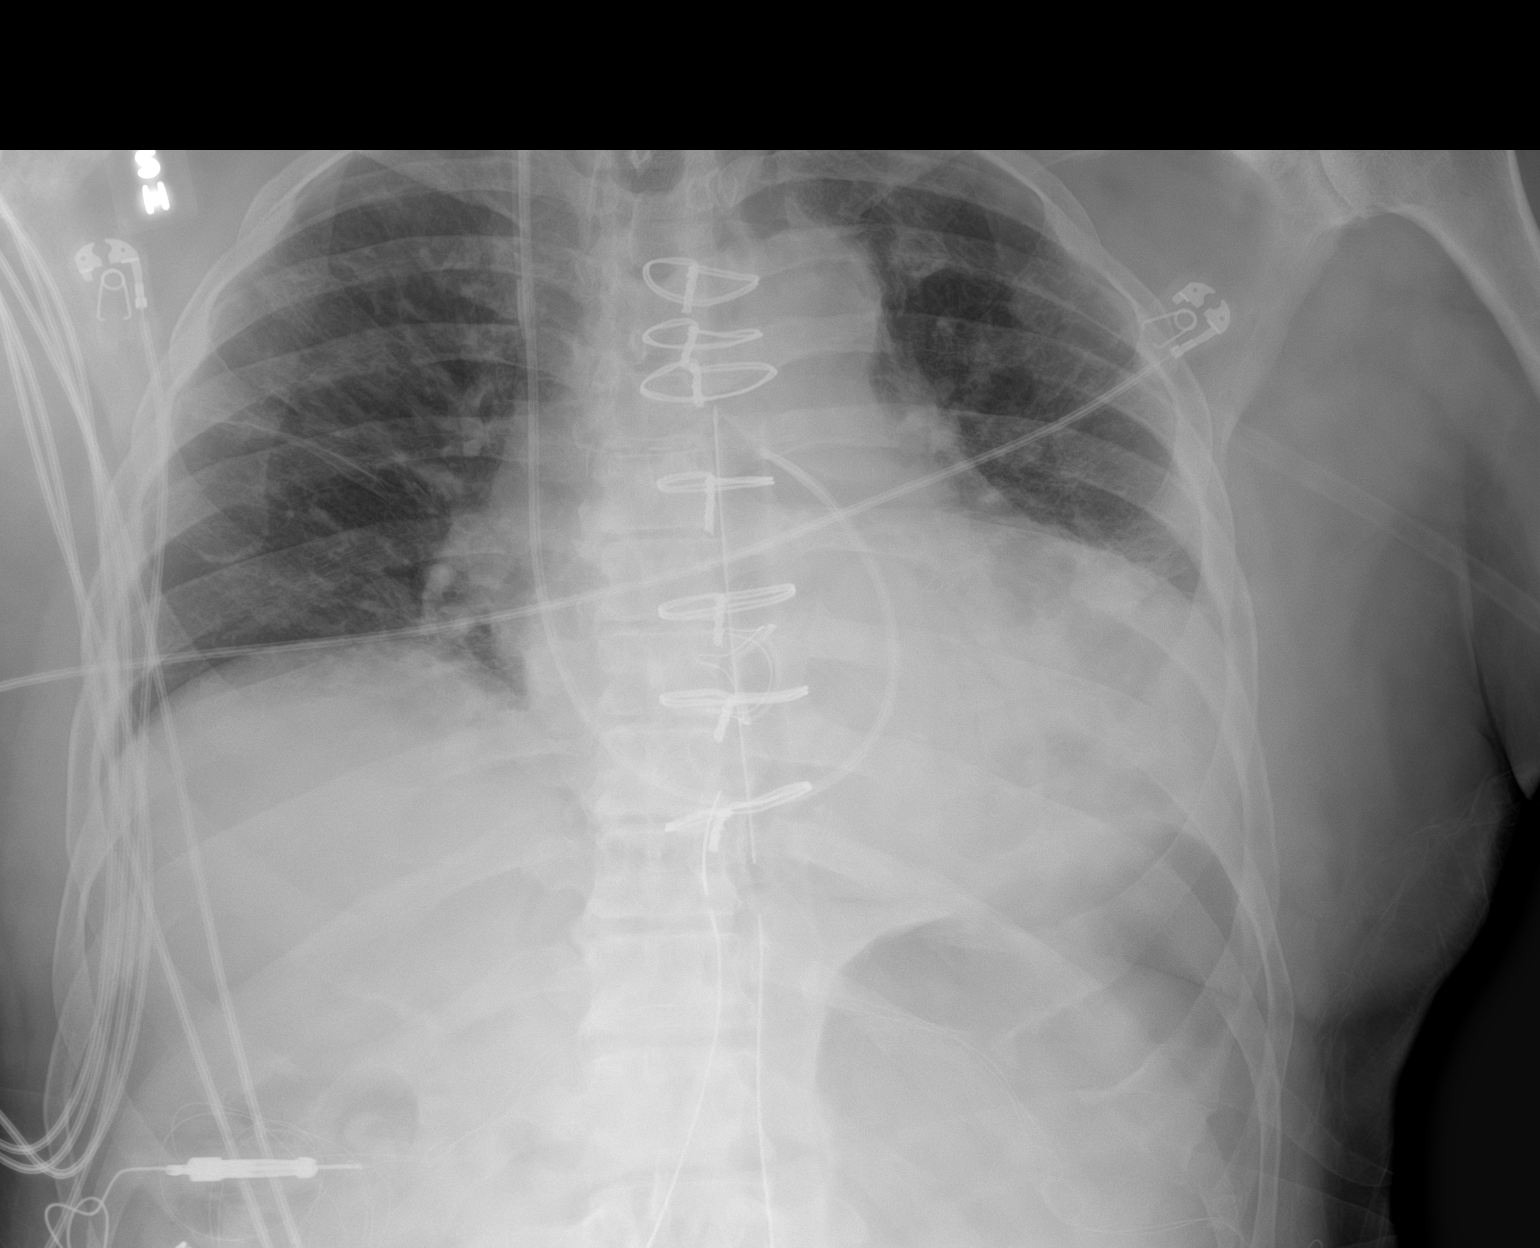

[1 of 1 positions shown; findings below may reference images not displayed]

FINDINGS: Endotracheal tube is been removed. Swan-Ganz catheter, mediastinal
drainage catheters in stable position. Prior cardiac valve
replacement. Stable cardiomegaly. Low lung volumes with bilateral
atelectatic changes. Atelectasis particularly prominent in the left
lung base. Left base infiltrate cannot be excluded. Tiny left
pleural effusion cannot be excluded. No pneumothorax. Mild gastric
distention cannot be excluded.
IMPRESSION: 1. Endotracheal tube removed. Swan-Ganz catheter, mediastinal
drainage catheters in stable position.
2. Low lung volumes with bilateral atelectatic changes. Atelectasis
particularly prominent and the left lung base. Left base infiltrate
cannot be excluded. Tiny left pleural effusion cannot be excluded.
3.  Mild gastric distention cannot be excluded.

## 2021-03-26 IMAGING — DX DG CHEST 1V PORT
1 series · 1 of 1 positions shown · non-contrast
Comparison: 11/24/2020

CLINICAL DATA: Status post aortic valve replacement

EXAM:
PORTABLE CHEST 1 VIEW

[chest ap]
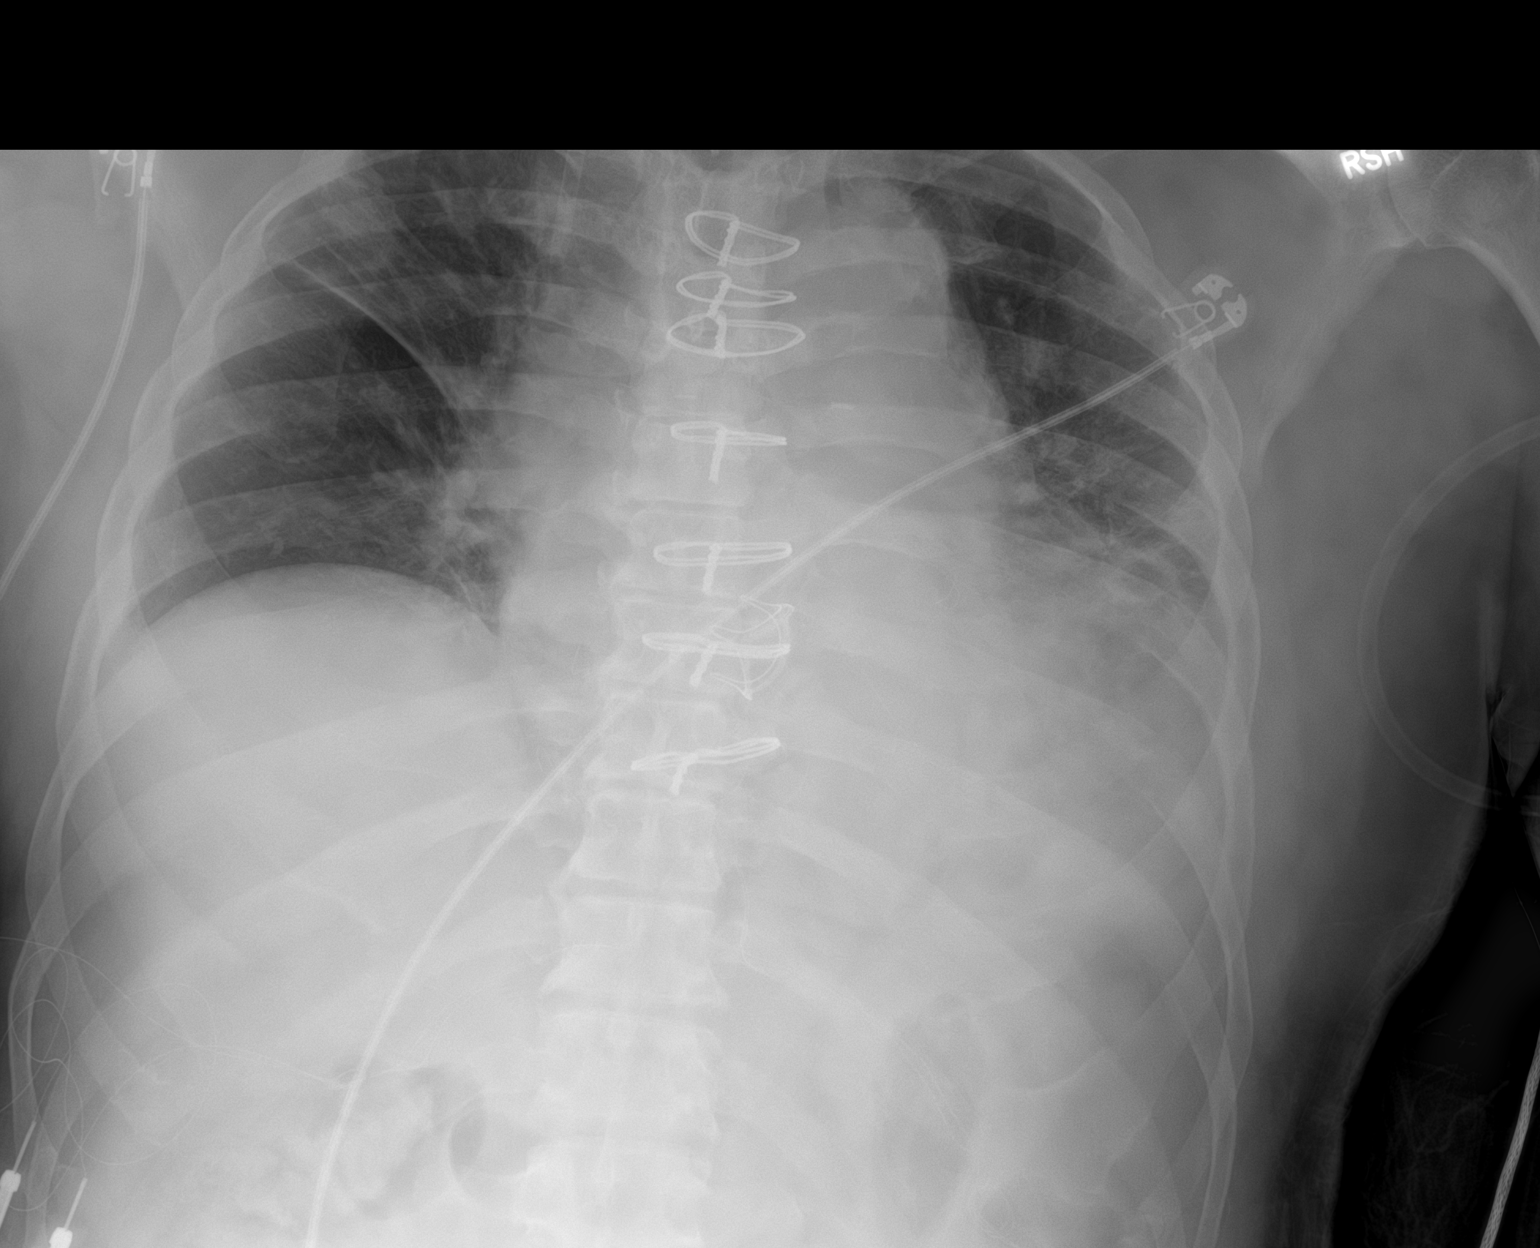

[1 of 1 positions shown; findings below may reference images not displayed]

FINDINGS: Lung volumes are small with elevation of the right hemidiaphragm
again noted. Pulmonary insufflation, however, appears stable.
Discoid atelectasis within the right upper lung zone. No
pneumothorax. Small left pleural effusion may be present, unchanged.

Cordis introducer is unchanged overlying the expected innominate
vein. Swan-Ganz catheter, however, has been removed. Both
mediastinal drains have been removed. Surgical changes of aortic
valve replacement are again identified. Moderate cardiomegaly is
stable.
IMPRESSION: Stable pulmonary hypoinflation and elevation of the right
hemidiaphragm.

Probable small left pleural effusion.

Stable moderate cardiomegaly.

## 2021-04-12 ENCOUNTER — Ambulatory Visit: Payer: Managed Care, Other (non HMO) | Admitting: Interventional Cardiology

## 2021-04-23 IMAGING — DX DG CHEST 2V
2 series · 2 of 2 positions shown · non-contrast
Comparison: 11/28/2020

CLINICAL DATA: Postop aortic valve replacement surgery.

EXAM:
CHEST - 2 VIEW

[dg chest 2 view (1 of 2)]
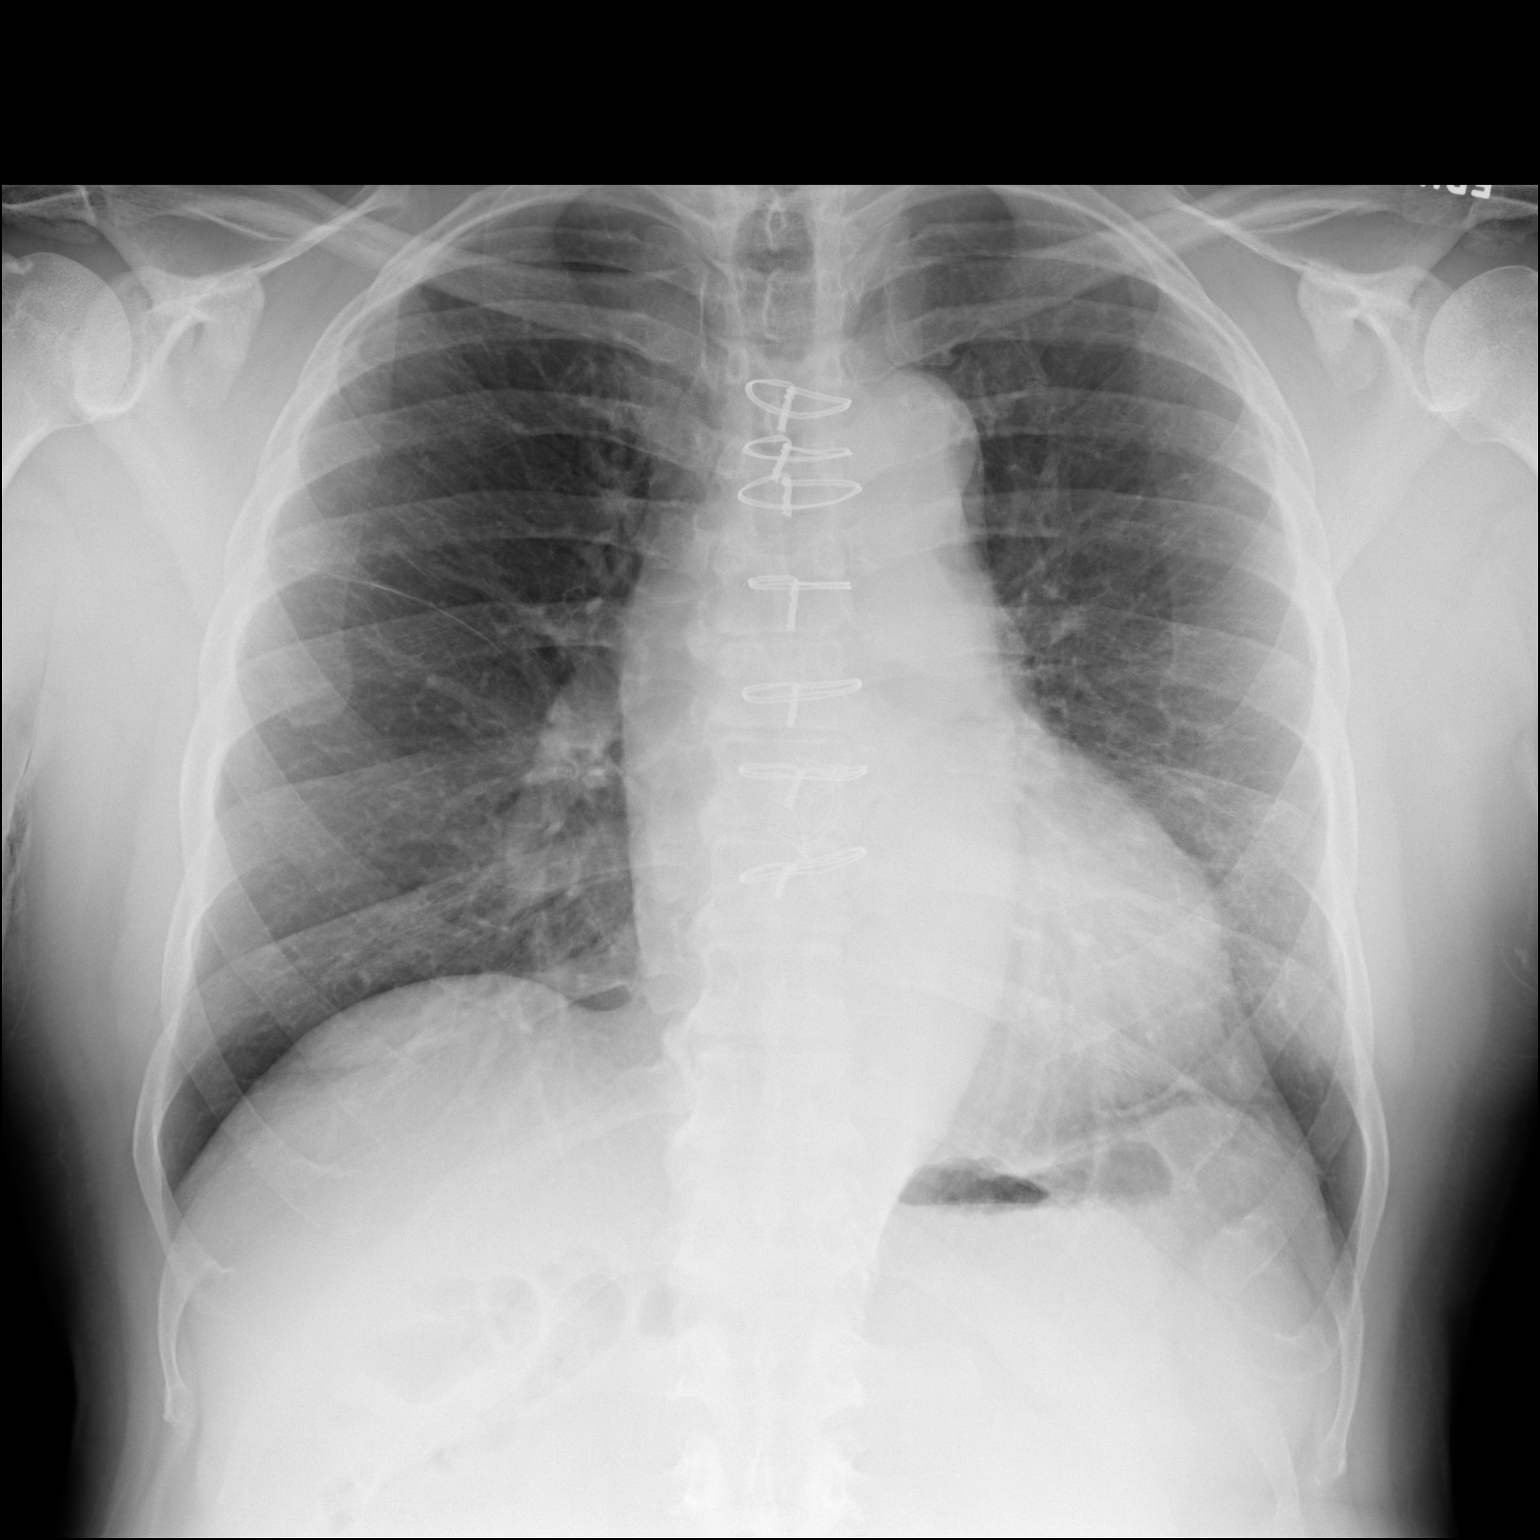

[dg chest 2 view (2 of 2)]
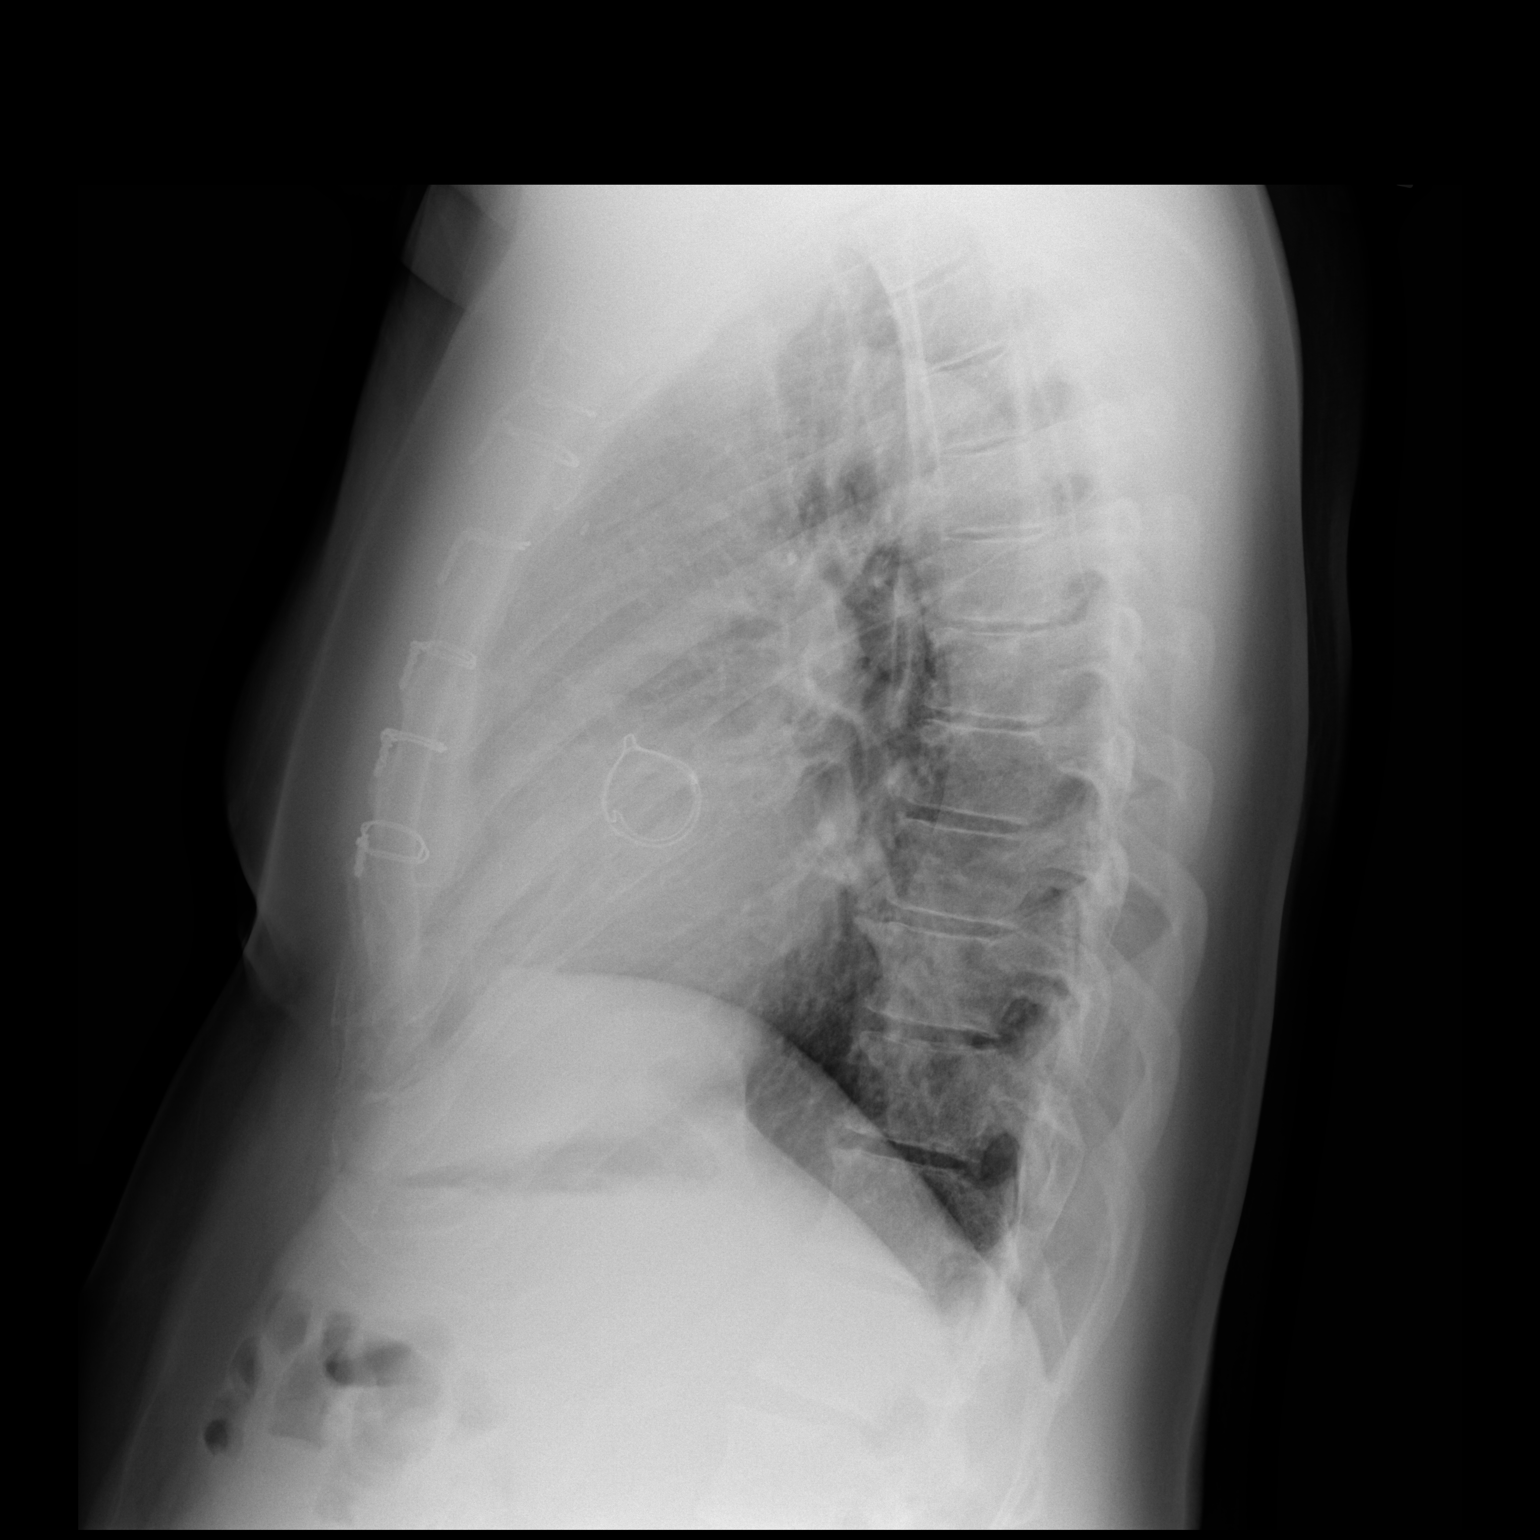

[2 of 2 positions shown; findings below may reference images not displayed]

FINDINGS: Stable postoperative changes.

The cardiac silhouette, mediastinal and hilar contours are within
normal limits and stable. Stable mild tortuosity of the thoracic
aorta.

The lungs are clear. No pulmonary edema, pleural effusions or
atelectasis. No pneumothorax. The bony thorax is intact.
IMPRESSION: No acute cardiopulmonary findings.

## 2021-04-29 NOTE — Progress Notes (Signed)
Cardiology Office Note:    Date:  04/30/2021   ID:  [name], [name] Oct 09, 1956, MRN [id number]  PCP:  Elias Else, MD  Cardiologist:  Lesleigh Noe, MD   Referring MD: Elias Else, MD   Chief Complaint  Patient presents with   Cardiac Valve Problem    History of Present Illness:    Eric [name] is a 65 y.o. male with a hx of severe aortic regurgitation, s/p AVR 11/25/2020,  hypertension, left ventricular hypertrophy, chronic kidney disease stage III, Covid 19 infection (September 20200, and recent admission for bilateral CAP vs post-cardiotomy syndrome 01/22/2021.  He feels great no.  He is discouraged that he is on medication now was not on any before he started seeing me.  He had hoped that having the valve replaced would "cure".  In his mind he felt that that meant medications would not be required.  We talked about risk prevention going forward relative to LV function (Entresto and carvedilol), ischemic disease related to downstream effect of hyperlipidemia (Crestor), and bioprosthetic valve leaflet thrombosis that can occur in the first 3 to 6 months after valve replacement.  We had quite a conversation.  Reviewed prior data.  We talked about EF being 40 to 45% after surgery as opposed to 55 to 60% as it was prior to surgery.  The Entresto and carvedilol was started after noted the decrease in LV function.  He wants to be off as many medications as possible.  He understands the potential for decreased LV function on no therapy for systolic dysfunction.  He wants to try off of all medications and see how he does with reevaluation in 3 months.  Past Medical History:  Diagnosis Date   Allergy    ALLERGIC RHINITIS..WORSE SPRING/FALL   Aortic valve regurgitation 07/10/14   ECHO @ [facility]   Arthritis    Chronic kidney disease    Stage 3 , followed by [facility]   GERD (gastroesophageal reflux disease)    CURRENTLY DIET CONTROLLED   Heart murmur    Hypertension     Pneumonia 06/2019   Sinusitis, chronic     Past Surgical History:  Procedure Laterality Date   AORTIC VALVE REPLACEMENT N/A 11/23/2020   Procedure: AORTIC VALVE REPLACEMENT (AVR) USING EDWARDS INSPIRIS RESILIA 25 MM AORTIC VALVE;  Surgeon: Alleen Borne, MD;  Location: [facility] OR;  Service: Open Heart Surgery;  Laterality: N/A;   COLONOSCOPY     KNEE ARTHROSCOPY Bilateral    LASIK     TEE WITHOUT CARDIOVERSION N/A 11/23/2020   Procedure: TRANSESOPHAGEAL ECHOCARDIOGRAM (TEE);  Surgeon: Alleen Borne, MD;  Location: [facility] OR;  Service: Open Heart Surgery;  Laterality: N/A;    Current Medications: Current Meds  Medication Sig   acetaminophen (TYLENOL) 500 MG tablet Take 1,000 mg by mouth every 6 (six) hours as needed (pain).   allopurinol (ZYLOPRIM) 300 MG tablet Take 300 mg by mouth daily.   aspirin 325 MG tablet Take 325 mg by mouth daily.   carvedilol (COREG) 6.25 MG tablet Take 1 tablet (6.25 mg total) by mouth 2 (two) times daily.   ferrous sulfate 325 (65 FE) MG tablet Take 325 mg by mouth daily.   [DISCONTINUED] rosuvastatin (CRESTOR) 10 MG tablet Take 1 tablet (10 mg total) by mouth daily.   [DISCONTINUED] sacubitril-valsartan (ENTRESTO) 24-26 MG Take 1 tablet by mouth 2 (two) times daily.     Allergies:   Amlodipine   Social History  Socioeconomic History   Marital status: Married    Spouse name: [name]   Number of children: 3   Years of education: 18   Highest education level: Master's degree (e.g., MA, MS, MEng, MEd, MSW, MBA)  Occupational History   Occupation: Art gallery manager  Tobacco Use   Smoking status: Never   Smokeless tobacco: Never  Vaping Use   Vaping Use: Never used  Substance and Sexual Activity   Alcohol use: Yes    Alcohol/week: 2.0 standard drinks    Types: 2 Standard drinks or equivalent per week    Comment: occasional   Drug use: No   Sexual activity: Yes  Other Topics Concern   Not on file  Social History Narrative   Not on file   Social  Determinants of Health   Financial Resource Strain: Not on file  Food Insecurity: Not on file  Transportation Needs: Not on file  Physical Activity: Not on file  Stress: Not on file  Social Connections: Not on file     Family History: The patient's family history includes Cancer in his brother, father, and maternal grandmother; Diabetes in his brother and daughter; Heart disease in his maternal grandmother; Hypertension in his mother.  ROS:   Please see the history of present illness.    Feels great.  No recurrent pleuritic pain.  All other systems reviewed and are negative.  EKGs/Labs/Other Studies Reviewed:    The following studies were reviewed today: Please see echo result from March.  EKG:  EKG not repeated  Recent Labs: 11/19/2020: ALT 28 01/22/2021: B Natriuretic Peptide 118.3 01/24/2021: Magnesium 2.3 02/12/2021: BUN 18; Creatinine, Ser 1.62; Potassium 4.8; Sodium 138 03/17/2021: Hemoglobin 12.7; Platelet Count 245  Recent Lipid Panel    Component Value Date/Time   CHOL 144 05/15/2020 0852   TRIG 58 05/15/2020 0852   HDL 55 05/15/2020 0852   CHOLHDL 2.6 05/15/2020 0852   LDLCALC 77 05/15/2020 0852    Physical Exam:    VS:  BP 110/76   Pulse 77   Ht 5\' 10"  (1.778 m)   Wt 222 lb (100.7 kg)   SpO2 98%   BMI 31.85 kg/m     Wt Readings from Last 3 Encounters:  04/30/21 222 lb (100.7 kg)  03/17/21 219 lb 14.4 oz (99.7 kg)  02/01/21 213 lb 12.8 oz (97 kg)     GEN: Healthy in appearance. No acute distress HEENT: Normal NECK: No JVD. LYMPHATICS: No lymphadenopathy CARDIAC: 2/6 systolic murmur. RRR S4 gallop, or edema. VASCULAR:  Normal Pulses. No bruits. RESPIRATORY:  Clear to auscultation without rales, wheezing or rhonchi  ABDOMEN: Soft, non-tender, non-distended, No pulsatile mass, MUSCULOSKELETAL: No deformity  SKIN: Warm and dry NEUROLOGIC:  Alert and oriented x 3 PSYCHIATRIC:  Normal affect   ASSESSMENT:    1. S/P AVR (aortic valve replacement)    2. Postcardiotomy syndrome   3. Chronic combined systolic and diastolic heart failure (HCC)   4. Mobitz (type) I (Wenckebach's) atrioventricular block   5. Hyperlipidemia with target LDL less than 70   6. Essential hypertension    PLAN:    In order of problems listed above:  Status post bioprosthetic valve placement for aortic regurgitation.  Patient is doing well.  Continue aspirin to complete a 53-month course postsurgery. Resolved LVEF fell after surgery.  Carvedilol and Entresto were started to preserve function.  After long conversation today, the patient prefers not to be on any medication.  We will wean carvedilol and stop Entresto.  He needs to follow his blood pressure closely at home and if it is consistently above 130/80, he needs to notify us. Did not discuss Crestor will be discontinued.  Most recent LDL was 72.  We will do a lipid panel prior to his office visit on no statin therapy in 3 months. He will monitor blood pressure at home off therapy.  He understands that target blood pressure is 130/80 mmHg.  Clinical follow-up 31-month follow-up with limited echo for LV function, lipid panel, and blood pressure data.   Medication Adjustments/Labs and Tests Ordered: Current medicines are reviewed at length with the patient today.  Concerns regarding medicines are outlined above.  Orders Placed This Encounter  Procedures   Lipid panel   No orders of the defined types were placed in this encounter.   There are no Patient Instructions on file for this visit.   Signed, Lesleigh Noe, MD  04/30/2021 3:25 PM    [facility]

## 2021-04-30 ENCOUNTER — Encounter: Payer: Self-pay | Admitting: Interventional Cardiology

## 2021-04-30 ENCOUNTER — Ambulatory Visit (INDEPENDENT_AMBULATORY_CARE_PROVIDER_SITE_OTHER): Payer: Managed Care, Other (non HMO) | Admitting: Interventional Cardiology

## 2021-04-30 ENCOUNTER — Other Ambulatory Visit: Payer: Self-pay

## 2021-04-30 VITALS — BP 110/76 | HR 77 | Ht 70.0 in | Wt 222.0 lb

## 2021-04-30 DIAGNOSIS — I5042 Chronic combined systolic (congestive) and diastolic (congestive) heart failure: Secondary | ICD-10-CM

## 2021-04-30 DIAGNOSIS — I10 Essential (primary) hypertension: Secondary | ICD-10-CM

## 2021-04-30 DIAGNOSIS — I970 Postcardiotomy syndrome: Secondary | ICD-10-CM

## 2021-04-30 DIAGNOSIS — Z952 Presence of prosthetic heart valve: Secondary | ICD-10-CM | POA: Diagnosis not present

## 2021-04-30 DIAGNOSIS — E785 Hyperlipidemia, unspecified: Secondary | ICD-10-CM

## 2021-04-30 DIAGNOSIS — I441 Atrioventricular block, second degree: Secondary | ICD-10-CM | POA: Diagnosis not present

## 2021-04-30 NOTE — Patient Instructions (Signed)
Medication Instructions:  1) DISCONTINUE Entresto and Rosuvastatin 2) In one week, DECREASE Carvedilol to a half tablet twice a day for a week, then discontinue 3) You can stop your Aspirin in July  *If you need a refill on your cardiac medications before your next appointment, please call your pharmacy*   Lab Work: Lipid panel prior to seeing Dr. Katrinka Blazing back. You will need to be fasting for these labs (nothing to eat or drink after midnight except water and black coffee).  If you have labs (blood work) drawn today and your tests are completely normal, you will receive your results only by: MyChart Message (if you have MyChart) OR A paper copy in the mail If you have any lab test that is abnormal or we need to change your treatment, we will call you to review the results.   Testing/Procedures: Your physician has requested that you have an echocardiogram prior to seeing Dr. Katrinka Blazing back. Echocardiography is a painless test that uses sound waves to create images of your heart. It provides your doctor with information about the size and shape of your heart and how well your heart's chambers and valves are working. This procedure takes approximately one hour. There are no restrictions for this procedure.    Follow-Up: At [facility], you and your health needs are our priority.  As part of our continuing mission to provide you with exceptional heart care, we have created designated Provider Care Teams.  These Care Teams include your primary Cardiologist (physician) and Advanced Practice Providers (APPs -  Physician Assistants and Nurse Practitioners) who all work together to provide you with the care you need, when you need it.  We recommend signing up for the patient portal called "MyChart".  Sign up information is provided on this After Visit Summary.  MyChart is used to connect with patients for Virtual Visits (Telemedicine).  Patients are able to view lab/test results, encounter notes,  upcoming appointments, etc.  Non-urgent messages can be sent to your provider as well.   To learn more about what you can do with MyChart, go to [email or web address].    Your next appointment:   3 month(s)  The format for your next appointment:   In Person  Provider:   You may see Lesleigh Noe, MD or one of the following Advanced Practice Providers on your designated Care Team:   Georgie Chard, NP   Other Instructions

## 2021-05-19 ENCOUNTER — Telehealth: Payer: Self-pay | Admitting: Internal Medicine

## 2021-05-19 NOTE — Telephone Encounter (Signed)
Cancelled appts per 6/28 sch msg. Pt declined to r/s at this time.

## 2021-05-27 ENCOUNTER — Other Ambulatory Visit: Payer: Self-pay | Admitting: *Deleted

## 2021-05-27 ENCOUNTER — Telehealth: Payer: Self-pay | Admitting: Interventional Cardiology

## 2021-05-27 DIAGNOSIS — I5042 Chronic combined systolic (congestive) and diastolic (congestive) heart failure: Secondary | ICD-10-CM

## 2021-05-27 MED ORDER — CARVEDILOL 6.25 MG PO TABS: 6.2500 mg | ORAL_TABLET | Freq: Two times a day (BID) | ORAL | 3 refills | Status: DC

## 2021-05-27 MED ORDER — ENTRESTO 24-26 MG PO TABS: 1.0000 | ORAL_TABLET | Freq: Two times a day (BID) | ORAL | 11 refills | Status: DC

## 2021-05-27 NOTE — Telephone Encounter (Signed)
Pt seen 6/10 and was instructed to stop Entresto, after one week decrease Coreg to hafl tablet BID , then d/c.  Pt now off both medications.  Recent BPs 134-179/99-113.  Most recent BP was 134/99.  Currently out of town in [place] and didn't have any Entresto or Coreg with him so I could tell him to take any.  Denies any symptoms.  Feels fine.  HRs 68-82.  Advised I will send message to Dr. Katrinka Blazing for review and advisement.

## 2021-05-27 NOTE — Telephone Encounter (Signed)
Resume Carvedilol and Entresto as previously taken. He has hypertension which always need therapy. IU prefer these meds as LVEF is below normal range. BMET 1 month.  ----- Message -----  From: Julio Sicks, RN  Sent: 05/27/2021   9:56 AM EDT  To: Lyn Records, MD, Julio Sicks, RN    Spoke with pt and reviewed recommendations.  Pt will come in for labs on 8/5.  Pt verbalized understanding and was in agreement with plan.

## 2021-05-27 NOTE — Telephone Encounter (Signed)
Pt c/o BP issue: STAT if pt c/o blurred vision, one-sided weakness or slurred speech  1. What are your last 5 BP readings?   - 147/108 - 163/109 - 179/113 - 147/111 - 134/99   2. Are you having any other symptoms (ex. Dizziness, headache, blurred vision, passed out)? no  3. What is your BP issue? Pt states that blood pressure is high and he is concerned.. please advise.

## 2021-06-09 ENCOUNTER — Telehealth: Payer: Self-pay | Admitting: Interventional Cardiology

## 2021-06-09 ENCOUNTER — Other Ambulatory Visit: Payer: Self-pay | Admitting: *Deleted

## 2021-06-09 MED ORDER — ENTRESTO 49-51 MG PO TABS: 1.0000 | ORAL_TABLET | Freq: Two times a day (BID) | ORAL | 11 refills | Status: DC

## 2021-06-09 MED ORDER — AMOXICILLIN 500 MG PO CAPS
ORAL_CAPSULE | ORAL | 3 refills | Status: DC
Start: 2021-06-09 — End: 2021-08-17

## 2021-06-09 NOTE — Telephone Encounter (Signed)
Spoke with pt and made him aware of recommendations per Dr. Katrinka Blazing.  Pt agreeable to plan.  He will come for labs 7/29.  Pt aware to continue monitoring BPs.  Pt appreciative for call.

## 2021-06-09 NOTE — Telephone Encounter (Signed)
Pt calling about BP still being elevated.  Has been taking Entresto 24/26 BID and Coreg 6.25mg  BID since 7/10.  Occasionally has a slight HA, but otherwise no symptoms.  Denies increased salt in his diet.  Advised I will send message to Dr. Katrinka Blazing for review and advisement.     Recent BPs 122/94 131/95 130/90 141/94 128/97  Pt states he was previously on Amlodipine and it caused a cough, so he doesn't want to be put on that if you feel like he needs another medication.

## 2021-06-09 NOTE — Telephone Encounter (Signed)
Agree it needs to be better. Increase Entresto to 48/51 mg twice daily.  Bmet in 7 to 10 days.

## 2021-06-09 NOTE — Telephone Encounter (Signed)
Pt c/o BP issue: STAT if pt c/o blurred vision, one-sided weakness or slurred speech  1. What are your last 5 BP readings? 122/94 131/95 130/90 141/94 128/97  2. Are you having any other symptoms (ex. Dizziness, headache, blurred vision, passed out)? Patient just feels funny. Its like he is under stress but he doesn't have anything to stress about  3. What is your BP issue? Patient concerned about the bottom numbers and wants them to come out of the 90s

## 2021-06-16 ENCOUNTER — Ambulatory Visit: Payer: Managed Care, Other (non HMO) | Admitting: Internal Medicine

## 2021-06-16 ENCOUNTER — Other Ambulatory Visit: Payer: Managed Care, Other (non HMO)

## 2021-06-18 ENCOUNTER — Other Ambulatory Visit: Payer: Managed Care, Other (non HMO) | Admitting: *Deleted

## 2021-06-18 ENCOUNTER — Other Ambulatory Visit: Payer: Self-pay

## 2021-06-18 DIAGNOSIS — I5042 Chronic combined systolic (congestive) and diastolic (congestive) heart failure: Secondary | ICD-10-CM

## 2021-06-19 LAB — BASIC METABOLIC PANEL
BUN/Creatinine Ratio: 13 (ref 10–24)
BUN: 21 mg/dL (ref 8–27)
CO2: 22 mmol/L (ref 20–29)
Calcium: 9.3 mg/dL (ref 8.6–10.2)
Chloride: 104 mmol/L (ref 96–106)
Creatinine, Ser: 1.6 mg/dL — ABNORMAL HIGH (ref 0.76–1.27)
Glucose: 88 mg/dL (ref 65–99)
Potassium: 4.9 mmol/L (ref 3.5–5.2)
Sodium: 141 mmol/L (ref 134–144)
eGFR: 48 mL/min/{1.73_m2} — ABNORMAL LOW (ref >59)

## 2021-06-24 ENCOUNTER — Other Ambulatory Visit: Payer: Self-pay | Admitting: *Deleted

## 2021-06-24 MED ORDER — CARVEDILOL 12.5 MG PO TABS: 12.5000 mg | ORAL_TABLET | Freq: Two times a day (BID) | ORAL | 1 refills | Status: DC

## 2021-06-24 NOTE — Progress Notes (Signed)
Increase the Carvedilol to 12.5 mg BID.

## 2021-06-25 ENCOUNTER — Other Ambulatory Visit: Payer: Managed Care, Other (non HMO)

## 2021-07-09 ENCOUNTER — Ambulatory Visit (INDEPENDENT_AMBULATORY_CARE_PROVIDER_SITE_OTHER): Payer: Managed Care, Other (non HMO) | Admitting: Pharmacist

## 2021-07-09 ENCOUNTER — Other Ambulatory Visit: Payer: Self-pay

## 2021-07-09 VITALS — BP 110/80 | HR 60 | Wt 221.0 lb

## 2021-07-09 DIAGNOSIS — I10 Essential (primary) hypertension: Secondary | ICD-10-CM

## 2021-07-09 NOTE — Progress Notes (Signed)
Patient ID: Eric [name]                 DOB: 09-02-1956                      MRN: [id number]     HPI: Eric [name] is a 65 y.o. male referred by Dr. Katrinka Blazing to HTN clinic. 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 EF after valve surgery was 40-45%.  At appointment with Dr. Katrinka Blazing 6/10 patient and Dr. Katrinka Blazing had a long converstation about risk prevention. Patient expressed not wanting to be on medications. His Entresto and carvedilol were stopped. However patient called in 7/7 reporting higher blood pressures. Entresto and carvedilo were resumed and have been increased due to high BP. BMP stable.  Patient presents today to HTN clinic. He denies dizziness, lightheadedness, headache, blurred vision, SOB or swelling. Energy level is ok. Walks or rides the elliptical  everyday. BP at home has been 120's/85-90 per his report. Brings in his omron upper arm cuff. 108/80-clinic, 115/83-home, 110/80- clinic. Home cuff found to be accurate.   Current HTN meds: carvedilol 12.5mg  twice a day, Entresto 49/51mg  twice a day Previously tried: amlodipine (cough), valsartan, valsartan HCTZ  BP goal: <130/80  Family History: The patient's family history includes Cancer in his brother, father, and maternal grandmother; Diabetes in his brother and daughter; Heart disease in his maternal grandmother; Hypertension in his mother.  Social History: never smoked, ETOH occasionally   Diet:  breakfast: [place] bacon, eggs, oatmeal, sometime a pastry, salmon patty and toast Lunch: clam chowder, left overs, sandwich  Dinner: baked or grilled fish (salmon, tilapia, mahi mahi, grouper) vegetables and rice Sometimes chicken dish, seldom beef, no pork Drink: water, un sweet tea, occasionally juice, soda once every  2 weeks Snack: nuts, fruits, cheese crackers granola bars  Exercise: 30 min on elipical 3-4 times a week, walks 3-4 miles once or twice a week  Home BP readings: 120's mostly, 85-90  Wt Readings from Last 3 Encounters:  04/30/21 222 lb (100.7 kg)  03/17/21 219 lb 14.4 oz (99.7 kg)  02/01/21 213 lb 12.8 oz (97 kg)   BP Readings from Last 3 Encounters:  04/30/21 110/76  03/17/21 123/83  02/01/21 128/84   Pulse Readings from Last 3 Encounters:  04/30/21 77  03/17/21 71  02/01/21 85    Renal function: CrCl cannot be calculated (Unknown ideal weight.).  Past Medical History:  Diagnosis Date   Allergy    ALLERGIC RHINITIS..WORSE SPRING/FALL   Aortic valve regurgitation 07/10/14   ECHO @ [facility]   Arthritis    Chronic kidney disease    Stage 3 , followed by [facility]   GERD (gastroesophageal reflux disease)    CURRENTLY DIET CONTROLLED   Heart murmur    Hypertension    Pneumonia 06/2019   Sinusitis, chronic     Current Outpatient Medications on File Prior to Visit  Medication Sig Dispense Refill   acetaminophen (TYLENOL) 500 MG tablet Take 1,000 mg by mouth every 6 (six) hours as needed (pain).     allopurinol (ZYLOPRIM) 300 MG tablet Take 300 mg by mouth daily.  4   amoxicillin (AMOXIL) 500 MG capsule Take 4 tablets (2000mg  total) by mouth 1 hour prior to dental work 4 capsule 3   carvedilol (  COREG) 12.5 MG tablet Take 1 tablet (12.5 mg total) by mouth 2 (two) times daily. 180 tablet 1   ferrous sulfate 325 (65 FE) MG tablet Take 325 mg by mouth daily.     sacubitril-valsartan (ENTRESTO) 49-51 MG Take 1 tablet by mouth 2 (two) times daily. 60 tablet 11   No current facility-administered medications on file prior to visit.    Allergies  Allergen Reactions   Amlodipine Cough    There were no vitals taken for this visit.   Assessment/Plan:  1. Hypertension - BP very close to goal of <130/80 in clinic today. Systolic is at goal at home, diastolic  runs a little high. Spoke with patient about either increasing Entresto or trying to improve intensity of his exercise as it sounds like he does not exert himself all that much. Also discussed adding in resistance training. Patient wished to work on exercise and portion control to loose some weight. Continue carvedilol 12.5mg  twice a day and Entresto 49/51mg  twice a day. Follow up with Dr. Katrinka Blazing at the end of Sept.   Thank you  Olene Floss, Pharm.D, BCPS, CPP [facility]  [address], [place], [place] [address]  Phone: [phone]; Fax: [phone]

## 2021-07-09 NOTE — Patient Instructions (Addendum)
Continue carvedilol 12.5mg  twice a day, Entresto 49/51mg  twice a day  Work on increasing the pace of your walk Add some resistance training 2-3 times a week Work on portion control  Call me at [phone] with any questions or concerns  Check blood pressure at least 4 times a week

## 2021-08-13 ENCOUNTER — Other Ambulatory Visit: Payer: Managed Care, Other (non HMO)

## 2021-08-13 ENCOUNTER — Other Ambulatory Visit: Payer: Self-pay

## 2021-08-13 ENCOUNTER — Ambulatory Visit (HOSPITAL_COMMUNITY): Payer: Managed Care, Other (non HMO) | Attending: Cardiology

## 2021-08-13 DIAGNOSIS — I77819 Aortic ectasia, unspecified site: Secondary | ICD-10-CM | POA: Insufficient documentation

## 2021-08-13 DIAGNOSIS — I5042 Chronic combined systolic (congestive) and diastolic (congestive) heart failure: Secondary | ICD-10-CM | POA: Diagnosis not present

## 2021-08-13 DIAGNOSIS — I340 Nonrheumatic mitral (valve) insufficiency: Secondary | ICD-10-CM | POA: Insufficient documentation

## 2021-08-13 DIAGNOSIS — I130 Hypertensive heart and chronic kidney disease with heart failure and stage 1 through stage 4 chronic kidney disease, or unspecified chronic kidney disease: Secondary | ICD-10-CM | POA: Insufficient documentation

## 2021-08-13 DIAGNOSIS — I371 Nonrheumatic pulmonary valve insufficiency: Secondary | ICD-10-CM | POA: Diagnosis not present

## 2021-08-13 DIAGNOSIS — N183 Chronic kidney disease, stage 3 unspecified: Secondary | ICD-10-CM | POA: Insufficient documentation

## 2021-08-13 DIAGNOSIS — D649 Anemia, unspecified: Secondary | ICD-10-CM | POA: Insufficient documentation

## 2021-08-13 DIAGNOSIS — Z8616 Personal history of COVID-19: Secondary | ICD-10-CM | POA: Diagnosis not present

## 2021-08-13 DIAGNOSIS — Z953 Presence of xenogenic heart valve: Secondary | ICD-10-CM | POA: Insufficient documentation

## 2021-08-13 LAB — ECHOCARDIOGRAM LIMITED
AV Mean grad: 12 mmHg
AV Peak grad: 22.5 mmHg
Ao pk vel: 2.37 m/s
Area-P 1/2: 2.56 cm2
S' Lateral: 2.9 cm

## 2021-08-15 NOTE — Progress Notes (Signed)
Cardiology Office Note:    Date:  08/17/2021   ID:  Eric, [name] 1956-05-11, MRN [id number]  PCP:  Elias Else, MD  Cardiologist:  Lesleigh Noe, MD   Referring MD: Elias Else, MD   Chief Complaint  Patient presents with   Cardiac Valve Problem   Hyperlipidemia   Hypertension     History of Present Illness:    Eric [name] is a 65 y.o. 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   Mr. Eric [name] is doing well with the exception of a dry hacking cough.  He correlates this with one of the medications perhaps.  He characterizes it as a tickle in the back of his throat that is not associated with phlegm production.  He denies shortness of breath.  He is physically doing well.  I told him that the tickle in his throat could be related to [facility] although it is ARB based and not angiotensin-converting enzyme inhibitor days.  He further has what we going to do about his cholesterol.  Shortly after surgery he wanted to come off of all medications.  He felt that the reason for surgery was to repair the heart and get him off of therapy.  When this occurred, his blood pressure increased and cholesterol levels doubled.  He now understands the silent nature of both processes and the preventive advantage as opposed to secondary prevention.  Past Medical History:  Diagnosis Date   Allergy    ALLERGIC RHINITIS..WORSE SPRING/FALL   Aortic valve regurgitation 07/10/14   ECHO @ [facility]   Arthritis    Chronic kidney disease    Stage 3 , followed by [facility]   GERD (gastroesophageal reflux disease)    CURRENTLY DIET CONTROLLED   Heart murmur    Hypertension    Pneumonia 06/2019   Sinusitis, chronic      Past Surgical History:  Procedure Laterality Date   AORTIC VALVE REPLACEMENT N/A 11/23/2020   Procedure: AORTIC VALVE REPLACEMENT (AVR) USING EDWARDS INSPIRIS RESILIA 25 MM AORTIC VALVE;  Surgeon: Alleen Borne, MD;  Location: [facility] OR;  Service: Open Heart Surgery;  Laterality: N/A;   COLONOSCOPY     KNEE ARTHROSCOPY Bilateral    LASIK     TEE WITHOUT CARDIOVERSION N/A 11/23/2020   Procedure: TRANSESOPHAGEAL ECHOCARDIOGRAM (TEE);  Surgeon: Alleen Borne, MD;  Location: [facility] OR;  Service: Open Heart Surgery;  Laterality: N/A;    Current Medications: Current Meds  Medication Sig   acetaminophen (TYLENOL) 500 MG tablet Take 1,000 mg by mouth every 6 (six) hours as needed (pain).   allopurinol (ZYLOPRIM) 300 MG tablet Take 300 mg by mouth daily.   carvedilol (COREG) 12.5 MG tablet Take 1 tablet (12.5 mg total) by mouth 2 (two) times daily.   rosuvastatin (CRESTOR) 10 MG tablet Take 1 tablet (10 mg total) by mouth daily.   sacubitril-valsartan (ENTRESTO) 49-51 MG Take 1 tablet by mouth 2 (two) times daily.     Allergies:   Amlodipine   Social History   Socioeconomic History   Marital status: Married    Spouse name: [name]   Number of children: 3   Years of education: 18   Highest education level: Master's degree (e.g., MA, MS, MEng, MEd, MSW, MBA)  Occupational History   Occupation: Art gallery manager  Tobacco Use   Smoking status: Never   Smokeless tobacco: Never  Vaping Use   Vaping Use: Never used  Substance and Sexual Activity   Alcohol use: Yes    Alcohol/week: 2.0 standard drinks    Types: 2 Standard drinks or equivalent per week    Comment: occasional   Drug use: No   Sexual activity: Yes  Other Topics Concern   Not on file  Social History Narrative   Not on file   Social Determinants of Health   Financial Resource Strain: Not on file  Food Insecurity: Not on file  Transportation Needs: Not on file  Physical Activity: Not on file  Stress: Not on file  Social  Connections: Not on file     Family History: The patient's family history includes Cancer in his brother, father, and maternal grandmother; Diabetes in his brother and daughter; Heart disease in his maternal grandmother; Hypertension in his mother.  ROS:   Please see the history of present illness.    For this problem and allow him all other systems reviewed and are negative.  EKGs/Labs/Other Studies Reviewed:    The following studies were reviewed today:  2 D Doppler Echocardiogram 08/12/2021: IMPRESSIONS     1. Hypokinesis of the mid and distal septum with overall preserved LV  function; LV function improved compared to 01/20/21.   2. Left ventricular ejection fraction, by estimation, is 55 to 60%. The  left ventricle has normal function. The left ventricle demonstrates  regional wall motion abnormalities (see scoring diagram/findings for  description). There is mild left ventricular   hypertrophy. Left ventricular diastolic parameters are consistent with  Grade I diastolic dysfunction (impaired relaxation). The average left  ventricular global longitudinal strain is -17.2 %. The global longitudinal  strain is normal.   3. Right ventricular systolic function is normal. The right ventricular  size is normal. There is normal pulmonary artery systolic pressure.   4. The mitral valve is normal in structure. Mild mitral valve  regurgitation.   5. The aortic valve has been repaired/replaced. Aortic valve  regurgitation is not visualized. There is a 25 mm Edwards Inspiris Resilia  valve present in the aortic position. Procedure Date: 11/23/20.   6. Aortic dilatation noted. There is mild dilatation of the aortic root,  measuring 39 mm. There is mild dilatation of the ascending aorta,  measuring 40 mm.   Comparison(s): 01/20/21 EF 40-45%. AV mean PG, peak PG.     EKG:  EKG a new tracing is not performed  Recent Labs: 11/19/2020: ALT 28 01/22/2021: B Natriuretic Peptide  118.3 01/24/2021: Magnesium 2.3 03/17/2021: Hemoglobin 12.7; Platelet Count 245 06/18/2021: BUN 21; Creatinine, Ser 1.60; Potassium 4.9; Sodium 141  Recent Lipid Panel    Component Value Date/Time   CHOL 144 05/15/2020 0852   TRIG 58 05/15/2020 0852   HDL 55 05/15/2020 0852   CHOLHDL 2.6 05/15/2020 0852   LDLCALC 77 05/15/2020 0852    Physical Exam:    VS:  BP 108/78   Pulse 63   Ht 5\' 10"  (1.778 m)   Wt 226 lb 3.2 oz (102.6 kg)   SpO2 97%   BMI 32.46 kg/m     Wt Readings from Last 3 Encounters:  08/17/21 226 lb 3.2 oz (102.6 kg)  07/09/21 221 lb (100.2 kg)  04/30/21 222 lb (100.7 kg)     GEN: Slightly heavier than last office visit. No acute distress HEENT: Normal  NECK: No JVD. LYMPHATICS: No lymphadenopathy CARDIAC: 2/6 systolic right upper sternal murmur. RRR no gallop, or edema. VASCULAR:  Normal Pulses. No bruits. RESPIRATORY:  Clear to auscultation without rales, wheezing or rhonchi  ABDOMEN: Soft, non-tender, non-distended, No pulsatile mass, MUSCULOSKELETAL: No deformity  SKIN: Warm and dry NEUROLOGIC:  Alert and oriented x 3 PSYCHIATRIC:  Normal affect   ASSESSMENT:    1. S/P AVR (aortic valve replacement)   2. Chronic combined systolic and diastolic heart failure (HCC)   3. Essential hypertension   4. Mobitz (type) I (Wenckebach's) atrioventricular block   5. Hyperlipidemia with target LDL less than 70   6. Postcardiotomy syndrome    PLAN:    In order of problems listed above:  We reviewed the echo results.  The bioprosthetic aortic valve does not demonstrate any evidence of regurgitation.  Leaflet motion is normal.  There is minimal gradient across the valve. Systolic function has returned to normal on two-pronged heart failure therapy including carvedilol and Entresto.  Most recent creatinine was 1.5.  This is an improvement compared to prior.  He is having dry hacking cough, possibly medication related for the time being we will continue the current  course. Blood pressure is under excellent control on the current medication regimen. Clinical observation.  EKG has not been done today. Will start/resume rosuvastatin 10 mg/day.  Target LDL less than 70.  Lipid panel in 2 months.  At the same time a comprehensive metabolic panel will be performed. Resolved.  Clinical follow-up.  LV dysfunction improved on therapy.  Blood pressure is now excellent.  Primary prevention of lipid abnormality is being pursued.  Possible side effect of Entresto with hacking dry cough.   Medication Adjustments/Labs and Tests Ordered: Current medicines are reviewed at length with the patient today.  Concerns regarding medicines are outlined above.  Orders Placed This Encounter  Procedures   Lipid panel   Hepatic function panel   Basic metabolic panel    Meds ordered this encounter  Medications   rosuvastatin (CRESTOR) 10 MG tablet    Sig: Take 1 tablet (10 mg total) by mouth daily.    Dispense:  90 tablet    Refill:  3     Patient Instructions  Medication Instructions:  1) START Rosuvastatin 10mg  once daily  *If you need a refill on your cardiac medications before your next appointment, please call your pharmacy*   Lab Work: BMET, Lipid and Liver in December.  You will need to be fasting for these labs (nothing to eat or drink after midnight except water and black coffee).  If you have labs (blood work) drawn today and your tests are completely normal, you will receive your results only by: MyChart Message (if you have MyChart) OR A paper copy in the mail If you have any lab test that is abnormal or we need to change your treatment, we will call you to review the results.   Testing/Procedures: None   Follow-Up: At [facility], you and your health needs are our priority.  As part of our continuing mission to provide you with exceptional heart care, we have created designated Provider Care Teams.  These Care Teams include your primary  Cardiologist (physician) and Advanced Practice Providers (APPs -  Physician Assistants and Nurse Practitioners) who all work together to provide you with the care you need, when you need it.  We recommend signing up for the patient portal called "MyChart".  Sign up information is provided on this After  Visit Summary.  MyChart is used to connect with patients for Virtual Visits (Telemedicine).  Patients are able to view lab/test results, encounter notes, upcoming appointments, etc.  Non-urgent messages can be sent to your provider as well.   To learn more about what you can do with MyChart, go to [email or web address].    Your next appointment:   4-6 month(s)  The format for your next appointment:   In Person  Provider:   You may see Lesleigh Noe, MD or one of the following Advanced Practice Providers on your designated Care Team:   Nada Boozer, NP   Other Instructions     Signed, Lesleigh Noe, MD  08/17/2021 1:49 PM    [facility]

## 2021-08-17 ENCOUNTER — Ambulatory Visit (INDEPENDENT_AMBULATORY_CARE_PROVIDER_SITE_OTHER): Payer: Managed Care, Other (non HMO) | Admitting: Interventional Cardiology

## 2021-08-17 ENCOUNTER — Encounter: Payer: Self-pay | Admitting: Interventional Cardiology

## 2021-08-17 ENCOUNTER — Other Ambulatory Visit: Payer: Self-pay

## 2021-08-17 VITALS — BP 108/78 | HR 63 | Ht 70.0 in | Wt 226.2 lb

## 2021-08-17 DIAGNOSIS — I5042 Chronic combined systolic (congestive) and diastolic (congestive) heart failure: Secondary | ICD-10-CM | POA: Diagnosis not present

## 2021-08-17 DIAGNOSIS — I10 Essential (primary) hypertension: Secondary | ICD-10-CM | POA: Diagnosis not present

## 2021-08-17 DIAGNOSIS — E785 Hyperlipidemia, unspecified: Secondary | ICD-10-CM

## 2021-08-17 DIAGNOSIS — Z952 Presence of prosthetic heart valve: Secondary | ICD-10-CM

## 2021-08-17 DIAGNOSIS — I970 Postcardiotomy syndrome: Secondary | ICD-10-CM

## 2021-08-17 DIAGNOSIS — I441 Atrioventricular block, second degree: Secondary | ICD-10-CM | POA: Diagnosis not present

## 2021-08-17 MED ORDER — ROSUVASTATIN CALCIUM 10 MG PO TABS: 10.0000 mg | ORAL_TABLET | Freq: Every day | ORAL | 3 refills | Status: DC

## 2021-08-17 NOTE — Patient Instructions (Signed)
Medication Instructions:  1) START Rosuvastatin 10mg  once daily  *If you need a refill on your cardiac medications before your next appointment, please call your pharmacy*   Lab Work: BMET, Lipid and Liver in December.  You will need to be fasting for these labs (nothing to eat or drink after midnight except water and black coffee).  If you have labs (blood work) drawn today and your tests are completely normal, you will receive your results only by: MyChart Message (if you have MyChart) OR A paper copy in the mail If you have any lab test that is abnormal or we need to change your treatment, we will call you to review the results.   Testing/Procedures: None   Follow-Up: At [facility], you and your health needs are our priority.  As part of our continuing mission to provide you with exceptional heart care, we have created designated Provider Care Teams.  These Care Teams include your primary Cardiologist (physician) and Advanced Practice Providers (APPs -  Physician Assistants and Nurse Practitioners) who all work together to provide you with the care you need, when you need it.  We recommend signing up for the patient portal called "MyChart".  Sign up information is provided on this After Visit Summary.  MyChart is used to connect with patients for Virtual Visits (Telemedicine).  Patients are able to view lab/test results, encounter notes, upcoming appointments, etc.  Non-urgent messages can be sent to your provider as well.   To learn more about what you can do with MyChart, go to [facility].    Your next appointment:   4-6 month(s)  The format for your next appointment:   In Person  Provider:   You may see [email or web address], MD or one of the following Advanced Practice Providers on your designated Care Team:   Lesleigh Noe, NP   Other Instructions

## 2021-08-24 ENCOUNTER — Encounter: Payer: Self-pay | Admitting: Interventional Cardiology

## 2021-08-24 ENCOUNTER — Telehealth: Payer: Self-pay | Admitting: Interventional Cardiology

## 2021-08-24 NOTE — Telephone Encounter (Signed)
Pt has had a chronic cough since starting Entresto.  He is calling for recommendations.

## 2021-08-24 NOTE — Telephone Encounter (Signed)
Error

## 2021-08-24 NOTE — Telephone Encounter (Signed)
Stop taking Entresto to see if cough resolves. Monitor BP ogg thrrapy. Notify if BP > 140/80 consistently\.

## 2021-08-24 NOTE — Telephone Encounter (Signed)
Pt c/o medication issue:  1. Name of Medication:  sacubitril-valsartan (ENTRESTO) 49-51 MG  2. How are you currently taking this medication (dosage and times per day)?  1 tablet by mouth 2 (two) times daily  3. Are you having a reaction (difficulty breathing--STAT)?  See below  4. What is your medication issue?   Patient states he has had a chronic cough ever since he started taking this medication. He would like to know what Dr. Katrinka Blazing recommends.

## 2021-08-25 NOTE — Telephone Encounter (Signed)
Spoke with pt and made him aware of recommendations.  Pt agreeable to plan.  

## 2021-09-07 ENCOUNTER — Telehealth: Payer: Self-pay | Admitting: Interventional Cardiology

## 2021-09-07 DIAGNOSIS — I10 Essential (primary) hypertension: Secondary | ICD-10-CM

## 2021-09-07 NOTE — Telephone Encounter (Signed)
Pt c/o BP issue: STAT if pt c/o blurred vision, one-sided weakness or slurred speech  1. What are your last 5 BP readings? 157/106 this morning, 164/116 thinks it was yesterday, 140/100, 141/98, 149/106  2. Are you having any other symptoms (ex. Dizziness, headache, blurred vision, passed out)? Slight headache  3. What is your BP issue? Patient states his BP has been high, but has not has had any symptoms aside from a slight headache.

## 2021-09-07 NOTE — Telephone Encounter (Signed)
Pt recently taken off of Entresto 49/51 due to cough.  This was stopped on 10/5.  Cough improved after starting.  BP has gone up quite a bit after stopping though.  This morning was 157/106, yesterday 164/116, 140/100, 141/98 and 149/106.  Denies symptoms other than a slight headache.  Advised pt to go ahead and restart Entresto and I will send message to Dr. Katrinka Blazing for review.  Pt also had a cough with Amlodipine.

## 2021-09-07 NOTE — Telephone Encounter (Signed)
Left message to call back  

## 2021-09-08 MED ORDER — SPIRONOLACTONE 25 MG PO TABS: 25.0000 mg | ORAL_TABLET | Freq: Every day | ORAL | 3 refills | Status: DC

## 2021-09-08 NOTE — Addendum Note (Signed)
Addended by: Julio Sicks on: 09/08/2021 11:42 AM   Modules accepted: Orders

## 2021-09-08 NOTE — Telephone Encounter (Signed)
Stop entresto if cough not tolerable. Start Spironolactone 12.5 mg daily. BMET 7-10 days later.  ----- Message -----  From: Julio Sicks, RN  Sent: 09/07/2021   4:17 PM EDT  To: Lyn Records, MD, Julio Sicks, RN   Spoke with pt and reviewed recommendations per Dr. Katrinka Blazing.  Pt agreeable to plan.  Will come for labs on 10/27.

## 2021-09-10 ENCOUNTER — Telehealth: Payer: Self-pay | Admitting: Interventional Cardiology

## 2021-09-10 MED ORDER — SPIRONOLACTONE 25 MG PO TABS: 12.5000 mg | ORAL_TABLET | Freq: Every day | ORAL | 3 refills | Status: DC

## 2021-09-10 NOTE — Telephone Encounter (Signed)
Spoke with pt and he wanted to clarify how to take the Spironolactone.  Advised we wanted him to take a half tablet everyday.  He states that when you try to break them in half, they just fall apart.  Advised alternatively he could take the whole tablet every other day.  Pt agreeable to try this.  He will come for labs next Friday as planned.  He is aware to monitor BP and we will get those readings when we contact him about his labs.  Pt appreciative for call.

## 2021-09-10 NOTE — Telephone Encounter (Signed)
Patient was calling wanting to speak with the nurse. Please advise

## 2021-09-16 ENCOUNTER — Other Ambulatory Visit: Payer: Managed Care, Other (non HMO) | Admitting: *Deleted

## 2021-09-16 ENCOUNTER — Other Ambulatory Visit: Payer: Self-pay

## 2021-09-16 ENCOUNTER — Telehealth: Payer: Self-pay | Admitting: Interventional Cardiology

## 2021-09-16 DIAGNOSIS — I10 Essential (primary) hypertension: Secondary | ICD-10-CM

## 2021-09-16 NOTE — Telephone Encounter (Signed)
Per walk in wanted to leave a message for Dr. Katrinka Blazing nurse to call to discuss Aldactone that he started about 2 weeks ago.   Having size effects of coughing and medicine is not helping my BP.   Reading have been like 140-153  and bottom 89-108.   Want to go back to my old medicine.   Please call to advise.

## 2021-09-16 NOTE — Telephone Encounter (Signed)
Left message for patient to call back  

## 2021-09-17 LAB — BASIC METABOLIC PANEL
BUN/Creatinine Ratio: 9 — ABNORMAL LOW (ref 10–24)
BUN: 16 mg/dL (ref 8–27)
CO2: 26 mmol/L (ref 20–29)
Calcium: 9.9 mg/dL (ref 8.6–10.2)
Chloride: 99 mmol/L (ref 96–106)
Creatinine, Ser: 1.73 mg/dL — ABNORMAL HIGH (ref 0.76–1.27)
Glucose: 88 mg/dL (ref 70–99)
Potassium: 4.3 mmol/L (ref 3.5–5.2)
Sodium: 140 mmol/L (ref 134–144)
eGFR: 44 mL/min/{1.73_m2} — ABNORMAL LOW (ref >59)

## 2021-09-17 MED ORDER — ENTRESTO 49-51 MG PO TABS: 1.0000 | ORAL_TABLET | Freq: Two times a day (BID) | ORAL | 3 refills | Status: DC

## 2021-09-17 NOTE — Telephone Encounter (Signed)
Sounds like coughing did not resolve once Entresto was discontinued.  Should be ok to restart. Recommend he be seen by PCP to rule out other causes of cough.

## 2021-09-17 NOTE — Telephone Encounter (Signed)
Called patient back with Pharm D's recommendations. Switched patient back to [facility] and d/c spironolactone. Patient verbalized understanding. Encouraged patient to call back if he had any other issues. Patient will also keep a record of BPs and HRs and call us if too high or low.

## 2021-09-17 NOTE — Addendum Note (Signed)
Addended by: Virl Axe, Traveion Ruddock L on: 09/17/2021 05:16 PM   Modules accepted: Orders

## 2021-09-17 NOTE — Telephone Encounter (Signed)
Patient called back. Patient stated that his BP has been high on spirolactone 12.5 mg. Patient just had a BMET yesterday. Patient was previously on Entresto 49/51 mg BID and would like to go back on it. Patient had a side effect of coughing, but he does not care if he is coughing. Will forward to Pharm D to see if they can help since Dr. Katrinka Blazing is out today.

## 2021-11-05 ENCOUNTER — Other Ambulatory Visit: Payer: Self-pay

## 2021-11-05 ENCOUNTER — Other Ambulatory Visit: Payer: Managed Care, Other (non HMO) | Admitting: *Deleted

## 2021-11-05 DIAGNOSIS — I5042 Chronic combined systolic (congestive) and diastolic (congestive) heart failure: Secondary | ICD-10-CM

## 2021-11-05 DIAGNOSIS — E785 Hyperlipidemia, unspecified: Secondary | ICD-10-CM

## 2021-11-06 LAB — HEPATIC FUNCTION PANEL
ALT: 25 IU/L (ref 0–44)
AST: 24 IU/L (ref 0–40)
Albumin: 4.4 g/dL (ref 3.8–4.8)
Alkaline Phosphatase: 84 IU/L (ref 44–121)
Bilirubin Total: 0.7 mg/dL (ref 0.0–1.2)
Bilirubin, Direct: 0.18 mg/dL (ref 0.00–0.40)
Total Protein: 7.2 g/dL (ref 6.0–8.5)

## 2021-11-06 LAB — BASIC METABOLIC PANEL
BUN/Creatinine Ratio: 11 (ref 10–24)
BUN: 18 mg/dL (ref 8–27)
CO2: 25 mmol/L (ref 20–29)
Calcium: 9.2 mg/dL (ref 8.6–10.2)
Chloride: 101 mmol/L (ref 96–106)
Creatinine, Ser: 1.57 mg/dL — ABNORMAL HIGH (ref 0.76–1.27)
Glucose: 94 mg/dL (ref 70–99)
Potassium: 4.4 mmol/L (ref 3.5–5.2)
Sodium: 142 mmol/L (ref 134–144)
eGFR: 49 mL/min/{1.73_m2} — ABNORMAL LOW (ref >59)

## 2021-11-06 LAB — LIPID PANEL
Chol/HDL Ratio: 2.8 ratio (ref 0.0–5.0)
Cholesterol, Total: 147 mg/dL (ref 100–199)
HDL: 52 mg/dL (ref >39)
LDL Chol Calc (NIH): 79 mg/dL (ref 0–99)
Triglycerides: 83 mg/dL (ref 0–149)
VLDL Cholesterol Cal: 16 mg/dL (ref 5–40)

## 2021-11-08 ENCOUNTER — Telehealth: Payer: Self-pay | Admitting: Interventional Cardiology

## 2021-11-08 NOTE — Telephone Encounter (Signed)
See result note.  

## 2021-11-08 NOTE — Telephone Encounter (Signed)
Patient was returning call for results. Please advise °

## 2021-12-05 ENCOUNTER — Other Ambulatory Visit: Payer: Self-pay | Admitting: Interventional Cardiology

## 2022-01-13 NOTE — Progress Notes (Signed)
Cardiology Office Note:    Date:  01/14/2022   ID:  Eric [name] 05-05-1956, MRN [id number]  PCP:  Maury Dus, MD  Cardiologist:  Sinclair Grooms, MD   Referring MD: Maury Dus, MD   Chief Complaint  Patient presents with   Hypertension   Cardiac Valve Problem    Aortic valve replacement presenting    History of Present Illness:    Eric [name] is a 66 y.o. 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    Has a dry hacking cough and sometimes also a cough productive of white phlegm.  Otherwise he is doing great.  Past Medical History:  Diagnosis Date   Allergy    ALLERGIC RHINITIS..WORSE SPRING/FALL   Aortic valve regurgitation 07/10/14   ECHO @ [facility]   Arthritis    Chronic kidney disease    Stage 3 , followed by [facility]   GERD (gastroesophageal reflux disease)    CURRENTLY DIET CONTROLLED   Heart murmur    Hypertension    Pneumonia 06/2019   Sinusitis, chronic     Past Surgical History:  Procedure Laterality Date   AORTIC VALVE REPLACEMENT N/A 11/23/2020   Procedure: AORTIC VALVE REPLACEMENT (AVR) USING EDWARDS INSPIRIS RESILIA 25 MM AORTIC VALVE;  Surgeon: Gaye Pollack, MD;  Location: [place] OR;  Service: Open Heart Surgery;  Laterality: N/A;   COLONOSCOPY     KNEE ARTHROSCOPY Bilateral    LASIK     TEE WITHOUT CARDIOVERSION N/A 11/23/2020   Procedure: TRANSESOPHAGEAL ECHOCARDIOGRAM (TEE);  Surgeon: Gaye Pollack, MD;  Location: [place];  Service: Open Heart Surgery;  Laterality: N/A;    Current Medications: Current Meds  Medication Sig   acetaminophen (TYLENOL) 500 MG tablet Take 1,000 mg by mouth every 6 (six) hours as needed (pain).   allopurinol (ZYLOPRIM) 300  MG tablet Take 300 mg by mouth daily.   rosuvastatin (CRESTOR) 10 MG tablet TAKE 1 TABLET(10 MG) BY MOUTH DAILY   [DISCONTINUED] carvedilol (COREG) 12.5 MG tablet Take 1 tablet (12.5 mg total) by mouth 2 (two) times daily.   [DISCONTINUED] sacubitril-valsartan (ENTRESTO) 49-51 MG Take 1 tablet by mouth 2 (two) times daily.     Allergies:   Amlodipine   Social History   Socioeconomic History   Marital status: Married    Spouse name: Barnett Applebaum   Number of children: 3   Years of education: 18   Highest education level: Master's degree (e.g., MA, MS, MEng, MEd, MSW, MBA)  Occupational History   Occupation: Chief Financial Officer  Tobacco Use   Smoking status: Never   Smokeless tobacco: Never  Vaping Use   Vaping Use: Never used  Substance and Sexual Activity   Alcohol use: Yes    Alcohol/week: 2.0 standard drinks    Types: 2 Standard drinks or equivalent per week    Comment: occasional   Drug use: No   Sexual activity: Yes  Other Topics Concern   Not on file  Social History Narrative   Not on file   Social Determinants of Health   Financial Resource Strain: Not on file  Food Insecurity: Not on file  Transportation Needs: Not on file  Physical Activity: Not on file  Stress: Not on file  Social Connections: Not  on file     Family History: The patient's family history includes Cancer in his brother, father, and maternal grandmother; Diabetes in his brother and daughter; Heart disease in his maternal grandmother; Hypertension in his mother.  ROS:   Please see the history of present illness.    Feels well except for hacking nonproductive cough.  All other systems reviewed and are negative.  EKGs/Labs/Other Studies Reviewed:    The following studies were reviewed today:  2 D Doppler ECHOCARDIOGRAM 07/2021 IMPRESSIONS     1. Hypokinesis of the mid and distal septum with overall preserved LV  function; LV function improved compared to 01/20/21.   2. Left ventricular ejection fraction, by  estimation, is 55 to 60%. The  left ventricle has normal function. The left ventricle demonstrates  regional wall motion abnormalities (see scoring diagram/findings for  description). There is mild left ventricular   hypertrophy. Left ventricular diastolic parameters are consistent with  Grade I diastolic dysfunction (impaired relaxation). The average left  ventricular global longitudinal strain is -17.2 %. The global longitudinal  strain is normal.   3. Right ventricular systolic function is normal. The right ventricular  size is normal. There is normal pulmonary artery systolic pressure.   4. The mitral valve is normal in structure. Mild mitral valve  regurgitation.   5. The aortic valve has been repaired/replaced. Aortic valve  regurgitation is not visualized. There is a 25 mm Edwards Inspiris Resilia  valve present in the aortic position. Procedure Date: 11/23/20.   6. Aortic dilatation noted. There is mild dilatation of the aortic root,  measuring 39 mm. There is mild dilatation of the ascending aorta,  measuring 40 mm.   Comparison(s): 01/20/21 EF 40-45%. AV 72mmHg mean PG, 49mmHg peak PG.    EKG:  EKG sinus bradycardia at 45 bpm, otherwise unremarkable.  Compared to the tracing from 01/23/2021 the heart rate was 85 with first-degree AV block.  Recent Labs: 01/22/2021: B Natriuretic Peptide 118.3 01/24/2021: Magnesium 2.3 03/17/2021: Hemoglobin 12.7; Platelet Count 245 11/05/2021: ALT 25; BUN 18; Creatinine, Ser 1.57; Potassium 4.4; Sodium 142  Recent Lipid Panel    Component Value Date/Time   CHOL 147 11/05/2021 1259   TRIG 83 11/05/2021 1259   HDL 52 11/05/2021 1259   CHOLHDL 2.8 11/05/2021 1259   LDLCALC 79 11/05/2021 1259    Physical Exam:    VS:  BP 118/88    Pulse (!) 45    Ht 5\' 10"  (1.778 m)    Wt 217 lb 3.2 oz (98.5 kg)    SpO2 96%    BMI 31.16 kg/m     Wt Readings from Last 3 Encounters:  01/14/22 217 lb 3.2 oz (98.5 kg)  08/17/21 226 lb 3.2 oz (102.6 kg)  07/09/21  221 lb (100.2 kg)     GEN: Overweight. No acute distress HEENT: Normal NECK: No JVD. LYMPHATICS: No lymphadenopathy CARDIAC: 2/6 systolic no diastolic murmur. RRR no gallop, or edema. VASCULAR:  Normal Pulses. No bruits. RESPIRATORY:  Clear to auscultation without rales, wheezing or rhonchi  ABDOMEN: Soft, non-tender, non-distended, No pulsatile mass, MUSCULOSKELETAL: No deformity  SKIN: Warm and dry NEUROLOGIC:  Alert and oriented x 3 PSYCHIATRIC:  Normal affect   ASSESSMENT:    1. S/P AVR (aortic valve replacement)   2. Essential hypertension   3. Chronic combined systolic and diastolic heart failure ([place])   4. Mobitz (type) I (Wenckebach's) atrioventricular block   5. Hyperlipidemia with target LDL less than 70   6.  Postcardiotomy syndrome    PLAN:    In order of problems listed above:  Valve function remains normal.  See echo results above. Blood pressure 118/88.  We will be decreasing the intensity of carvedilol due to asymptomatic marked sinus bradycardia. Improved on carvedilol and Entresto. No evidence of heart block on today's EKG.  Does have marked sinus bradycardia.  Decrease carvedilol to 6.25 mg twice daily. Continue rosuvastatin 10 mg/day. No recurrence   Increase physical activity.  Decrease carbohydrates in diet.  Clinical follow-up in 8 to 10 months.   Medication Adjustments/Labs and Tests Ordered: Current medicines are reviewed at length with the patient today.  Concerns regarding medicines are outlined above.  Orders Placed This Encounter  Procedures   EKG 12-Lead   Meds ordered this encounter  Medications   sacubitril-valsartan (ENTRESTO) 49-51 MG    Sig: Take 1 tablet by mouth 2 (two) times daily.    Dispense:  180 tablet    Refill:  3   carvedilol (COREG) 12.5 MG tablet    Sig: Take 1 tablet (12.5 mg total) by mouth 2 (two) times daily.    Dispense:  180 tablet    Refill:  1    Dose change    There are no Patient Instructions on file  for this visit.   Signed, Sinclair Grooms, MD  01/14/2022 9:51 AM    [place]

## 2022-01-14 ENCOUNTER — Encounter: Payer: Self-pay | Admitting: Interventional Cardiology

## 2022-01-14 ENCOUNTER — Ambulatory Visit (INDEPENDENT_AMBULATORY_CARE_PROVIDER_SITE_OTHER): Payer: Managed Care, Other (non HMO) | Admitting: Interventional Cardiology

## 2022-01-14 ENCOUNTER — Other Ambulatory Visit: Payer: Self-pay

## 2022-01-14 VITALS — BP 118/88 | HR 45 | Ht 70.0 in | Wt 217.2 lb

## 2022-01-14 DIAGNOSIS — I441 Atrioventricular block, second degree: Secondary | ICD-10-CM

## 2022-01-14 DIAGNOSIS — I5042 Chronic combined systolic (congestive) and diastolic (congestive) heart failure: Secondary | ICD-10-CM

## 2022-01-14 DIAGNOSIS — Z952 Presence of prosthetic heart valve: Secondary | ICD-10-CM | POA: Diagnosis not present

## 2022-01-14 DIAGNOSIS — I970 Postcardiotomy syndrome: Secondary | ICD-10-CM

## 2022-01-14 DIAGNOSIS — I10 Essential (primary) hypertension: Secondary | ICD-10-CM | POA: Diagnosis not present

## 2022-01-14 DIAGNOSIS — E785 Hyperlipidemia, unspecified: Secondary | ICD-10-CM

## 2022-01-14 MED ORDER — ENTRESTO 49-51 MG PO TABS: 1.0000 | ORAL_TABLET | Freq: Two times a day (BID) | ORAL | 3 refills | Status: DC

## 2022-01-14 MED ORDER — CARVEDILOL 12.5 MG PO TABS: 12.5000 mg | ORAL_TABLET | Freq: Two times a day (BID) | ORAL | 1 refills | Status: DC

## 2022-01-14 MED ORDER — CARVEDILOL 6.25 MG PO TABS: 6.2500 mg | ORAL_TABLET | Freq: Two times a day (BID) | ORAL | 3 refills | Status: DC

## 2022-01-14 NOTE — Patient Instructions (Signed)
Medication Instructions:  1) DECREASE Carvedilol to 6.25mg  twice daily  *If you need a refill on your cardiac medications before your next appointment, please call your pharmacy*   Lab Work: None If you have labs (blood work) drawn today and your tests are completely normal, you will receive your results only by: [place] (if you have MyChart) OR A paper copy in the mail If you have any lab test that is abnormal or we need to change your treatment, we will call you to review the results.   Testing/Procedures: None   Follow-Up: At [facility], you and your health needs are our priority.  As part of our continuing mission to provide you with exceptional heart care, we have created designated Provider Care Teams.  These Care Teams include your primary Cardiologist (physician) and Advanced Practice Providers (APPs -  Physician Assistants and Nurse Practitioners) who all work together to provide you with the care you need, when you need it.  We recommend signing up for the patient portal called "MyChart".  Sign up information is provided on this After Visit Summary.  MyChart is used to connect with patients for Virtual Visits (Telemedicine).  Patients are able to view lab/test results, encounter notes, upcoming appointments, etc.  Non-urgent messages can be sent to your provider as well.   To learn more about what you can do with MyChart, go to [email or web address].    Your next appointment:   9 month(s)  The format for your next appointment:   In Person  Provider:   Sinclair Grooms, MD     Other Instructions

## 2022-01-21 ENCOUNTER — Telehealth: Payer: Self-pay | Admitting: Interventional Cardiology

## 2022-01-21 ENCOUNTER — Other Ambulatory Visit: Payer: Self-pay | Admitting: Interventional Cardiology

## 2022-01-21 MED ORDER — AMOXICILLIN 500 MG PO CAPS: ORAL_CAPSULE | ORAL | 2 refills | Status: DC

## 2022-01-21 NOTE — Telephone Encounter (Signed)
?*  STAT* If patient is at the pharmacy, call can be transferred to refill team. ? ? ?1. Which medications need to be refilled? (please list name of each medication and dose if known)  ?Amoxicillin  ? ?2. Which pharmacy/location (including street and city if local pharmacy) is medication to be sent to? ?[id number] - [place], [address] AT [place] [address] ? ?3. Do they need a 30 day or 90 day supply? 30 days ? ?Patient has to go to the dentist 02/03/22 for a routine cleaning and his current rx does not have any refills  ?

## 2022-03-18 ENCOUNTER — Other Ambulatory Visit: Payer: Self-pay | Admitting: Family Medicine

## 2022-03-18 DIAGNOSIS — I714 Abdominal aortic aneurysm, without rupture, unspecified: Secondary | ICD-10-CM

## 2022-03-29 ENCOUNTER — Other Ambulatory Visit: Payer: Self-pay | Admitting: Family Medicine

## 2022-03-29 DIAGNOSIS — N62 Hypertrophy of breast: Secondary | ICD-10-CM

## 2022-04-13 ENCOUNTER — Other Ambulatory Visit: Payer: Managed Care, Other (non HMO)

## 2022-05-20 ENCOUNTER — Ambulatory Visit (INDEPENDENT_AMBULATORY_CARE_PROVIDER_SITE_OTHER): Payer: Managed Care, Other (non HMO) | Admitting: Physician Assistant

## 2022-05-20 ENCOUNTER — Telehealth: Payer: Self-pay | Admitting: *Deleted

## 2022-05-20 DIAGNOSIS — Z01810 Encounter for preprocedural cardiovascular examination: Secondary | ICD-10-CM | POA: Diagnosis not present

## 2022-05-20 NOTE — Telephone Encounter (Signed)
Ok per pre op provider Jari Favre, [facility] add pt on for tele visit today as we have just been asked to provide cardiac clearance today for procedure Monday 05/23/22.Speaking with the /pt he tells me the dental procedure is 05/25/22. I informed him the DDS stated 05/23/22.

## 2022-05-20 NOTE — Progress Notes (Signed)
Virtual Visit via Telephone Note   Because of Eric [name]'s co-morbid illnesses, he is at least at moderate risk for complications without adequate follow up.  This format is felt to be most appropriate for this patient at this time.  The patient did not have access to video technology/had technical difficulties with video requiring transitioning to audio format only (telephone).  All issues noted in this document were discussed and addressed.  No physical exam could be performed with this format.  Please refer to the patient's chart for his consent to telehealth for [employer].  Evaluation Performed:  Preoperative cardiovascular risk assessment _____________   Date:  05/20/2022   Patient ID:  Eric [name], Eric [name] [birth year], MRN [id number] Patient Location:  Home Provider location:   Office  Primary Care Provider:  Elias Else, MD Primary Cardiologist:  Lesleigh Noe, MD  Chief Complaint / Patient Profile   66 y.o. y/o male with a h/o severe aortic regurgitation status post AVR 11/25/2020, chronic combined systolic and diastolic heart failure, primary hypertension, left ventricular hypertrophy, chronic kidney disease stage III, COVID-19 infection (September 2020), history of bilateral CAP versus post cardiotomy syndrome 01/22/2021 who is pending root canal and presents today for telephonic preoperative cardiovascular risk assessment.  Past Medical History    Past Medical History:  Diagnosis Date   Allergy    ALLERGIC RHINITIS..WORSE SPRING/FALL   Aortic valve regurgitation 07/10/14   ECHO @ [facility]   Arthritis    Chronic kidney disease    Stage 3 , followed by [facility]   GERD (gastroesophageal reflux disease)    CURRENTLY DIET CONTROLLED   Heart murmur    Hypertension    Pneumonia 06/2019   Sinusitis, chronic    Past Surgical History:  Procedure Laterality Date   AORTIC VALVE REPLACEMENT N/A 11/23/2020   Procedure: AORTIC VALVE REPLACEMENT (AVR) USING  EDWARDS INSPIRIS RESILIA 25 MM AORTIC VALVE;  Surgeon: Alleen Borne, MD;  Location: [facility] OR;  Service: Open Heart Surgery;  Laterality: N/A;   COLONOSCOPY     KNEE ARTHROSCOPY Bilateral    LASIK     TEE WITHOUT CARDIOVERSION N/A 11/23/2020   Procedure: TRANSESOPHAGEAL ECHOCARDIOGRAM (TEE);  Surgeon: Alleen Borne, MD;  Location: [facility] OR;  Service: Open Heart Surgery;  Laterality: N/A;    Allergies  Allergies  Allergen Reactions   Amlodipine Cough    History of Present Illness    Eric [name] is a 66 y.o. male who presents via audio/video conferencing for a telehealth visit today.  Pt was last seen in cardiology clinic on 01/14/2022 by Dr. Katrinka Blazing.  At that time Eric [name] was doing well .  The patient is now pending procedure as outlined above. Since his last visit, he has not had any new cardiovascular symptoms since his appointment in February.  He has been relatively active.  He has no troubles walking indoors and outdoors 1-2 blocks on level ground.  He also can climb stairs.  He does light and moderate housework and has a yard service.  He could mow the yard if necessary.  He is able to participate in moderate recreational activities.  His DASI was 6.36 which is well above minimal for METS required for surgical clearance.  He is not on any blood thinners.  We discussed taking amoxicillin at 2000 mg 1 hour before his dental procedure.  He does have amoxicillin at home that he can take.  There was a prescription sent  by Dr. Katrinka Blazing back in March.  Reports no shortness of breath nor dyspnea on exertion. Reports no chest pain, pressure, or tightness. No edema, orthopnea, PND. Reports no palpitations.     Home Medications    Prior to Admission medications   Medication Sig Start Date End Date Taking? Authorizing Provider  acetaminophen (TYLENOL) 500 MG tablet Take 1,000 mg by mouth every 6 (six) hours as needed (pain).    [provider]  allopurinol (ZYLOPRIM) 300 MG tablet Take  300 mg by mouth daily. 09/21/18   [provider]  amoxicillin (AMOXIL) 500 MG capsule Take 4 capsules (2000mg  total) by mouth 1 hour prior to dental work 01/21/22   03/23/22, MD  carvedilol (COREG) 6.25 MG tablet Take 1 tablet (6.25 mg total) by mouth 2 (two) times daily. 01/14/22   01/16/22, MD  rosuvastatin (CRESTOR) 10 MG tablet TAKE 1 TABLET(10 MG) BY MOUTH DAILY 12/07/21   12/09/21, MD  sacubitril-valsartan (ENTRESTO) 49-51 MG Take 1 tablet by mouth 2 (two) times daily. 01/14/22   01/16/22, MD    Physical Exam    Vital Signs:  Eric [name] does not have vital signs available for review today.  Given telephonic nature of communication, physical exam is limited. AAOx3. NAD. Normal affect.  Speech and respirations are unlabored.  Accessory Clinical Findings    None  Assessment & Plan    1.  Preoperative Cardiovascular Risk Assessment: Eric [name] perioperative risk of a major cardiac event is 0.9% according to the Revised Cardiac Risk Index (RCRI).  Therefore, he is at low risk for perioperative complications.   His functional capacity is good at 6.36 METs according to the [place] Activity Status Index (DASI). Recommendations: According to ACC/AHA guidelines, no further cardiovascular testing needed.  The patient may proceed to surgery at acceptable risk.     Please take amoxicillin at 2000 mg 1 hour before dental procedure.  Confirmed that patient does have medication.  A copy of this note will be routed to requesting surgeon.  Time:   Today, I have spent 10 minutes with the patient with telehealth technology discussing medical history, symptoms, and management plan.     Norvel Richards, PA-C  05/20/2022, 4:33 PM

## 2022-05-20 NOTE — Telephone Encounter (Signed)
   Pre-operative Risk Assessment    Patient Name: Eric [name]  DOB: May 02, 1956 MRN: [id number]     Request for Surgical Clearance     Procedure:  ROOT CANAL THERAPY   Date of Surgery:  Clearance 05/23/22                                 Surgeon:  DR. Karsten Fells R. MICKENS, DDS, PAC Surgeon's Group or Practice Name: DR. Karsten Fells R. Elliot Dally, DDS, PAC Phone number:  [phone] Fax number:  [phone]   Type of Clearance Requested:   - Medical ; NO MEDICATIONS ARE LISTED AS NEEDING TO BE HELD   Type of Anesthesia:  General    Additional requests/questions:    Elpidio Anis   05/20/2022, 4:14 PM

## 2022-06-01 ENCOUNTER — Other Ambulatory Visit: Payer: Self-pay | Admitting: Family Medicine

## 2022-06-01 ENCOUNTER — Ambulatory Visit
Admission: RE | Admit: 2022-06-01 | Discharge: 2022-06-01 | Disposition: A | Discharge status: Home / Self Care | Payer: Managed Care, Other (non HMO) | Source: Ambulatory Visit | Attending: Family Medicine | Admitting: Family Medicine

## 2022-06-01 DIAGNOSIS — I714 Abdominal aortic aneurysm, without rupture, unspecified: Secondary | ICD-10-CM

## 2022-06-01 MED ORDER — IOPAMIDOL (ISOVUE-370) INJECTION 76%
60.0000 mL | Freq: Once | INTRAVENOUS | Status: AC | PRN
  Administered 2022-06-01: 60 mL via INTRAVENOUS

## 2022-06-02 ENCOUNTER — Other Ambulatory Visit: Payer: Self-pay | Admitting: Family Medicine

## 2022-06-02 DIAGNOSIS — I712 Thoracic aortic aneurysm, without rupture, unspecified: Secondary | ICD-10-CM

## 2022-06-28 ENCOUNTER — Telehealth: Payer: Self-pay | Admitting: Interventional Cardiology

## 2022-06-28 NOTE — Telephone Encounter (Signed)
Pt c/o medication issue:  1. Name of Medication:   sacubitril-valsartan (ENTRESTO) 49-51 MG    2. How are you currently taking this medication (dosage and times per day)? As written  3. Are you having a reaction (difficulty breathing--STAT)? No   4. What is your medication issue? Pt states he has a cough and wants to speak with Dr. Michaelle Copas nurse about this medication. Please advise.

## 2022-06-28 NOTE — Telephone Encounter (Signed)
Patient states he has a chronic cough for the past year, states it is affecting his quality of life. He states he has been to "several doctors" to figure out what is causing the cough and still has no answer.  Patient states he's concerned cough may be caused by Sherryll Burger (started in March 2022). He had called in about this before in October 2022, attempt trial of stopping Entresto, spironolactone was started but patient states BP was too high and restarted Entresto.  Patient would like to know what else can be done. He said he did not notice any difference in cough when he was off the [place] in October but does not feel he was off of it long enough before BP increased and had to restart.  Will forward to Dr. Katrinka Blazing to review and advise.

## 2022-06-30 NOTE — Telephone Encounter (Signed)
Pt calling to F/U on call from the other day. He stated that he was suppose to be called once Dr. Katrinka Blazing was back in the office. Please advise

## 2022-07-01 NOTE — Telephone Encounter (Signed)
Patient is following up, again requesting a call back. He is hopeful to speak with someone before the weekend if at all possible.

## 2022-07-01 NOTE — Telephone Encounter (Signed)
Called patient and shared information from [place], RPH-CPP:  Eric [name] would be cleared from his system within a day or two. If he's been off it for a week and hasn't noticed resolution of his cough, it's not likely coming from the [place]. His EF improved from 40-45% to 60% after starting Entresto so ideally prefer for him to continue on therapy if possible, BP has also been high since holding the [place] too.  Patient states he wants to give it some time being off Entresto because the cough has been ongoing since March 2022 when he started [place] and he cannot tolerate it any longer. Patient denies any SOB, swelling, or weight gain.  Patient will callback on 07/06/22 when Dr. Katrinka Blazing is back in the office to get more direction.

## 2022-07-01 NOTE — Telephone Encounter (Signed)
Returned call to patient informing him Dr. Katrinka Blazing has not yet had a chance to review note regarding cough possibly related to [facility].  Patient states he has not taken any Entresto since Sunday August 6th. He states he has noted "some" improvement in cough. Patient asked how long it usually takes for [facility] to fully get out of the system. Informed patient I will reach out to Pharm D to advise on this.  Patient states he has been monitoring his BP at home with readings in the 140s/90s since being off of Entresto.

## 2022-07-01 NOTE — Telephone Encounter (Signed)
Eric [name] would be cleared from his system within a day or two. If he's been off it for a week and hasn't noticed resolution of his cough, it's not likely coming from the [place]. His EF improved from 40-45% to 60% after starting Entresto so ideally prefer for him to continue on therapy if possible, BP has also been high since holding the [place] too.

## 2022-07-06 NOTE — Telephone Encounter (Signed)
Discussed Dr. Michaelle Copas recommendation with patient: Okay to stay off therapy is cough resolves. May need to see ENT of pulmonary Medicine MD.   Patient states he has noticed improvement in cough since being off of Entresto. States he has had less phlegm which has helped, he said he will follow-up with ENT in the next month or two.  Patient reports BP (same on and off Entresto): Systolic in the 140s and 130s, diastolic in the 90s and 80s. HR averages mid-60s, sometimes 70s and 80s.  Patient asked if he needs to go back to taking 12.5mg  of Carvedilol for BP management while off of Entresto. Per OV note from 01/14/22 it was decreased to 6.25mg  BID due to bradycardia.  Will forward to Dr. Katrinka Blazing to review and advise.

## 2022-07-19 ENCOUNTER — Encounter: Payer: Self-pay | Admitting: Interventional Cardiology

## 2022-07-19 ENCOUNTER — Telehealth: Payer: Self-pay | Admitting: Interventional Cardiology

## 2022-07-19 DIAGNOSIS — I10 Essential (primary) hypertension: Secondary | ICD-10-CM

## 2022-07-19 NOTE — Telephone Encounter (Signed)
Left message to return call 

## 2022-07-19 NOTE — Telephone Encounter (Signed)
Follow Up:     Patient is returning a call from today. 

## 2022-07-19 NOTE — Telephone Encounter (Signed)
Patient is requesting to talk with Dr. Katrinka Blazing or nurse...please call back

## 2022-07-19 NOTE — Telephone Encounter (Signed)
Stopped Entresto on Aug 7 due to chronic cough.  The cough resolved in about a week.  Has been monitoring BP regularly 132-136/ 90-92. He has doubled Coreg to 12.5 mg twice daily. [place] Sp[place] this was his previous dose.  HRs have been 57-70.  He has very little sodium in diet.  Does not add salt.    I have not updated medication list.  Will wait for recommendations from Dr. Katrinka Blazing. The patient is aware we will call him back once we hear from Dr. Katrinka Blazing.  No other needs at this time.

## 2022-07-21 MED ORDER — SPIRONOLACTONE 25 MG PO TABS: 12.5000 mg | ORAL_TABLET | ORAL | 3 refills | Status: DC

## 2022-07-28 ENCOUNTER — Encounter: Payer: Self-pay | Admitting: Interventional Cardiology

## 2022-07-28 DIAGNOSIS — I10 Essential (primary) hypertension: Secondary | ICD-10-CM

## 2022-07-28 MED ORDER — HYDROCHLOROTHIAZIDE 12.5 MG PO CAPS: 12.5000 mg | ORAL_CAPSULE | Freq: Every day | ORAL | 11 refills | Status: DC

## 2022-08-05 ENCOUNTER — Ambulatory Visit: Payer: Managed Care, Other (non HMO) | Attending: Interventional Cardiology

## 2022-08-05 DIAGNOSIS — I10 Essential (primary) hypertension: Secondary | ICD-10-CM

## 2022-08-05 LAB — BASIC METABOLIC PANEL
BUN/Creatinine Ratio: 16 (ref 10–24)
BUN: 28 mg/dL — ABNORMAL HIGH (ref 8–27)
CO2: 23 mmol/L (ref 20–29)
Calcium: 9.6 mg/dL (ref 8.6–10.2)
Chloride: 102 mmol/L (ref 96–106)
Creatinine, Ser: 1.79 mg/dL — ABNORMAL HIGH (ref 0.76–1.27)
Glucose: 99 mg/dL (ref 70–99)
Potassium: 4.3 mmol/L (ref 3.5–5.2)
Sodium: 140 mmol/L (ref 134–144)
eGFR: 42 mL/min/{1.73_m2} — ABNORMAL LOW (ref >59)

## 2022-08-11 ENCOUNTER — Telehealth: Payer: Self-pay | Admitting: Interventional Cardiology

## 2022-08-11 NOTE — Telephone Encounter (Signed)
Patients states he left his bp reading with the labs on last Friday 9/14. They told him they will give them to dr nurse, but he states he hasnt heard anything. Wanted to make sure the nurse got them. Please advise

## 2022-08-11 NOTE — Telephone Encounter (Signed)
Spoke with pt and advised Dr Tamala Julian and his nurse are out of the office until next week but will forward message.  Offered to take BP readings today so they would be available in pt's chart but pt declines and states to just have nurse contact him next week.  Pt thanked Therapist, sports for the call.

## 2022-08-26 NOTE — Telephone Encounter (Signed)
Sent patient MyChart message notifying him Dr. Tamala Julian has reviewed his BP readings, no changes. Continue to monitor BP and notify our office if he consistently gets readings greater than 140/90 mmHg.

## 2022-08-31 MED ORDER — SPIRONOLACTONE 25 MG PO TABS: 25.0000 mg | ORAL_TABLET | Freq: Every day | ORAL | 3 refills | Status: DC

## 2022-08-31 NOTE — Addendum Note (Signed)
Addended by: Molli Barrows on: 08/31/2022 10:30 AM   Modules accepted: Orders

## 2022-08-31 NOTE — Addendum Note (Signed)
Addended by: Molli Barrows on: 08/31/2022 02:36 PM   Modules accepted: Orders

## 2022-09-02 NOTE — Addendum Note (Signed)
Addended by: Molli Barrows on: 09/02/2022 01:29 PM   Modules accepted: Orders

## 2022-09-02 NOTE — Addendum Note (Signed)
Addended by: Molli Barrows on: 09/02/2022 01:32 PM   Modules accepted: Orders

## 2022-09-16 ENCOUNTER — Ambulatory Visit: Payer: Managed Care, Other (non HMO) | Attending: Cardiovascular Disease

## 2022-09-16 DIAGNOSIS — I10 Essential (primary) hypertension: Secondary | ICD-10-CM

## 2022-09-16 LAB — BASIC METABOLIC PANEL
BUN/Creatinine Ratio: 15 (ref 10–24)
BUN: 29 mg/dL — ABNORMAL HIGH (ref 8–27)
CO2: 23 mmol/L (ref 20–29)
Calcium: 9.8 mg/dL (ref 8.6–10.2)
Chloride: 96 mmol/L (ref 96–106)
Creatinine, Ser: 1.96 mg/dL — ABNORMAL HIGH (ref 0.76–1.27)
Glucose: 89 mg/dL (ref 70–99)
Potassium: 4.9 mmol/L (ref 3.5–5.2)
Sodium: 134 mmol/L (ref 134–144)
eGFR: 37 mL/min/{1.73_m2} — ABNORMAL LOW (ref >59)

## 2022-09-19 ENCOUNTER — Telehealth: Payer: Self-pay

## 2022-09-19 DIAGNOSIS — I10 Essential (primary) hypertension: Secondary | ICD-10-CM

## 2022-09-19 NOTE — Telephone Encounter (Signed)
-----   Message from Belva Crome, MD sent at 09/17/2022  1:21 PM EDT ----- Let the patient know the creatinine bump up. Go back to 12.5 mg daily on Spironolactone. Start Hydralazine 25 mg PO BID. Will be able to up titrate for BP control. A copy will be sent to Stacie Glaze, DO

## 2022-09-19 NOTE — Telephone Encounter (Signed)
Spoke with patient and discussed lab results.  Per Dr. Tamala Julian: Let the patient know the creatinine bump up. Go back to 12.5 mg daily on Spironolactone. Start Hydralazine 25 mg PO BID. Will be able to up titrate for BP control.   Patient states he does not want to add any additional medications if possible. He asked if he started Hydralazine could he stop one of his other medications.  Patient is currently taking carvedilol 6.25mg  BID, HCTZ 12.5mg  QD, and spironolactone 25mg  QD.  Will forward to Dr. Tamala Julian to review and advise.

## 2022-09-21 NOTE — Telephone Encounter (Signed)
Hydralazine and Hctz are to help get BP under control. Carvedilol and Spironolactone are to help BP and keep heart strong.   Last BP recordings were not at target < 130/80 mmHg.  Enroll him in the Pharmacy BP clinic.

## 2022-09-22 MED ORDER — SPIRONOLACTONE 25 MG PO TABS: 12.5000 mg | ORAL_TABLET | Freq: Every day | ORAL | 3 refills | Status: DC

## 2022-09-22 MED ORDER — HYDRALAZINE HCL 25 MG PO TABS: 25.0000 mg | ORAL_TABLET | Freq: Two times a day (BID) | ORAL | 2 refills | Status: DC

## 2022-09-22 NOTE — Telephone Encounter (Signed)
Spoke with patient and discussed Dr. Thompson Caul recommendations:  Hydralazine and Hctz are to help get BP under control. Carvedilol and Spironolactone are to help BP and keep heart strong.    Last BP recordings were not at target < 130/80 mmHg.   Enroll him in the Pharmacy BP clinic.    Patient repeatedly stated he does not want to be on multiple medications for BP management. He states he will try taking Hydralazine 25mg  BID and 12.5mg  spironolactone daily until follow-up with BP Clinic. Hydralazine 25mg  QD sent to pharmacy, medication list updated.  Referral for BP Clinic ordered, scheduler to call and setup appt for patient in 2 weeks.  Patient verbalized understanding of the above.

## 2022-10-09 NOTE — Progress Notes (Unsigned)
Cardiology Office Note:    Date:  10/12/2022   ID:  Eric [name] 1956-09-28, MRN [id number]  PCP:  Elias Else, MD  Cardiologist:  Lesleigh Noe, MD   Referring MD: Elias Else, MD   Chief Complaint  Patient presents with   Cardiac Valve Problem   Congestive Heart Failure   Hypertension    History of Present Illness:    Eric [name] is a 66 y.o. 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   Medication intolerance ACEI/ARB/Entresto and amlodipine causing cough and congestion.  Enrolled in the pharmacy blood pressure clinic.  Because of intolerances his regimen now consists of HCTZ/spironolactone/carvedilol/and hydralazine.  Carvedilol dose was decreased in February because of sinus bradycardia in the 40s.  I had hoped to use an ARB/ACE because following aortic valve replacement, LVEF dropped into the low 40% range.  He was not symptomatic and did not develop heart failure.  His last echocardiogram was done while on Entresto and EF had increased to 55%.  Denies orthopnea, PND, lower extremity swelling, and dyspnea.  Past Medical History:  Diagnosis Date   Allergy    ALLERGIC RHINITIS..WORSE SPRING/FALL   Aortic valve regurgitation 07/10/14   ECHO @ [facility]   Arthritis    Chronic kidney disease    Stage 3 , followed by [facility]   GERD (gastroesophageal reflux disease)    CURRENTLY DIET CONTROLLED   Heart murmur    Hypertension    Pneumonia 06/2019   Sinusitis, chronic     Past Surgical History:  Procedure Laterality Date   AORTIC VALVE REPLACEMENT N/A 11/23/2020   Procedure: AORTIC VALVE REPLACEMENT (AVR) USING EDWARDS INSPIRIS RESILIA 25 MM AORTIC VALVE;  Surgeon: Alleen Borne, MD;  Location: [facility] OR;  Service: Open Heart Surgery;  Laterality: N/A;   COLONOSCOPY     KNEE ARTHROSCOPY Bilateral    LASIK     TEE WITHOUT CARDIOVERSION N/A 11/23/2020   Procedure: TRANSESOPHAGEAL ECHOCARDIOGRAM (TEE);  Surgeon: Alleen Borne, MD;  Location: [facility] OR;  Service: Open Heart Surgery;  Laterality: N/A;    Current Medications: Current Meds  Medication Sig   acetaminophen (TYLENOL) 500 MG tablet Take 1,000 mg by mouth every 6 (six) hours as needed (pain).   allopurinol (ZYLOPRIM) 300 MG tablet Take 300 mg by mouth daily. Pt takes 1/2 tablet 150 mg once a day.   amoxicillin (AMOXIL) 500 MG capsule Take 4 capsules (2000mg  total) by mouth 1 hour prior to dental work   carvedilol (COREG) 6.25 MG tablet Take 1 tablet (6.25 mg total) by mouth 2 (two) times daily.   cetirizine (ZYRTEC) 10 MG tablet Take 10 mg by mouth as needed for allergies.   fluticasone (FLONASE) 50 MCG/ACT nasal spray Place 2 sprays into both nostrils daily.   hydrALAZINE (APRESOLINE) 25 MG tablet Take 1 tablet (25 mg total) by mouth 2 (two) times daily.   hydrochlorothiazide (MICROZIDE) 12.5 MG capsule Take 1 capsule (12.5 mg total) by mouth daily.   rosuvastatin (CRESTOR) 10 MG tablet TAKE 1 TABLET(10 MG) BY MOUTH DAILY   spironolactone (ALDACTONE) 25 MG tablet Take 0.5 tablets (12.5 mg total) by mouth daily.     Allergies:   Amlodipine   Social History   Socioeconomic  History   Marital status: Married    Spouse name: [name]   Number of children: 3   Years of education: 18   Highest education level: Master's degree (e.g., MA, MS, MEng, MEd, MSW, MBA)  Occupational History   Occupation: Art gallery manager  Tobacco Use   Smoking status: Never   Smokeless tobacco: Never  Vaping Use   Vaping Use: Never used  Substance and Sexual Activity   Alcohol use: Yes    Alcohol/week: 2.0 standard drinks of alcohol    Types: 2 Standard drinks or equivalent per week    Comment: occasional   Drug use: No   Sexual  activity: Yes  Other Topics Concern   Not on file  Social History Narrative   Not on file   Social Determinants of Health   Financial Resource Strain: Low Risk  (07/08/2019)   Overall Financial Resource Strain (CARDIA)    Difficulty of Paying Living Expenses: Not hard at all  Food Insecurity: No Food Insecurity (07/08/2019)   Hunger Vital Sign    Worried About Running Out of Food in the Last Year: Never true    Ran Out of Food in the Last Year: Never true  Transportation Needs: No Transportation Needs (07/08/2019)   PRAPARE - Administrator, Civil Service (Medical): No    Lack of Transportation (Non-Medical): No  Physical Activity: Sufficiently Active (07/08/2019)   Exercise Vital Sign    Days of Exercise per Week: 3 days    Minutes of Exercise per Session: 60 min  Stress: No Stress Concern Present (07/08/2019)   [employer] of Occupational Health - Occupational Stress Questionnaire    Feeling of Stress : Not at all  Social Connections: Socially Integrated (07/08/2019)   Social Connection and Isolation Panel [NHANES]    Frequency of Communication with Friends and Family: Three times a week    Frequency of Social Gatherings with Friends and Family: Three times a week    Attends Religious Services: More than 4 times per year    Active Member of Clubs or Organizations: Yes    Attends Banker Meetings: 1 to 4 times per year    Marital Status: Married     Family History: The patient's family history includes Cancer in his brother, father, and maternal grandmother; Diabetes in his brother and daughter; Heart disease in his maternal grandmother; Hypertension in his mother.  ROS:   Please see the history of present illness.    No recurrence of cough.  Does not like having to take multiple medications.  All other systems reviewed and are negative.  EKGs/Labs/Other Studies Reviewed:    The following studies were reviewed today:  2D Doppler echocardiogram  07/2021 IMPRESSIONS   1. Hypokinesis of the mid and distal septum with overall preserved LV  function; LV function improved compared to 01/20/21.   2. Left ventricular ejection fraction, by estimation, is 55 to 60%. The  left ventricle has normal function. The left ventricle demonstrates  regional wall motion abnormalities (see scoring diagram/findings for  description). There is mild left ventricular   hypertrophy. Left ventricular diastolic parameters are consistent with  Grade I diastolic dysfunction (impaired relaxation). The average left  ventricular global longitudinal strain is -17.2 %. The global longitudinal  strain is normal.   3. Right ventricular systolic function is normal. The right ventricular  size is normal. There is normal pulmonary artery systolic pressure.   4. The mitral valve is normal in structure. Mild mitral valve  regurgitation.   5. The aortic valve has been repaired/replaced. Aortic valve  regurgitation is not visualized. There is a 25 mm Edwards Inspiris Resilia  valve present in the aortic position. Procedure Date: 11/23/20.   6. Aortic dilatation noted. There is mild dilatation of the aortic root,  measuring 39 mm. There is mild dilatation of the ascending aorta,  measuring 40 mm.   Comparison(s): 01/20/21 EF 40-45%. AV 16mmHg mean PG, 25mmHg peak PG.   EKG:  EKG last EKG demonstrated sinus bradycardia in the 45 bpm range.  Carvedilol dose was decreased from 12.5 mg twice daily to 6.25 mg  Recent Labs: 11/05/2021: ALT 25 09/16/2022: BUN 29; Creatinine, Ser 1.96; Potassium 4.9; Sodium 134  Recent Lipid Panel    Component Value Date/Time   CHOL 147 11/05/2021 1259   TRIG 83 11/05/2021 1259   HDL 52 11/05/2021 1259   CHOLHDL 2.8 11/05/2021 1259   LDLCALC 79 11/05/2021 1259    Physical Exam:    VS:  BP 112/74   Pulse 91   Ht 5\' 10"  (1.778 m)   Wt 224 lb 12.8 oz (102 kg)   SpO2 97%   BMI 32.26 kg/m     Wt Readings from Last 3 Encounters:  10/11/22  224 lb 12.8 oz (102 kg)  01/14/22 217 lb 3.2 oz (98.5 kg)  08/17/21 226 lb 3.2 oz (102.6 kg)     GEN: Overweight with BMI 32.. No acute distress HEENT: Normal NECK: No JVD. LYMPHATICS: No lymphadenopathy CARDIAC: Scratchy to to 3/6 systolic murmur along left mid sternal and right upper sternal locations. RRR no gallop, or edema. VASCULAR:  Normal Pulses. No bruits. RESPIRATORY:  Clear to auscultation without rales, wheezing or rhonchi  ABDOMEN: Soft, non-tender, non-distended, No pulsatile mass, MUSCULOSKELETAL: No deformity  SKIN: Warm and dry NEUROLOGIC:  Alert and oriented x 3 PSYCHIATRIC:  Normal affect   ASSESSMENT:    1. S/P AVR (aortic valve replacement)   2. Chronic combined systolic and diastolic heart failure (HCC)   3. Essential hypertension   4. Hyperlipidemia with target LDL less than 70   5. 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  Valve function was normal. LVEF improved from 40 to 45% to 50 to 55% on Entresto.  The medication had to be discontinued because of unacceptable side effects that included cough and congestion.  May need to consider additional therapy if EF has declined off Entresto.  For that reason an echo will be done in 5 or 6 months prior to his first visit with Dr. Raynelle Janhandrashekar.  I started hydralazine for blood pressure and we could consider BiDil therapy if needed.  An additional consideration would be to add an SGLT2 given his renal dysfunction and perhaps consider discontinuing hydrochlorothiazide. Blood pressure is finally under better control on the current regimen of carvedilol/hydralazine/HCTZ/Aldactone. Continue 10 mg/day Previous monitor demonstrated Mobitz 1 second-degree heart block.  Assigned to Dr. Ladona RidgelMahesh Chandrashekar with anticipated office visit in 4 to 6 months. 2D Doppler echocardiogram before follow-up with Dr.  Raynelle Janhandrashekar   Medication Adjustments/Labs and Tests Ordered: Current medicines are reviewed at length with the patient today.  Concerns regarding medicines are outlined above.  Orders Placed This Encounter  Procedures   ECHOCARDIOGRAM COMPLETE   No orders of the defined types were placed in this encounter.   Patient Instructions  Medication Instructions:  Your physician recommends that  you continue on your current medications as directed. Please refer to the Current Medication list given to you today.  *If you need a refill on your cardiac medications before your next appointment, please call your pharmacy*  Testing/Procedures: Your physician has requested that you have an echocardiogram. Echocardiography is a painless test that uses sound waves to create images of your heart. It provides your doctor with information about the size and shape of your heart and how well your heart's chambers and valves are working. This procedure takes approximately one hour. There are no restrictions for this procedure. Please do NOT wear cologne, perfume, aftershave, or lotions (deodorant is allowed). Please arrive 15 minutes prior to your appointment time.   Follow-Up: At [facility], you and your health needs are our priority.  As part of our continuing mission to provide you with exceptional heart care, we have created designated Provider Care Teams.  These Care Teams include your primary Cardiologist (physician) and Advanced Practice Providers (APPs -  Physician Assistants and Nurse Practitioners) who all work together to provide you with the care you need, when you need it.  Your next appointment:   6 month(s)  The format for your next appointment:   In Person  Provider:   Riley Lam, MD  Important Information About Sugar         Signed, Lesleigh Noe, MD  10/12/2022 8:41 AM    [facility]

## 2022-10-11 ENCOUNTER — Encounter: Payer: Self-pay | Admitting: Interventional Cardiology

## 2022-10-11 ENCOUNTER — Ambulatory Visit: Payer: Managed Care, Other (non HMO) | Attending: Interventional Cardiology | Admitting: Interventional Cardiology

## 2022-10-11 VITALS — BP 112/74 | HR 91 | Ht 70.0 in | Wt 224.8 lb

## 2022-10-11 DIAGNOSIS — I10 Essential (primary) hypertension: Secondary | ICD-10-CM

## 2022-10-11 DIAGNOSIS — E785 Hyperlipidemia, unspecified: Secondary | ICD-10-CM

## 2022-10-11 DIAGNOSIS — Z952 Presence of prosthetic heart valve: Secondary | ICD-10-CM

## 2022-10-11 DIAGNOSIS — I5042 Chronic combined systolic (congestive) and diastolic (congestive) heart failure: Secondary | ICD-10-CM

## 2022-10-11 DIAGNOSIS — I441 Atrioventricular block, second degree: Secondary | ICD-10-CM

## 2022-10-11 NOTE — Patient Instructions (Signed)
Medication Instructions:  Your physician recommends that you continue on your current medications as directed. Please refer to the Current Medication list given to you today.  *If you need a refill on your cardiac medications before your next appointment, please call your pharmacy*  Testing/Procedures: Your physician has requested that you have an echocardiogram. Echocardiography is a painless test that uses sound waves to create images of your heart. It provides your doctor with information about the size and shape of your heart and how well your heart's chambers and valves are working. This procedure takes approximately one hour. There are no restrictions for this procedure. Please do NOT wear cologne, perfume, aftershave, or lotions (deodorant is allowed). Please arrive 15 minutes prior to your appointment time.   Follow-Up: At [facility], you and your health needs are our priority.  As part of our continuing mission to provide you with exceptional heart care, we have created designated Provider Care Teams.  These Care Teams include your primary Cardiologist (physician) and Advanced Practice Providers (APPs -  Physician Assistants and Nurse Practitioners) who all work together to provide you with the care you need, when you need it.  Your next appointment:   6 month(s)  The format for your next appointment:   In Person  Provider:   Riley Lam, MD  Important Information About Sugar

## 2022-10-12 ENCOUNTER — Encounter: Payer: Self-pay | Admitting: Interventional Cardiology

## 2022-11-08 ENCOUNTER — Ambulatory Visit: Payer: Managed Care, Other (non HMO)

## 2022-12-19 ENCOUNTER — Other Ambulatory Visit: Payer: Self-pay | Admitting: *Deleted

## 2022-12-19 MED ORDER — HYDRALAZINE HCL 25 MG PO TABS: 25.0000 mg | ORAL_TABLET | Freq: Two times a day (BID) | ORAL | 1 refills | Status: DC

## 2022-12-19 MED ORDER — ROSUVASTATIN CALCIUM 10 MG PO TABS: ORAL_TABLET | ORAL | 1 refills | Status: DC

## 2023-02-09 ENCOUNTER — Other Ambulatory Visit: Payer: Self-pay | Admitting: *Deleted

## 2023-02-09 MED ORDER — CARVEDILOL 6.25 MG PO TABS: 6.2500 mg | ORAL_TABLET | Freq: Two times a day (BID) | ORAL | 3 refills | Status: DC

## 2023-03-17 ENCOUNTER — Ambulatory Visit (HOSPITAL_COMMUNITY): Payer: Managed Care, Other (non HMO) | Attending: Cardiology

## 2023-03-17 DIAGNOSIS — Z952 Presence of prosthetic heart valve: Secondary | ICD-10-CM | POA: Diagnosis not present

## 2023-03-17 DIAGNOSIS — I517 Cardiomegaly: Secondary | ICD-10-CM | POA: Diagnosis not present

## 2023-03-17 DIAGNOSIS — I10 Essential (primary) hypertension: Secondary | ICD-10-CM

## 2023-03-17 DIAGNOSIS — I77810 Thoracic aortic ectasia: Secondary | ICD-10-CM | POA: Diagnosis not present

## 2023-03-17 DIAGNOSIS — I5030 Unspecified diastolic (congestive) heart failure: Secondary | ICD-10-CM | POA: Diagnosis not present

## 2023-03-17 DIAGNOSIS — I081 Rheumatic disorders of both mitral and tricuspid valves: Secondary | ICD-10-CM

## 2023-03-17 LAB — ECHOCARDIOGRAM COMPLETE
AR max vel: 1.18 cm2
AV Area VTI: 1.16 cm2
AV Area mean vel: 1.19 cm2
AV Mean grad: 13.5 mmHg
AV Peak grad: 23.8 mmHg
Ao pk vel: 2.44 m/s
Area-P 1/2: 2.93 cm2
S' Lateral: 3.1 cm

## 2023-04-04 ENCOUNTER — Other Ambulatory Visit: Payer: Self-pay | Admitting: Internal Medicine

## 2023-04-07 ENCOUNTER — Encounter: Payer: Self-pay | Admitting: Internal Medicine

## 2023-04-07 ENCOUNTER — Ambulatory Visit: Payer: Managed Care, Other (non HMO) | Attending: Internal Medicine | Admitting: Internal Medicine

## 2023-04-07 VITALS — BP 108/80 | HR 94 | Ht 70.0 in | Wt 221.0 lb

## 2023-04-07 DIAGNOSIS — I77810 Thoracic aortic ectasia: Secondary | ICD-10-CM

## 2023-04-07 DIAGNOSIS — Z952 Presence of prosthetic heart valve: Secondary | ICD-10-CM

## 2023-04-07 DIAGNOSIS — I10 Essential (primary) hypertension: Secondary | ICD-10-CM

## 2023-04-07 DIAGNOSIS — I5042 Chronic combined systolic (congestive) and diastolic (congestive) heart failure: Secondary | ICD-10-CM | POA: Diagnosis not present

## 2023-04-07 DIAGNOSIS — I517 Cardiomegaly: Secondary | ICD-10-CM

## 2023-04-07 DIAGNOSIS — I441 Atrioventricular block, second degree: Secondary | ICD-10-CM | POA: Insufficient documentation

## 2023-04-07 DIAGNOSIS — N1831 Chronic kidney disease, stage 3a: Secondary | ICD-10-CM

## 2023-04-07 DIAGNOSIS — I351 Nonrheumatic aortic (valve) insufficiency: Secondary | ICD-10-CM

## 2023-04-07 MED ORDER — CARVEDILOL 3.125 MG PO TABS: 3.1250 mg | ORAL_TABLET | Freq: Two times a day (BID) | ORAL | 3 refills | Status: DC

## 2023-04-07 NOTE — Progress Notes (Signed)
Cardiology Office Note:    Date:  04/07/2023   ID:  [name], DOB 1956/03/01, MRN [id number]  PCP:  Elias Else, MD (Inactive)  Cardiologist:  Christell Constant, MD   Referring MD: Elias Else, MD   CC:  Transition to new cardiologist  History of Present Illness:    Eric [name] is a 67 y.o. 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 Tri-leaflet vavle.  2023: Medication intolerance ACEI/ARB/Entresto and amlodipine causing cough and congestion. Carvedilol at maximum dose due to sinus bradycardia; asymptomatic.  Patient notes that he is doing well.   Since last visit notes fatigue, but otherwise feeling well. There are no interval hospital/ED visit.    No chest pain or pressure .  No SOB/DOE and no PND/Orthopnea.  No weight gain or leg swelling.  No palpitations or syncope .    Past Medical History:  Diagnosis Date   Allergy    ALLERGIC RHINITIS..WORSE SPRING/FALL   Aortic valve regurgitation 07/10/14   ECHO @ [facility]   Arthritis    Chronic kidney disease    Stage 3 , followed by [facility]   GERD (gastroesophageal reflux disease)    CURRENTLY DIET CONTROLLED   Heart murmur    Hypertension    Pneumonia 06/2019   Sinusitis, chronic     Past Surgical History:  Procedure Laterality Date   AORTIC VALVE REPLACEMENT N/A 11/23/2020   Procedure: AORTIC VALVE REPLACEMENT (AVR) USING EDWARDS INSPIRIS RESILIA 25 MM AORTIC VALVE;  Surgeon: Alleen Borne, MD;  Location: [facility] OR;  Service: Open Heart Surgery;  Laterality: N/A;   COLONOSCOPY     KNEE ARTHROSCOPY Bilateral    LASIK      TEE WITHOUT CARDIOVERSION N/A 11/23/2020   Procedure: TRANSESOPHAGEAL ECHOCARDIOGRAM (TEE);  Surgeon: Alleen Borne, MD;  Location: [facility] OR;  Service: Open Heart Surgery;  Laterality: N/A;    Current Medications: Current Meds  Medication Sig   acetaminophen (TYLENOL) 500 MG tablet Take 1,000 mg by mouth every 6 (six) hours as needed (pain).   amoxicillin (AMOXIL) 500 MG capsule Take 4 capsules (2000mg  total) by mouth 1 hour prior to dental work   carvedilol (COREG) 3.125 MG tablet Take 1 tablet (3.125 mg total) by mouth 2 (two) times daily.   fluticasone (FLONASE) 50 MCG/ACT nasal spray Place 2 sprays into both nostrils daily.   hydrALAZINE (APRESOLINE) 25 MG tablet Take 1 tablet (25 mg total) by mouth 2 (two) times daily.   hydrochlorothiazide (MICROZIDE) 12.5 MG capsule Take 1 capsule (12.5 mg total) by mouth daily.   rosuvastatin (CRESTOR) 10 MG tablet TAKE 1 TABLET(10 MG) BY MOUTH DAILY   [DISCONTINUED] carvedilol (COREG) 6.25 MG tablet Take 1 tablet (6.25 mg total) by mouth 2 (two) times daily.     Allergies:   Amlodipine and Sacubitril-valsartan   Social History   Socioeconomic History   Marital status: Married    Spouse name: [name]   Number of children: 3   Years of education: 18   Highest education level: Master's degree (e.g., MA, MS, MEng, MEd, MSW, MBA)  Occupational History   Occupation: Art gallery manager  Tobacco Use   Smoking status: Never   Smokeless  tobacco: Never  Vaping Use   Vaping Use: Never used  Substance and Sexual Activity   Alcohol use: Yes    Alcohol/week: 2.0 standard drinks of alcohol    Types: 2 Standard drinks or equivalent per week    Comment: occasional   Drug use: No   Sexual activity: Yes  Other Topics Concern   Not on file  Social History Narrative   Not on file   Social Determinants of Health   Financial Resource Strain: Low Risk  (07/08/2019)   Overall Financial Resource Strain (CARDIA)    Difficulty of Paying Living Expenses: Not hard at  all  Food Insecurity: No Food Insecurity (07/08/2019)   Hunger Vital Sign    Worried About Running Out of Food in the Last Year: Never true    Ran Out of Food in the Last Year: Never true  Transportation Needs: No Transportation Needs (07/08/2019)   PRAPARE - Administrator, Civil Service (Medical): No    Lack of Transportation (Non-Medical): No  Physical Activity: Sufficiently Active (07/08/2019)   Exercise Vital Sign    Days of Exercise per Week: 3 days    Minutes of Exercise per Session: 60 min  Stress: No Stress Concern Present (07/08/2019)   [employer] of Occupational Health - Occupational Stress Questionnaire    Feeling of Stress : Not at all  Social Connections: Socially Integrated (07/08/2019)   Social Connection and Isolation Panel [NHANES]    Frequency of Communication with Friends and Family: Three times a week    Frequency of Social Gatherings with Friends and Family: Three times a week    Attends Religious Services: More than 4 times per year    Active Member of Clubs or Organizations: Yes    Attends Banker Meetings: 1 to 4 times per year    Marital Status: Married     Family History: The patient's family history includes Cancer in his brother, father, and maternal grandmother; Diabetes in his brother and daughter; Heart disease in his maternal grandmother; Hypertension in his mother.  ROS:   Please see the history of present illness.     EKGs/Labs/Other Studies Reviewed:    The following studies were reviewed today:  Cardiac Studies & Procedures       ECHOCARDIOGRAM  ECHOCARDIOGRAM COMPLETE 03/17/2023  Narrative ECHOCARDIOGRAM REPORT    Patient Name:   Eric [name] Date of Exam: 03/17/2023 Medical Rec #:  [id number]     Height:       70.0 in Accession #:    [id number]    Weight:       224.8 lb Date of Birth:  Apr 08, 1956     BSA:          2.194 m Patient Age:    66 years      BP:           112/74 mmHg Patient Gender: M              HR:           69 bpm. Exam Location:  [address]  Procedure: 2D Echo, Cardiac Doppler, Color Doppler and Strain Analysis  Indications:    I10 Hypertension  History:        Patient has prior history of Echocardiogram examinations, most recent 01/20/2021. CHF, S/p AVR (25mm Edwards Inspiris Resilia); Risk Factors:Hypertension and Dyslipidemia. Aortic Valve: 25 mm Edwards Inspiris Resilia valve is present in the aortic position. Procedure Date: 11/23/20.  Sonographer:  Samule Ohm RDCS Referring Phys: [id number] Barry Dienes [facility]  IMPRESSIONS   1. Left ventricular ejection fraction, by estimation, is 60 to 65%. The left ventricle has normal function. The left ventricle has no regional wall motion abnormalities. There is mild left ventricular hypertrophy. Left ventricular diastolic parameters are consistent with Grade I diastolic dysfunction (impaired relaxation). 2. Right ventricular systolic function is normal. The right ventricular size is normal. 3. The mitral valve is normal in structure. Mild mitral valve regurgitation. No evidence of mitral stenosis. 4. The aortic valve has been repaired/replaced. Aortic valve regurgitation is not visualized. No aortic stenosis is present. There is a 25 mm Edwards Inspiris Resilia valve present in the aortic position. Procedure Date: 11/23/20. Echo findings are consistent with normal structure and function of the aortic valve prosthesis. 5. Aortic dilatation noted. There is mild dilatation of the aortic root, measuring 40 mm. There is mild dilatation of the ascending aorta, measuring 42 mm. 6. The inferior vena cava is normal in size with greater than 50% respiratory variability, suggesting right atrial pressure of 3 mmHg.  FINDINGS Left Ventricle: Left ventricular ejection fraction, by estimation, is 60 to 65%. The left ventricle has normal function. The left ventricle has no regional wall motion abnormalities. The left ventricular internal cavity size  was normal in size. There is mild left ventricular hypertrophy. Left ventricular diastolic parameters are consistent with Grade I diastolic dysfunction (impaired relaxation).  Right Ventricle: The right ventricular size is normal. Right ventricular systolic function is normal.  Left Atrium: Left atrial size was normal in size.  Right Atrium: Right atrial size was normal in size.  Pericardium: There is no evidence of pericardial effusion.  Mitral Valve: The mitral valve is normal in structure. Mild mitral valve regurgitation. No evidence of mitral valve stenosis.  Tricuspid Valve: The tricuspid valve is normal in structure. Tricuspid valve regurgitation is mild . No evidence of tricuspid stenosis.  Aortic Valve: The aortic valve has been repaired/replaced. Aortic valve regurgitation is not visualized. No aortic stenosis is present. Aortic valve mean gradient measures 13.5 mmHg. Aortic valve peak gradient measures 23.8 mmHg. Aortic valve area, by VTI measures 1.16 cm. There is a 25 mm Edwards Inspiris Resilia valve present in the aortic position. Procedure Date: 11/23/20. Echo findings are consistent with normal structure and function of the aortic valve prosthesis.  Pulmonic Valve: The pulmonic valve was normal in structure. Pulmonic valve regurgitation is trivial. No evidence of pulmonic stenosis.  Aorta: Aortic dilatation noted. There is mild dilatation of the aortic root, measuring 40 mm. There is mild dilatation of the ascending aorta, measuring 42 mm.  Venous: The inferior vena cava is normal in size with greater than 50% respiratory variability, suggesting right atrial pressure of 3 mmHg.  IAS/Shunts: No atrial level shunt detected by color flow Doppler.   LEFT VENTRICLE PLAX 2D LVIDd:         4.80 cm   Diastology LVIDs:         3.10 cm   LV e' medial:    7.07 cm/s LV PW:         1.20 cm   LV E/e' medial:  11.5 LV IVS:        1.20 cm   LV e' lateral:   8.27 cm/s LVOT diam:      2.00 cm   LV E/e' lateral: 9.8 LV SV:         60 LV SV Index:   27  2D Longitudinal Strain LVOT Area:     3.14 cm  2D Strain GLS (A2C):   15.6 % 2D Strain GLS (A3C):   13.6 % 2D Strain GLS (A4C):   14.4 % 2D Strain GLS Avg:     14.5 %  RIGHT VENTRICLE            IVC RV S prime:     9.68 cm/s  IVC diam: 1.10 cm TAPSE (M-mode): 1.3 cm RVSP:           26.8 mmHg  LEFT ATRIUM             Index        RIGHT ATRIUM           Index LA diam:        4.30 cm 1.96 cm/m   RA Pressure: 3.00 mmHg LA Vol (A2C):   54.9 ml 25.03 ml/m  RA Area:     16.80 cm LA Vol (A4C):   69.0 ml 31.45 ml/m  RA Volume:   44.70 ml  20.38 ml/m LA Biplane Vol: 63.6 ml 28.99 ml/m AORTIC VALVE AV Area (Vmax):    1.18 cm AV Area (Vmean):   1.19 cm AV Area (VTI):     1.16 cm AV Vmax:           244.00 cm/s AV Vmean:          172.500 cm/s AV VTI:            0.513 m AV Peak Grad:      23.8 mmHg AV Mean Grad:      13.5 mmHg LVOT Vmax:         91.80 cm/s LVOT Vmean:        65.200 cm/s LVOT VTI:          0.190 m LVOT/AV VTI ratio: 0.37  AORTA Ao Root diam: 4.00 cm Ao Asc diam:  4.20 cm  MITRAL VALVE               TRICUSPID VALVE MV Area (PHT): 2.93 cm    TR Peak grad:   23.8 mmHg MV Decel Time: 259 msec    TR Vmax:        244.00 cm/s MV E velocity: 81.20 cm/s  Estimated RAP:  3.00 mmHg MV A velocity: 98.50 cm/s  RVSP:           26.8 mmHg MV E/A ratio:  0.82 SHUNTS Systemic VTI:  0.19 m Systemic Diam: 2.00 cm  Olga Millers MD Electronically signed by Olga Millers MD Signature Date/Time: 03/17/2023/11:03:20 AM    Final   TEE  ECHO INTRAOPERATIVE TEE 11/23/2020  Narrative *INTRAOPERATIVE TRANSESOPHAGEAL REPORT *    Patient Name:   TRITON ALTICE  Date of Exam: 11/23/2020 Medical Rec #:  [id number]      Height:       70.0 in Accession #:    [id number]     Weight:       220.5 lb Date of Birth:  1956/01/01      BSA:          2.18 m Patient Age:    64 years       BP:           147/61  mmHg Patient Gender: M              HR:           70 bpm. Exam Location:  Anesthesiology  Transesophogeal exam  was perform intraoperatively during surgical procedure. Patient was closely monitored under general anesthesia during the entirety of examination.  Indications:     Aortic valve disease Sonographer:     Renella Cunas RDCS Performing Phys: [id number] Alleen Borne Diagnosing Phys: Jairo Ben MD  Complications: No known complications during this procedure. POST-OP IMPRESSIONS Limited Post CPB exam: The patient separated easily from CPB with low dose Dopamine infusion support. - Left Ventricle: The left ventricle function is essentially unchanged from pre-bypass images. The overall EF remains approx 40%, with mild global hypokinesis. There are no regional wall motion abnormalities. The LV is no longer dilated. - Aortic Valve: A pericardial bioprosthetic valve was placed, leaflets are freely mobile. There is no aortic regurgitation or perivalvular leak post repair. The gradient recorded across the prosthetic valve is within the expected range, with no stenosis: peak gradient 5 mmHg, mean gradient 3 mmHg. - Mitral Valve: The mitral valve appears unchanged from pre-bypass images. There is trivial MR. - Tricuspid Valve: The tricuspid valve appears unchanged from pre-bypass images. Mild tricuspid regurgitation remains.  PRE-OP FINDINGS Left Ventricle: The left ventricle has mild-moderately reduced systolic function, with an ejection fraction of 40-45%, calculated 43.5%. The cavity size was moderately dilated. There is no increase in left ventricular wall thickness. Left ventrical has mild-mod global hypokinesis without regional wall motion abnormalities.  Right Ventricle: The right ventricle has normal systolic function. The cavity was normal. There is no increase in right ventricular wall thickness. Right ventricular systolic pressure is normal.  Left Atrium: Left atrial size was  normal in size. The left atrial appendage is well visualized and there is no evidence of thrombus present. Left atrial appendage velocity is normal at greater than 40 cm/s.  Right Atrium: Right atrial size was normal in size. Catheter present in the right atrium.  Interatrial Septum: No atrial level shunt detected by color flow Doppler.  Pericardium: There is no evidence of pericardial effusion.  Mitral Valve: The mitral valve is normal in structure. No thickening of the mitral valve leaflets. No calcification of the mitral valve leaflet. Mitral valve regurgitation is trivial by color flow Doppler. There is no evidence of mitral valve vegetation. Pulmonary venous flow is normal. There is no evidence of mitral stenosis, with peak gradient 3 mmHg, mean gradient 2 mmHg.  Tricuspid Valve: The tricuspid valve was normal in structure. Tricuspid valve regurgitation is mild by color flow Doppler. The jet is directed toward the atrial septum. There is no evidence of tricuspid valve vegetation.  Aortic Valve: The aortic valve is tricuspid. There is mild-mod thickening of the aortic valve leaflet tips. The leaflets do not coapt well. Aortic valve regurgitation is moderate to severe by color flow Doppler. The jet is anteriorly-directed, hugging the anterior lealflet of the mitral valve. There is no aortic stenosis, with peak gradient 6 mmHg, mean gradient 3 mmHg. There is no evidence of a vegetation on the aortic valve.  Pulmonic Valve: The pulmonic valve was normal in structure, with normal leaflet excursion. No evidence of pulmonic stenosis. Pulmonic valve regurgitation is trivial by color flow Doppler.   Aorta: The aortic root, ascending aorta and aortic arch appear normal in size and structure. The LVOT measures 2.5 cm, Sinus ov Valsalva 3.8 cm, S-T ridge 3.2 cm. There is evidence of scattered plaque in the descending aorta; Grade I, measuring 1-72mm in size.  Pulmonary Artery: Theone Murdoch catheter  present on the right. The pulmonary artery is of normal size.  Venous: The inferior  vena cava was not well visualized.  +-------------+--------++ AORTIC VALVE          +-------------+--------++ AV Mean Grad:3.0 mmHg +-------------+--------++  +-------------+--------++ MITRAL VALVE          +-------------+--------++ MV Mean grad:2.0 mmHg +-------------+--------++   Jairo Ben MD Electronically signed by Jairo Ben MD Signature Date/Time: 11/23/2020/2:30:49 PM    Final   MONITORS  LONG TERM MONITOR (3-14 DAYS) 01/03/2021  Narrative  Basic rhythm is NSR with 1 degree AVB  Occasional asymptomatic Mobitz 1 second degree AVB  PVC's with burden ~ 3 %  Rare PAC's  No atrial fibrillation or other significant arrhythmia  Abnormal study with asymptomatic 2 degree AVB and PVC's with 3% burden.      Patch Wear Time:  6 days and 20 hours (2022-01-26T10:30:01-499 to 2022-02-02T07:11:25-0500)  Patient had a min HR of 33 bpm, max HR of 119 bpm, and avg HR of 83 bpm. Predominant underlying rhythm was Sinus Rhythm. First Degree AV Block was present. Second Degree AV Block-Mobitz I (Wenckebach) was present. Isolated SVEs were rare (<1.0%), SVE Couplets were rare (<1.0%), and SVE Triplets were rare (<1.0%). Isolated VEs were occasional (2.9%, [id number]), VE Couplets were rare (<1.0%, 319), and VE Triplets were rare (<1.0%, 23). Ventricular Bigeminy and Trigeminy were present.   CT SCANS  CT CORONARY MORPH W/CTA COR W/SCORE 10/28/2020  Addendum 10/28/2020  1:18 PM ADDENDUM REPORT: 10/28/2020 13:15  CLINICAL DATA:  67 year old male with h/o severe aortic insufficiency scheduled for AVR.  EXAM: Cardiac/Coronary  CTA  TECHNIQUE: The patient was scanned on a [employer].  FINDINGS: A 100 kV prospective scan was triggered in the descending thoracic aorta at 111 HU's. Axial non-contrast 3 mm slices were carried out through the heart. The data  set was analyzed on a dedicated work station and scored using the Agatson method. Gantry rotation speed was 250 msecs and collimation was .6 mm. No beta blockade and 0.8 mg of sl NTG was given. The 3D data set was reconstructed in 5% intervals of the 67-82 % of the R-R cycle. Diastolic phases were analyzed on a dedicated work station using MPR, MIP and VRT modes. The patient received 80 cc of contrast.  Aorta: Upper normal size of the ascending aorta with maximum diameter 40 mm. Minimal atherosclerotic plaque and calcifications. No dissection.  Sinotubular Junction: 34 x 34 mm  Ascending Thoracic Aorta: 40 x 39 mm  Aortic Arch: 31 x 29 mm  Descending Thoracic Aorta: 30 x 39 mm  Sinus of Valsalva Measurements:  Non-coronary: 37 mm  Right -coronary: 36 mm  Left -coronary: 36 mm  Aortic Valve: Trileaflet with thickened leaflets and central non-coaptation. No calcifications.  Coronary Arteries:  Normal coronary origin.  Right dominance.  RCA is a large dominant artery that gives rise to PDA and PLA. There are only minimal irregularities.  Left main is a large artery that gives rise to LAD and LCX arteries. Left main has only luminal irregularities.  LAD is a large vessel that gives rise to two large and one small diagonal arteries. Proximal LAD has minimal eccentric calcified plaque with stenosis 0-25%.  D1,2,3 have no significant plaque.  LCX is a non-dominant artery that gives rise to one small OM1 branch. There is no plaque.  Other findings:  Normal pulmonary vein drainage into the left atrium.  Normal left atrial appendage without a thrombus.  Dilated pulmonary artery measuring 35 mm suggestive of pulmonary hypertension.  IMPRESSION: 1. Coronary calcium score of  32. This was 92 percentile for age and sex matched control.  2. Normal coronary origin with right dominance.  3. CAD-RADS 1. 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 Consider preventive therapy and risk factor modification.  4. Upper normal size of the ascending aorta with maximum diameter 40 mm. Minimal atherosclerotic plaque and calcifications. No dissection.  5. Aortic Valve: Trileaflet with thickened leaflets and central non-coaptation. No calcifications.   Electronically Signed By: Tobias Alexander On: 10/28/2020 13:15  Narrative EXAM: OVER-READ INTERPRETATION  CT CHEST  The following report is an over-read performed by radiologist Dr. Trudie Reed of [facility] on 10/23/2020. This over-read does not include interpretation of cardiac or coronary anatomy or pathology. The coronary calcium score/coronary CTA interpretation by the cardiologist is attached.  COMPARISON:  None.  FINDINGS: Extracardiac findings will be described separately under dictation for contemporaneously obtained CTA chest.  IMPRESSION: Please see separate dictation for contemporaneously obtained CTA chest dated 10/23/2020 for full description of relevant extracardiac findings.  Electronically Signed: By: Trudie Reed M.D. On: 10/24/2020 06:45          EKG:   04/07/2023: SR 1st HB rate 63  Recent Labs: 09/16/2022: BUN 29; Creatinine, Ser 1.96; Potassium 4.9; Sodium 134  Recent Lipid Panel    Component Value Date/Time   CHOL 147 11/05/2021 1259   TRIG 83 11/05/2021 1259   HDL 52 11/05/2021 1259   CHOLHDL 2.8 11/05/2021 1259   LDLCALC 79 11/05/2021 1259    Physical Exam:    VS:  BP 108/80   Pulse 94   Ht 5\' 10"  (1.778 m)   Wt 221 lb (100.2 kg)   SpO2 (!) 63%   BMI 31.71 kg/m     Wt Readings from Last 3 Encounters:  04/07/23 221 lb (100.2 kg)  10/11/22 224 lb 12.8 oz (102 kg)  01/14/22 217 lb 3.2 oz (98.5 kg)    GEN:  No acute distress HEENT: Normal NECK: No JVD. CARDIAC: Soft systolic murmur with standing only, cannot Valsalva, . RRR no gallop, or edema. VASCULAR:  Normal Pulses. No  bruits. RESPIRATORY:  Clear to auscultation without rales, wheezing or rhonchi  ABDOMEN: Soft, non-tender, non-distended, No pulsatile mass, MUSCULOSKELETAL: No deformity  SKIN: Warm and dry NEUROLOGIC:  Alert and oriented x 3 PSYCHIATRIC:  Normal affect   ASSESSMENT:    1. Essential hypertension   2. Chronic combined systolic and diastolic heart failure (HCC)   3. S/P AVR (aortic valve replacement)   4. Dilated aortic root (HCC)   5. Nonrheumatic aortic valve insufficiency   6. Mobitz (type) I (Wenckebach's) atrioventricular block   7. LVH (left ventricular hypertrophy)     PLAN:    Heart failure with recovered LVEF HTN LVH with papillary muscle hypertrophy  - NYHA I - controlled on hydralazine 25 mg PO TID and HCTZ - he stop MRA; will keep off due to hypotension - would not start SGLT2i or ARNI; his LVEF has recovered s/p valve surgery - if f/u echo shows further hypertrophy in short axis will get CMR (septal thickness 15 mm and papillary muscle hypertrophy)  Tri-leafelt AV valve AI s/p AVR (edwards Resilia 2022) Mild aortic dilation - echo in one year (02/2024)  Mobitz Type I HB - BB as above if fatigue persistes may stop entirely as long as BP is controlled  1 year me or APP    Medication Adjustments/Labs and Tests Ordered: Current medicines are reviewed at length  with the patient today.  Concerns regarding medicines are outlined above.  Orders Placed This Encounter  Procedures   EKG 12-Lead   ECHOCARDIOGRAM COMPLETE   Meds ordered this encounter  Medications   carvedilol (COREG) 3.125 MG tablet    Sig: Take 1 tablet (3.125 mg total) by mouth 2 (two) times daily.    Dispense:  180 tablet    Refill:  3    Dose change (decreased).    Patient Instructions  Medication Instructions:  Your physician has recommended you make the following change in your medication:   1) DECREASE carvedilol (Coreg) to 3.125mg  twice daily 2) STOP spironolactone (this has been  removed from your medication list)  *If you need a refill on your cardiac medications before your next appointment, please call your pharmacy*  Lab Work: None ordered today.  Testing/Procedures: Your physician has requested that you have an echocardiogram in April 2025. Echocardiography is a painless test that uses sound waves to create images of your heart. It provides your doctor with information about the size and shape of your heart and how well your heart's chambers and valves are working. This procedure takes approximately one hour. There are no restrictions for this procedure. Please do NOT wear cologne, perfume, aftershave, or lotions (deodorant is allowed). Please arrive 15 minutes prior to your appointment time.   Follow-Up: At [facility], you and your health needs are our priority.  As part of our continuing mission to provide you with exceptional heart care, we have created designated Provider Care Teams.  These Care Teams include your primary Cardiologist (physician) and Advanced Practice Providers (APPs -  Physician Assistants and Nurse Practitioners) who all work together to provide you with the care you need, when you need it.  Your next appointment:   1 year(s)  The format for your next appointment:   In Person  Provider:   Christell Constant, MD  or Jari Favre, PA-C, Robin Searing, NP, Eligha Bridegroom, NP, or Tereso Newcomer, PA-C    Signed, Christell Constant, MD  04/07/2023 9:24 AM    [facility]

## 2023-04-07 NOTE — Patient Instructions (Addendum)
Medication Instructions:  Your physician has recommended you make the following change in your medication:   1) DECREASE carvedilol (Coreg) to 3.125mg  twice daily 2) STOP spironolactone (this has been removed from your medication list)  *If you need a refill on your cardiac medications before your next appointment, please call your pharmacy*  Lab Work: None ordered today.  Testing/Procedures: Your physician has requested that you have an echocardiogram in April 2025. Echocardiography is a painless test that uses sound waves to create images of your heart. It provides your doctor with information about the size and shape of your heart and how well your heart's chambers and valves are working. This procedure takes approximately one hour. There are no restrictions for this procedure. Please do NOT wear cologne, perfume, aftershave, or lotions (deodorant is allowed). Please arrive 15 minutes prior to your appointment time.   Follow-Up: At [facility], you and your health needs are our priority.  As part of our continuing mission to provide you with exceptional heart care, we have created designated Provider Care Teams.  These Care Teams include your primary Cardiologist (physician) and Advanced Practice Providers (APPs -  Physician Assistants and Nurse Practitioners) who all work together to provide you with the care you need, when you need it.  Your next appointment:   1 year(s)  The format for your next appointment:   In Person  Provider:   Christell Constant, MD  or Jari Favre, PA-C, Robin Searing, NP, Eligha Bridegroom, NP, or Tereso Newcomer, PA-C

## 2023-06-17 ENCOUNTER — Other Ambulatory Visit: Payer: Self-pay | Admitting: Internal Medicine

## 2023-07-25 ENCOUNTER — Other Ambulatory Visit: Payer: Self-pay

## 2023-07-25 MED ORDER — HYDROCHLOROTHIAZIDE 12.5 MG PO CAPS: 12.5000 mg | ORAL_CAPSULE | Freq: Every day | ORAL | 8 refills | Status: DC

## 2023-08-12 ENCOUNTER — Other Ambulatory Visit: Payer: Self-pay | Admitting: Internal Medicine

## 2023-09-08 ENCOUNTER — Encounter: Payer: Self-pay | Admitting: Family Medicine

## 2023-09-14 ENCOUNTER — Other Ambulatory Visit: Payer: Self-pay | Admitting: Family Medicine

## 2023-09-14 DIAGNOSIS — N183 Chronic kidney disease, stage 3 unspecified: Secondary | ICD-10-CM

## 2023-09-14 DIAGNOSIS — I7121 Aneurysm of the ascending aorta, without rupture: Secondary | ICD-10-CM

## 2023-10-12 ENCOUNTER — Encounter: Payer: Self-pay | Admitting: Family Medicine

## 2023-10-22 ENCOUNTER — Ambulatory Visit
Admission: RE | Admit: 2023-10-22 | Discharge: 2023-10-22 | Disposition: A | Discharge status: Home / Self Care | Payer: Managed Care, Other (non HMO) | Source: Ambulatory Visit | Attending: Family Medicine | Admitting: Family Medicine

## 2023-10-22 DIAGNOSIS — I7121 Aneurysm of the ascending aorta, without rupture: Secondary | ICD-10-CM

## 2023-10-22 DIAGNOSIS — N183 Chronic kidney disease, stage 3 unspecified: Secondary | ICD-10-CM

## 2023-10-22 MED ORDER — GADOPICLENOL 0.5 MMOL/ML IV SOLN
10.0000 mL | Freq: Once | INTRAVENOUS | Status: AC | PRN
  Administered 2023-10-22: 10 mL via INTRAVENOUS

## 2023-11-01 ENCOUNTER — Encounter: Payer: Self-pay | Admitting: Surgery

## 2023-11-01 ENCOUNTER — Institutional Professional Consult (permissible substitution) (INDEPENDENT_AMBULATORY_CARE_PROVIDER_SITE_OTHER): Payer: Managed Care, Other (non HMO) | Admitting: Surgery

## 2023-11-01 VITALS — BP 148/103 | HR 76 | Resp 18 | Ht 70.0 in | Wt 226.0 lb

## 2023-11-01 DIAGNOSIS — I7121 Aneurysm of the ascending aorta, without rupture: Secondary | ICD-10-CM

## 2023-11-01 NOTE — Progress Notes (Signed)
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  The aortic root and ascending aorta were minimally enlarged at that time with a maximum diameter of the ascending aorta on CT scan of 4.0 cm.  I did not feel there was any indication for replacement of his aorta at that time.  I saw him back after surgery and he recovered well.  He was followed by Dr. Katrinka Blazing until his retirement and is now followed by Dr. Izora Ribas.  He was seen in follow-up in our office by one of the PAs in February 2022 and was recovering well and released to be followed by cardiology and his PCP.  He had a follow-up echocardiogram in April 2024 showing normal prosthetic valvular function with a mean gradient of 13.5 mmHg and no evidence of paravalvular leak or regurgitation.  The aortic root was measured at 4.0 cm and the ascending aorta at 4.2 cm.  He recently saw his PCP Dr. Tracie Harrier and was sent for a follow-up MRA of the chest since it had been 2 years since his surgery.  This showed the ascending aorta to measure 4.1 x 4.2 cm at the level of the right pulmonary artery with a sinotubular junction of 3.6 cm.  He continues to feel well overall without chest pain or shortness of breath.  Current Outpatient Medications  Medication Sig Dispense Refill   acetaminophen (TYLENOL) 500 MG tablet Take 1,000 mg by mouth every 6 (six) hours as needed (pain).     amoxicillin (AMOXIL) 500 MG capsule Take 4 capsules (2000mg  total) by mouth 1 hour prior to dental work 12 capsule 2   carvedilol (COREG) 3.125 MG tablet Take 1 tablet (3.125 mg total) by mouth 2 (two) times daily. 180 tablet 3   hydrALAZINE (APRESOLINE) 25 MG tablet TAKE 1 TABLET(25 MG) BY MOUTH TWICE DAILY 180 tablet 1   hydrochlorothiazide (MICROZIDE) 12.5 MG capsule Take 1 capsule (12.5 mg  total) by mouth daily. 30 capsule 8   rosuvastatin (CRESTOR) 10 MG tablet TAKE 1 TABLET(10 MG) BY MOUTH DAILY 90 tablet 6   fluticasone (FLONASE) 50 MCG/ACT nasal spray Place 2 sprays into both nostrils daily.     No current facility-administered medications for this visit.     Physical Exam:  BP (!) 148/103   Pulse 76   Resp 18   Ht 5\' 10"  (1.778 m)   Wt 226 lb (102.5 kg)   SpO2 94% Comment: RA  BMI 32.43 kg/m  He looks well. Cardiac exam shows a regular rate and rhythm with a 2/6 systolic flow murmur along the right sternal border.  There is no diastolic murmur. Lung exam is clear. There is no peripheral edema.  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 History of chronic kidney disease.   EXAM: MRA CHEST WITH OR WITHOUT CONTRAST   TECHNIQUE: Angiographic images of the chest were obtained using MRA technique with intravenous contrast.   CONTRAST:  10 cc Vueway   COMPARISON:  Chest CT-06/01/2022; 01/22/2021; 10/23/2020   VASCULAR FINDINGS: VASCULAR FINDINGS Sequela of previous median sternotomy and open aortic valve replacement.   Stable mild fusiform aneurysmal dilatation of the ascending thoracic aorta was measurements as follows. The thoracic aorta tapers to a normal caliber at the level of the aortic arch. No evidence  of thoracic aortic dissection or perivascular stranding on this nongated examination.   Bovine configuration of the aortic arch. The branch vessels of the aortic arch appear patent throughout their imaged courses.   Normal heart size.  No pericardial effusion.   Although this examination was not tailored for the evaluation the pulmonary arteries, there are no discrete filling defects within the central pulmonary arterial tree to suggest central pulmonary embolism. 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    -------------------------------------------------------------   Thoracic aortic measurements:   SINOTUBULAR JUNCTION: 36 mm as measured in greatest oblique short axis coronal dimension.   PROXIMAL ASCENDING THORACIC AORTA: 41 mm as measured in greatest oblique short axis axial dimension (axial image 32, series 12) at the level of the main pulmonary artery and approximately 42 mm as measured in greatest oblique short axis coronal dimension (sagittal image 46, series 14), unchanged compared to the 10/2020 examination by my direct remeasurement.   AORTIC ARCH: 31 mm as measured in greatest oblique short axis sagittal dimension.   PROXIMAL DESCENDING THORACIC AORTA: 30 mm as measured in greatest oblique short axis axial dimension at the level of the main pulmonary artery.   DISTAL DESCENDING THORACIC AORTA: 30 mm as measured in greatest oblique short axis axial dimension at the level of the diaphragmatic hiatus.   Review of the MIP images confirms the above findings.   -------------------------------------------------------------   Non-Vascular Findings:   Mediastinum/Lymph Nodes: No bulky mediastinal, hilar or axillary lymphadenopathy.   Lungs/Pleura: No focal airspace opacities.  No pleural effusion.   Upper abdomen: Limited early arterial phase evaluation of the upper abdomen redemonstrates a at least 5.1 cm nonenhancing cyst arising from the superior pole of the right kidney, unchanged. There are 2 punctate (subcentimeter) T2 intense lesions within the liver, too small to accurately characterize though favored to represent hepatic cysts.   Musculoskeletal: Symmetric bilateral gynecomastia. Regional soft tissues appear otherwise normal. No acute or aggressive osseous abnormalities.   IMPRESSION: 1. Stable mild fusiform aneurysmal dilatation of the ascending thoracic aorta measuring up to 42 mm in diameter, unchanged compared to the 10/2020 examination by my direct  remeasurement. Recommend annual imaging followup by CTA or MRA. This recommendation follows 2010 ACCF/AHA/AATS/ACR/ASA/SCA/SCAI/SIR/STS/SVM Guidelines for the Diagnosis and Management of Patients with Thoracic Aortic Disease. Circulation. 2010; 121: X324-M010. Aortic aneurysm NOS (ICD10-I71.9) 2. 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 Further evaluation with cardiac echo could be performed as indicated.     Electronically Signed   By: Simonne Come M.D.   On: 10/23/2023 08:26      Impression:  He has a mildly enlarged aortic root at 4.0 cm and ascending aorta at 4.2 cm which are essentially unchanged from when he had his aortic valve replacement surgery in January 2022.  This does not require any intervention.  I reviewed the MRA images with him and answered all of his questions.  I stressed the importance of continued good blood pressure control in preventing further enlargement and acute aortic dissection.  I advised him against doing any heavy lifting that may require a Valsalva maneuver and could suddenly raise his blood pressure to high levels.   Plan:  He will continue to follow-up with his PCP and cardiology.  I will plan to see him back in 2 years with a CTA of the chest for aortic  surveillance.  I spent 15 minutes performing this established patient evaluation and > 50% of this time was spent face to face counseling and coordinating the care of this patient's aortic aneurysm.    Alleen Borne, MD [facility] [phone]

## 2023-11-09 ENCOUNTER — Encounter: Payer: Self-pay | Admitting: Internal Medicine

## 2023-11-09 DIAGNOSIS — I10 Essential (primary) hypertension: Secondary | ICD-10-CM

## 2023-11-10 ENCOUNTER — Other Ambulatory Visit (HOSPITAL_COMMUNITY): Payer: Self-pay

## 2023-11-10 MED ORDER — HYDROCHLOROTHIAZIDE 25 MG PO TABS
25.0000 mg | ORAL_TABLET | Freq: Every day | ORAL | 3 refills | Status: DC
  Filled 2023-11-10: qty 90, 90d supply, fill #0

## 2023-11-10 NOTE — Telephone Encounter (Signed)
BMP order placed and released for future draw.

## 2023-11-14 ENCOUNTER — Other Ambulatory Visit (HOSPITAL_COMMUNITY): Payer: Self-pay

## 2023-11-14 MED ORDER — HYDROCHLOROTHIAZIDE 25 MG PO TABS: 25.0000 mg | ORAL_TABLET | Freq: Every day | ORAL | 3 refills | Status: DC

## 2023-11-14 NOTE — Addendum Note (Signed)
Addended by: Margaret Pyle D on: 11/14/2023 11:19 AM   Modules accepted: Orders

## 2023-11-20 ENCOUNTER — Other Ambulatory Visit (HOSPITAL_COMMUNITY): Payer: Self-pay

## 2023-11-24 LAB — BASIC METABOLIC PANEL
BUN/Creatinine Ratio: 13 (ref 10–24)
BUN: 24 mg/dL (ref 8–27)
CO2: 27 mmol/L (ref 20–29)
Calcium: 9.7 mg/dL (ref 8.6–10.2)
Chloride: 98 mmol/L (ref 96–106)
Creatinine, Ser: 1.88 mg/dL — ABNORMAL HIGH (ref 0.76–1.27)
Glucose: 75 mg/dL (ref 70–99)
Potassium: 4.3 mmol/L (ref 3.5–5.2)
Sodium: 141 mmol/L (ref 134–144)
eGFR: 39 mL/min/{1.73_m2} — ABNORMAL LOW (ref >59)

## 2023-12-15 ENCOUNTER — Other Ambulatory Visit (HOSPITAL_COMMUNITY): Payer: Self-pay

## 2023-12-23 ENCOUNTER — Other Ambulatory Visit: Payer: Self-pay | Admitting: Internal Medicine

## 2024-01-15 ENCOUNTER — Telehealth: Payer: Self-pay | Admitting: Internal Medicine

## 2024-01-15 ENCOUNTER — Other Ambulatory Visit: Payer: Self-pay

## 2024-01-15 MED ORDER — AMOXICILLIN 500 MG PO CAPS: ORAL_CAPSULE | ORAL | 0 refills | Status: AC

## 2024-01-15 NOTE — Telephone Encounter (Signed)
*  STAT* If patient is at the pharmacy, call can be transferred to refill team.   1. Which medications need to be refilled? (please list name of each medication and dose if known) amoxicillin (AMOXIL) 500 MG capsule    2. Would you like to learn more about the convenience, safety, & potential cost savings by using the [facility]?    3. Are you open to using the [facility] (Type [facility].  ).   4. Which pharmacy/location (including street and city if local pharmacy) is medication to be sent to?  [id number] - [place], [place] - [address] AT [address]    5. Do they need a 30 day or 90 day supply?  Take 4 capsules (2000mg  total) by mouth 1 hour prior to dental work

## 2024-02-20 ENCOUNTER — Ambulatory Visit (HOSPITAL_COMMUNITY): Payer: Managed Care, Other (non HMO) | Attending: Cardiovascular Disease

## 2024-02-20 DIAGNOSIS — I361 Nonrheumatic tricuspid (valve) insufficiency: Secondary | ICD-10-CM

## 2024-02-20 DIAGNOSIS — I77810 Thoracic aortic ectasia: Secondary | ICD-10-CM | POA: Insufficient documentation

## 2024-02-20 LAB — ECHOCARDIOGRAM COMPLETE
AR max vel: 1.63 cm2
AV Area VTI: 1.82 cm2
AV Area mean vel: 1.58 cm2
AV Mean grad: 11.4 mmHg
AV Peak grad: 20.4 mmHg
Ao pk vel: 2.26 m/s
Area-P 1/2: 3.08 cm2
S' Lateral: 2.9 cm

## 2024-02-21 ENCOUNTER — Other Ambulatory Visit: Payer: Self-pay

## 2024-02-21 DIAGNOSIS — I77810 Thoracic aortic ectasia: Secondary | ICD-10-CM

## 2024-03-30 ENCOUNTER — Other Ambulatory Visit: Payer: Self-pay | Admitting: Internal Medicine

## 2024-04-29 ENCOUNTER — Ambulatory Visit: Attending: Internal Medicine | Admitting: Internal Medicine

## 2024-04-29 VITALS — BP 122/80 | HR 75 | Ht 70.0 in | Wt 222.8 lb

## 2024-04-29 DIAGNOSIS — I10 Essential (primary) hypertension: Secondary | ICD-10-CM

## 2024-04-29 DIAGNOSIS — I5042 Chronic combined systolic (congestive) and diastolic (congestive) heart failure: Secondary | ICD-10-CM | POA: Diagnosis not present

## 2024-04-29 DIAGNOSIS — I517 Cardiomegaly: Secondary | ICD-10-CM

## 2024-04-29 DIAGNOSIS — I77810 Thoracic aortic ectasia: Secondary | ICD-10-CM | POA: Diagnosis not present

## 2024-04-29 DIAGNOSIS — I351 Nonrheumatic aortic (valve) insufficiency: Secondary | ICD-10-CM

## 2024-04-29 DIAGNOSIS — Z952 Presence of prosthetic heart valve: Secondary | ICD-10-CM | POA: Diagnosis not present

## 2024-04-29 DIAGNOSIS — I441 Atrioventricular block, second degree: Secondary | ICD-10-CM

## 2024-04-29 NOTE — Progress Notes (Signed)
 Cardiology Office Note:    Date:  04/29/2024   ID:  Eric [name], DOB Apr 19, 1956, MRN [id number]  PCP:  Eric Gander, MD  Cardiologist:  Eric Melody, MD   Referring MD: Eric Graven, MD   CC:  S/p AVR f/u  History of Present Illness:    Eric [name] is a 68 y.o. 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 Tri-leaflet vavle.  2023: Medication intolerance ACEI/ARB/Entresto  and amlodipine  causing cough and congestion. 2024: Carvedilol  at maximum dose due to sinus bradycardia; asymptomatic.  Eric [name] is a 68 year old male with heart failure with reduced ejection fraction who presents for a routine follow-up. He was previously a patient of Doctor Eric [name].  He has a history of heart failure with reduced ejection fraction and concentric left ventricular hypertrophy. No current chest pain, breathing difficulties, or irregular heartbeats. Blood pressure is well-controlled on the current medication regimen.  He has chronic kidney disease stage 3B and has experienced medication intolerance to ACE inhibitors, ARBs, ARNIs, and amlodipine , which previously caused cough and congestion.  He underwent aortic valve replacement surgery in January 2022 and currently has mild aortic dilation and regurgitation. An echocardiogram was performed on February 20, 2024, showing stable findings.  He maintains an active lifestyle, engaging in regular exercise, and reports no recent dental work aside from routine cleanings.    Past Medical History:  Diagnosis Date   Allergy    ALLERGIC RHINITIS..WORSE SPRING/FALL    Aortic valve regurgitation 07/10/14   ECHO @ [facility]   Arthritis    Chronic kidney disease    Stage 3 , followed by [facility]   GERD (gastroesophageal reflux disease)    CURRENTLY DIET CONTROLLED   Heart murmur    Hypertension    Pneumonia 06/2019   Sinusitis, chronic     Past Surgical History:  Procedure Laterality Date   AORTIC VALVE REPLACEMENT N/A 11/23/2020   Procedure: AORTIC VALVE REPLACEMENT (AVR) USING EDWARDS INSPIRIS RESILIA 25 MM AORTIC VALVE;  Surgeon: Eric Lightning, MD;  Location: [facility] OR;  Service: Open Heart Surgery;  Laterality: N/A;   COLONOSCOPY     KNEE ARTHROSCOPY Bilateral    LASIK     TEE WITHOUT CARDIOVERSION N/A 11/23/2020   Procedure: TRANSESOPHAGEAL ECHOCARDIOGRAM (TEE);  Surgeon: Eric Lightning, MD;  Location: [facility] OR;  Service: Open Heart Surgery;  Laterality: N/A;    Current Medications: Current Meds  Medication Sig   acetaminophen  (TYLENOL ) 500 MG tablet Take 1,000 mg by mouth every 6 (six) hours as needed (pain).   amoxicillin  (AMOXIL ) 500 MG capsule Take 4 capsules (2000mg  total) by mouth 1 hour prior to dental work   carvedilol  (COREG ) 3.125 MG tablet Take 1 tablet (3.125 mg total) by mouth 2 (two) times daily.   fluticasone (FLONASE) 50 MCG/ACT nasal spray Place 2 sprays into both nostrils daily.   hydrALAZINE  (APRESOLINE ) 25 MG tablet Take 1 tablet (25 mg total) by mouth 2 (two) times daily.   hydrochlorothiazide  (HYDRODIURIL ) 25 MG tablet Take 1 tablet (25 mg total) by mouth daily.   rosuvastatin  (CRESTOR ) 10 MG tablet TAKE 1 TABLET(10 MG) BY MOUTH DAILY   testosterone cypionate (DEPOTESTOSTERONE  CYPIONATE) 200 MG/ML injection Inject 200 mg into the muscle every 21 ( twenty-one) days.     Allergies:   Amlodipine  and Sacubitril-valsartan    Social History   Socioeconomic History   Marital status: Married    Spouse name: Eric [name]   Number of children: 3   Years of education: 18   Highest education level: Master's degree (e.g., MA, MS,  MEng, MEd, MSW, MBA)  Occupational History   Occupation: Art gallery manager  Tobacco Use   Smoking status: Never   Smokeless tobacco: Never  Vaping Use   Vaping status: Never Used  Substance and Sexual Activity   Alcohol use: Yes    Alcohol/week: 2.0 standard drinks of alcohol    Types: 2 Standard drinks or equivalent per week    Comment: occasional   Drug use: No   Sexual activity: Yes  Other Topics Concern   Not on file  Social History Narrative   Not on file   Social Drivers of Health   Financial Resource Strain: Low Risk  (07/08/2019)   Overall Financial Resource Strain (CARDIA)    Difficulty of Paying Living Expenses: Not hard at all  Food Insecurity: No Food Insecurity (07/08/2019)   Hunger Vital Sign    Worried About Running Out of Food in the Last Year: Never true    Ran Out of Food in the Last Year: Never true  Transportation Needs: No Transportation Needs (07/08/2019)   PRAPARE - Administrator, Civil Service (Medical): No    Lack of Transportation (Non-Medical): No  Physical Activity: Sufficiently Active (07/08/2019)   Exercise Vital Sign    Days of Exercise per Week: 3 days    Minutes of Exercise per Session: 60 min  Stress: No Stress Concern Present (07/08/2019)   [employer] of Occupational Health - Occupational Stress Questionnaire    Feeling of Stress : Not at all  Social Connections: Socially Integrated (07/08/2019)   Social Connection and Isolation Panel [NHANES]    Frequency of Communication with Friends and Family: Three times a week    Frequency of Social Gatherings with Friends and Family: Three times a week    Attends Religious Services: More than 4 times per year    Active Member of Clubs or Organizations: Yes    Attends Banker Meetings: 1 to 4 times per year    Marital Status: Married     Family History: The patient's family history includes Cancer in his brother, father, and maternal grandmother; Diabetes in his brother and  daughter; Heart disease in his maternal grandmother; Hypertension in his mother.  ROS:   Please see the history of present illness.     EKGs/Labs/Other Studies Reviewed:    The following studies were reviewed today:  Cardiac Studies & Procedures   ______________________________________________________________________________________________     ECHOCARDIOGRAM  ECHOCARDIOGRAM COMPLETE 02/20/2024  Narrative ECHOCARDIOGRAM REPORT    Patient Name:   Eric [name] Date of Exam: 02/20/2024 Medical Rec #:  [id number]     Height:       70.0 in Accession #:    [id number]    Weight:       226.0 lb Date of Birth:  11/24/1955     BSA:          2.199 m Patient Age:    67 years      BP:           142/94 mmHg Patient Gender: M  HR:           76 bpm. Exam Location:  [address]  Procedure: 2D Echo, Cardiac Doppler and Color Doppler (Both Spectral and Color Flow Doppler were utilized during procedure).  Indications:    I77.810 Dilated Aortic Root Z95.2 Aortic Valve Replacement  History:        Patient has prior history of Echocardiogram examinations, most recent 03/17/2023. AVR-25 mm Inspiris Resilia., Signs/Symptoms:Murmur; Risk Factors:Hypertension.  Sonographer:    Brigid Canada RDCS Referring Phys: [id number] [facility] A Karron Alvizo  IMPRESSIONS   1. Left ventricular ejection fraction, by estimation, is 60 to 65%. The left ventricle has normal function. The left ventricle has no regional wall motion abnormalities. Left ventricular diastolic parameters are consistent with Grade I diastolic dysfunction (impaired relaxation). 2. Right ventricular systolic function is normal. The right ventricular size is normal. There is normal pulmonary artery systolic pressure. The estimated right ventricular systolic pressure is 23.8 mmHg. 3. Left atrial size was mildly dilated. 4. The mitral valve is normal in structure. Trivial mitral valve regurgitation. No evidence of mitral  stenosis. 5. The aortic valve has been repaired/replaced. Aortic valve regurgitation is not visualized. Echo findings are consistent with normal structure and function of the aortic valve prosthesis. Aortic valve mean gradient measures 11.4 mmHg. Aortic valve Vmax measures 2.26 m/s. Aortic valve acceleration time measures 77 msec. 6. There is borderline dilatation of the aortic root, measuring 39 mm. There is mild dilatation of the ascending aorta, measuring 40 mm. 7. The inferior vena cava is normal in size with greater than 50% respiratory variability, suggesting right atrial pressure of 3 mmHg.  FINDINGS Left Ventricle: Left ventricular ejection fraction, by estimation, is 60 to 65%. The left ventricle has normal function. The left ventricle has no regional wall motion abnormalities. The left ventricular internal cavity size was normal in size. There is no left ventricular hypertrophy. Left ventricular diastolic parameters are consistent with Grade I diastolic dysfunction (impaired relaxation).  Right Ventricle: The right ventricular size is normal. No increase in right ventricular wall thickness. Right ventricular systolic function is normal. There is normal pulmonary artery systolic pressure. The tricuspid regurgitant velocity is 2.28 m/s, and with an assumed right atrial pressure of 3 mmHg, the estimated right ventricular systolic pressure is 23.8 mmHg.  Left Atrium: Left atrial size was mildly dilated.  Right Atrium: Right atrial size was normal in size.  Pericardium: There is no evidence of pericardial effusion.  Mitral Valve: The mitral valve is normal in structure. Trivial mitral valve regurgitation. No evidence of mitral valve stenosis.  Tricuspid Valve: The tricuspid valve is normal in structure. Tricuspid valve regurgitation is mild . No evidence of tricuspid stenosis.  The aortic valve has been repaired/replaced. Aortic valve regurgitation is not visualized. Aortic valve area, by  VTI measures 1.82 cm. Echo findings are consistent with normal structure and function of the aortic valve prosthesis. Pulmonic Valve: The pulmonic valve was not well visualized. Pulmonic valve regurgitation is not visualized. No evidence of pulmonic stenosis.  Aorta: The aortic root is normal in size and structure. There is borderline dilatation of the aortic root, measuring 39 mm. There is mild dilatation of the ascending aorta, measuring 40 mm.  Venous: The inferior vena cava is normal in size with greater than 50% respiratory variability, suggesting right atrial pressure of 3 mmHg.  IAS/Shunts: No atrial level shunt detected by color flow Doppler.   LEFT VENTRICLE PLAX 2D LVIDd:         4.60  cm   Diastology LVIDs:         2.90 cm   LV e' medial:    7.56 cm/s LV PW:         1.10 cm   LV E/e' medial:  8.2 LV IVS:        1.10 cm   LV e' lateral:   7.32 cm/s LVOT diam:     2.24 cm   LV E/e' lateral: 8.5 LV SV:         76 LV SV Index:   35 LVOT Area:     3.95 cm   RIGHT VENTRICLE             IVC RV Basal diam:  3.70 cm     IVC diam: 0.90 cm RV S prime:     11.33 cm/s TAPSE (M-mode): 1.6 cm  LEFT ATRIUM             Index        RIGHT ATRIUM           Index LA diam:        4.60 cm 2.09 cm/m   RA Area:     16.10 cm LA Vol (A2C):   41.7 ml 18.97 ml/m  RA Volume:   42.90 ml  19.51 ml/m LA Vol (A4C):   36.3 ml 16.51 ml/m LA Biplane Vol: 39.1 ml 17.78 ml/m AORTIC VALVE AV Area (Vmax):    1.63 cm AV Area (Vmean):   1.58 cm AV Area (VTI):     1.82 cm AV Vmax:           225.60 cm/s AV Vmean:          157.200 cm/s AV VTI:            0.419 m AV Peak Grad:      20.4 mmHg AV Mean Grad:      11.4 mmHg LVOT Vmax:         93.00 cm/s LVOT Vmean:        62.900 cm/s LVOT VTI:          0.193 m LVOT/AV VTI ratio: 0.46  AORTA Ao Root diam: 3.90 cm Ao Asc diam:  4.00 cm  MITRAL VALVE               TRICUSPID VALVE MV Area (PHT): 3.08 cm    TR Peak grad:   20.8 mmHg MV Decel Time:  247 msec    TR Vmax:        228.00 cm/s MV E velocity: 62.30 cm/s MV A velocity: 91.35 cm/s  SHUNTS MV E/A ratio:  0.68        Systemic VTI:  0.19 m Systemic Diam: 2.24 cm  Karyl Paget Croitoru MD Electronically signed by Luana Rumple MD Signature Date/Time: 02/20/2024/4:46:31 PM    Final   TEE  ECHO INTRAOPERATIVE TEE 11/23/2020  Narrative *INTRAOPERATIVE TRANSESOPHAGEAL REPORT *    Patient Name:   Eric [name]  Date of Exam: 11/23/2020 Medical Rec #:  [id number]      Height:       70.0 in Accession #:    [id number]     Weight:       220.5 lb Date of Birth:  [birth year]      BSA:          2.18 m Patient Age:    64 years       BP:  147/61 mmHg Patient Gender: M              HR:           70 bpm. Exam Location:  Anesthesiology  Transesophogeal exam was perform intraoperatively during surgical procedure. Patient was closely monitored under general anesthesia during the entirety of examination.  Indications:     Aortic valve disease Sonographer:     Joannie Muff RDCS Performing Phys: [id number] Eric [name] Diagnosing Phys: Jonne Netters MD  Complications: No known complications during this procedure. POST-OP IMPRESSIONS Limited Post CPB exam: The patient separated easily from CPB with low dose Dopamine  infusion support. - Left Ventricle: The left ventricle function is essentially unchanged from pre-bypass images. The overall EF remains approx 40%, with mild global hypokinesis. There are no regional wall motion abnormalities. The LV is no longer dilated. - Aortic Valve: A pericardial bioprosthetic valve was placed, leaflets are freely mobile. There is no aortic regurgitation or perivalvular leak post repair. The gradient recorded across the prosthetic valve is within the expected range, with no stenosis: peak gradient 5 mmHg, mean gradient 3 mmHg. - Mitral Valve: The mitral valve appears unchanged from pre-bypass images. There is trivial MR. - Tricuspid Valve: The  tricuspid valve appears unchanged from pre-bypass images. Mild tricuspid regurgitation remains.  PRE-OP FINDINGS Left Ventricle: The left ventricle has mild-moderately reduced systolic function, with an ejection fraction of 40-45%, calculated 43.5%. The cavity size was moderately dilated. There is no increase in left ventricular wall thickness. Left ventrical has mild-mod global hypokinesis without regional wall motion abnormalities.  Right Ventricle: The right ventricle has normal systolic function. The cavity was normal. There is no increase in right ventricular wall thickness. Right ventricular systolic pressure is normal.  Left Atrium: Left atrial size was normal in size. The left atrial appendage is well visualized and there is no evidence of thrombus present. Left atrial appendage velocity is normal at greater than 40 cm/s.  Right Atrium: Right atrial size was normal in size. Catheter present in the right atrium.  Interatrial Septum: No atrial level shunt detected by color flow Doppler.  Pericardium: There is no evidence of pericardial effusion.  Mitral Valve: The mitral valve is normal in structure. No thickening of the mitral valve leaflets. No calcification of the mitral valve leaflet. Mitral valve regurgitation is trivial by color flow Doppler. There is no evidence of mitral valve vegetation. Pulmonary venous flow is normal. There is no evidence of mitral stenosis, with peak gradient 3 mmHg, mean gradient 2 mmHg.  Tricuspid Valve: The tricuspid valve was normal in structure. Tricuspid valve regurgitation is mild by color flow Doppler. The jet is directed toward the atrial septum. There is no evidence of tricuspid valve vegetation.  Aortic Valve: The aortic valve is tricuspid. There is mild-mod thickening of the aortic valve leaflet tips. The leaflets do not coapt well. Aortic valve regurgitation is moderate to severe by color flow Doppler. The jet is anteriorly-directed, hugging the  anterior lealflet of the mitral valve. There is no aortic stenosis, with peak gradient 6 mmHg, mean gradient 3 mmHg. There is no evidence of a vegetation on the aortic valve.  Pulmonic Valve: The pulmonic valve was normal in structure, with normal leaflet excursion. No evidence of pulmonic stenosis. Pulmonic valve regurgitation is trivial by color flow Doppler.   Aorta: The aortic root, ascending aorta and aortic arch appear normal in size and structure. The LVOT measures 2.5 cm, Sinus ov Valsalva 3.8 cm, S-T ridge 3.2  cm. There is evidence of scattered plaque in the descending aorta; Grade I, measuring 1-82mm in size.  Pulmonary Artery: Harlo Ligas catheter present on the right. The pulmonary artery is of normal size.  Venous: The inferior vena cava was not well visualized.  +-------------+--------++ AORTIC VALVE          +-------------+--------++ AV Mean Grad:3.0 mmHg +-------------+--------++  +-------------+--------++ MITRAL VALVE          +-------------+--------++ MV Mean grad:2.0 mmHg +-------------+--------++   Jonne Netters MD Electronically signed by Jonne Netters MD Signature Date/Time: 11/23/2020/2:30:49 PM    Final  MONITORS  LONG TERM MONITOR (3-14 DAYS) 12/28/2020  Narrative  Basic rhythm is NSR with 1 degree AVB  Occasional asymptomatic Mobitz 1 second degree AVB  PVC's with burden ~ 3 %  Rare PAC's  No atrial fibrillation or other significant arrhythmia  Abnormal study with asymptomatic 2 degree AVB and PVC's with 3% burden.      Patch Wear Time:  6 days and 20 hours (2022-01-26T10:30:01-499 to 2022-02-02T07:11:25-0500)  Patient had a min HR of 33 bpm, max HR of 119 bpm, and avg HR of 83 bpm. Predominant underlying rhythm was Sinus Rhythm. First Degree AV Block was present. Second Degree AV Block-Mobitz I (Wenckebach) was present. Isolated SVEs were rare (<1.0%), SVE Couplets were rare (<1.0%), and SVE Triplets were rare  (<1.0%). Isolated VEs were occasional (2.9%, [id number]), VE Couplets were rare (<1.0%, 319), and VE Triplets were rare (<1.0%, 23). Ventricular Bigeminy and Trigeminy were present.   CT SCANS  CT CORONARY MORPH W/CTA COR W/SCORE 10/23/2020  Addendum 10/28/2020  1:18 PM ADDENDUM REPORT: 10/28/2020 13:15  CLINICAL DATA:  68 year old male with h/o severe aortic insufficiency scheduled for AVR.  EXAM: Cardiac/Coronary  CTA  TECHNIQUE: The patient was scanned on a [employer].  FINDINGS: A 100 kV prospective scan was triggered in the descending thoracic aorta at 111 HU's. Axial non-contrast 3 mm slices were carried out through the heart. The data set was analyzed on a dedicated work station and scored using the Agatson method. Gantry rotation speed was 250 msecs and collimation was .6 mm. No beta blockade and 0.8 mg of sl NTG was given. The 3D data set was reconstructed in 5% intervals of the 67-82 % of the R-R cycle. Diastolic phases were analyzed on a dedicated work station using MPR, MIP and VRT modes. The patient received 80 cc of contrast.  Aorta: Upper normal size of the ascending aorta with maximum diameter 40 mm. Minimal atherosclerotic plaque and calcifications. No dissection.  Sinotubular Junction: 34 x 34 mm  Ascending Thoracic Aorta: 40 x 39 mm  Aortic Arch: 31 x 29 mm  Descending Thoracic Aorta: 30 x 39 mm  Sinus of Valsalva Measurements:  Non-coronary: 37 mm  Right -coronary: 36 mm  Left -coronary: 36 mm  Aortic Valve: Trileaflet with thickened leaflets and central non-coaptation. No calcifications.  Coronary Arteries:  Normal coronary origin.  Right dominance.  RCA is a large dominant artery that gives rise to PDA and PLA. There are only minimal irregularities.  Left main is a large artery that gives rise to LAD and LCX arteries. Left main has only luminal irregularities.  LAD is a large vessel that gives rise to two large and one  small diagonal arteries. Proximal LAD has minimal eccentric calcified plaque with stenosis 0-25%.  D1,2,3 have no significant plaque.  LCX is a non-dominant artery that gives rise to one small OM1 branch. There is no plaque.  Other findings:  Normal pulmonary vein drainage into the left atrium.  Normal left atrial appendage without a thrombus.  Dilated pulmonary artery measuring 35 mm suggestive of pulmonary hypertension.  IMPRESSION: 1. Coronary calcium  score of 32. This was 27 percentile for age and sex matched control.  2. Normal coronary origin with right dominance.  3. CAD-RADS 1. 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 Consider preventive therapy and risk factor modification.  4. Upper normal size of the ascending aorta with maximum diameter 40 mm. Minimal atherosclerotic plaque and calcifications. No dissection.  5. Aortic Valve: Trileaflet with thickened leaflets and central non-coaptation. No calcifications.   Electronically Signed By: Christoper Crafts On: 10/28/2020 13:15  Narrative EXAM: OVER-READ INTERPRETATION  CT CHEST  The following report is an over-read performed by radiologist Dr. Alexandria Angel of [facility] on 10/23/2020. This over-read does not include interpretation of cardiac or coronary anatomy or pathology. The coronary calcium  score/coronary CTA interpretation by the cardiologist is attached.  COMPARISON:  None.  FINDINGS: Extracardiac findings will be described separately under dictation for contemporaneously obtained CTA chest.  IMPRESSION: Please see separate dictation for contemporaneously obtained CTA chest dated 10/23/2020 for full description of relevant extracardiac findings.  Electronically Signed: By: Alexandria Angel M.D. On: 10/24/2020 06:45     ______________________________________________________________________________________________       EKG:   04/07/2023: SR 1st HB rate 63  Recent Labs: 11/24/2023: BUN 24; Creatinine, Ser 1.88; Potassium 4.3; Sodium 141  Recent Lipid Panel    Component Value Date/Time   CHOL 147 11/05/2021 1259   TRIG 83 11/05/2021 1259   HDL 52 11/05/2021 1259   CHOLHDL 2.8 11/05/2021 1259   LDLCALC 79 11/05/2021 1259    Physical Exam:    VS:  BP 122/80 (BP Location: Right Arm)   Pulse 75   Ht 5\' 10"  (1.778 m)   Wt 222 lb 12.8 oz (101.1 kg)   SpO2 93%   BMI 31.97 kg/m     Wt Readings from Last 3 Encounters:  04/29/24 222 lb 12.8 oz (101.1 kg)  11/01/23 226 lb (102.5 kg)  04/07/23 221 lb (100.2 kg)    GEN:  No acute distress CARDIAC: No significant murmur; RRR no gallop, or edema. VASCULAR:  Normal Pulses.  RESPIRATORY:  Clear to auscultation without rales, wheezing or rhonchi  ABDOMEN: Soft, non-tender, non-distended MUSCULOSKELETAL: No deformity  SKIN: Warm and dry NEUROLOGIC:  Alert and oriented x 3 PSYCHIATRIC:  Normal affect   ASSESSMENT:    1. Dilated aortic root (HCC)   2. Essential hypertension   3. Chronic combined systolic and diastolic heart failure (HCC)   4. S/P AVR (aortic valve replacement)   5. Nonrheumatic aortic valve insufficiency   6. Mobitz (type) I (Wenckebach's) atrioventricular block   7. LVH (left ventricular hypertrophy)     PLAN:    Heart failure with Recovered LVEF HTN LVH with papillary muscle hypertrophy  - NYHA I - controlled on hydralazine  25 mg PO TID and HCTZ - he stop MRA; will keep off due to hypotension - would not start SGLT2i or ARNI; his LVEF has recovered s/p valve surgery - Heart failure with reduced ejection fraction is well-managed. Blood pressure is controlled on current medications. No new symptoms reported. Intolerance to ACE inhibitors, ARBs, ARNIs, and amlodipine  noted.  Mild aortic regurgitation Mild aortic regurgitation post-aortic valve replacement. No symptoms of congestive heart failure. Echocardiograms will  evaluate valve function and aortic size (he is  not amenable to yearly screening at this time due to cost)  Status post aortic valve replacement Status post aortic valve replacement in January 2022. Valve is functioning well with mild regurgitation. Discussed potential need for future intervention with potential for percutaneous valve replacement discussed to avoid open surgery.  Mild aortic dilation Mild aortic dilation with aorta measuring 42 mm on echocardiogram from April 2025. Current guidelines recommend annual echocardiograms due to aortic size. Discussed risks and benefits of monitoring versus cost considerations. He prefers to avoid frequent echocardiograms due to cost. Agreed to reassess based on symptoms and preference. - Schedule follow-up appointment in April 2026 to reassess need for echocardiogram based on symptoms and preference.  Chronic kidney disease, stage 3B Chronic kidney disease stage 3B is well-managed.  One year with me team   Medication Adjustments/Labs and Tests Ordered: Current medicines are reviewed at length with the patient today.  Concerns regarding medicines are outlined above.  Orders Placed This Encounter  Procedures   EKG 12-Lead   No orders of the defined types were placed in this encounter.   There are no Patient Instructions on file for this visit.   Signed, Eric Melody, MD  04/29/2024 11:38 AM    [facility]

## 2024-04-29 NOTE — Patient Instructions (Signed)
 Medication Instructions:  Your physician recommends that you continue on your current medications as directed. Please refer to the Current Medication list given to you today.  *If you need a refill on your cardiac medications before your next appointment, please call your pharmacy*  Lab Work: NONE  If you have labs (blood work) drawn today and your tests are completely normal, you will receive your results only by: MyChart Message (if you have MyChart) OR A paper copy in the mail If you have any lab test that is abnormal or we need to change your treatment, we will call you to review the results.  Testing/Procedures: NONE  Follow-Up: At [facility], you and your health needs are our priority.  As part of our continuing mission to provide you with exceptional heart care, our providers are all part of one team.  This team includes your primary Cardiologist (physician) and Advanced Practice Providers or APPs (Physician Assistants and Nurse Practitioners) who all work together to provide you with the care you need, when you need it.  Your next appointment:   12 month(s)  Provider:    Jann Melody, MD or One of our Advanced Practice Providers (APPs): Melita Springer, PA-C  Friddie Jetty, NP Evaline Hill, NP  Theotis Flake, PA-C Lawana Pray, NP  Willis Harter, PA-C Lovette Rud, PA-C  Hiller, PA-C Ernest Dick, NP  Marlana Silvan, NP Marcie Sever, PA-C  Laquita Plant, PA-C    Dayna Dunn, PA-C  Marlyse Single, PA-C Palmer Bobo, NP Katlyn West, NP Callie Goodrich, PA-C  Evan Williams, PA-C Sheng Haley, PA-C  Xika Zhao, NP Kathleen Johnson, PA-C

## 2024-05-16 ENCOUNTER — Ambulatory Visit: Payer: Managed Care, Other (non HMO) | Admitting: Internal Medicine

## 2024-07-06 ENCOUNTER — Other Ambulatory Visit: Payer: Self-pay | Admitting: Internal Medicine

## 2024-07-08 ENCOUNTER — Other Ambulatory Visit: Payer: Self-pay | Admitting: Internal Medicine

## 2024-11-09 ENCOUNTER — Other Ambulatory Visit: Payer: Self-pay | Admitting: Internal Medicine

## 2024-12-12 ENCOUNTER — Other Ambulatory Visit: Payer: Self-pay | Admitting: Internal Medicine

## 2024-12-19 NOTE — Telephone Encounter (Signed)
 In accordance with refill protocols, please review and address the following requirements before this medication refill can be authorized:  Labs
